# Patient Record
Sex: Female | Born: 1955
Health system: Southern US, Community
[De-identification: ages and names within clinical notes are randomized; demographics above are authoritative.]

## PROBLEM LIST (undated history)

## (undated) DIAGNOSIS — Z9189 Other specified personal risk factors, not elsewhere classified: Secondary | ICD-10-CM

## (undated) DIAGNOSIS — D126 Benign neoplasm of colon, unspecified: Secondary | ICD-10-CM

## (undated) DIAGNOSIS — M797 Fibromyalgia: Secondary | ICD-10-CM

## (undated) DIAGNOSIS — E559 Vitamin D deficiency, unspecified: Secondary | ICD-10-CM

## (undated) DIAGNOSIS — M5430 Sciatica, unspecified side: Secondary | ICD-10-CM

## (undated) DIAGNOSIS — M899 Disorder of bone, unspecified: Secondary | ICD-10-CM

## (undated) DIAGNOSIS — M949 Disorder of cartilage, unspecified: Secondary | ICD-10-CM

## (undated) HISTORY — DX: Sciatica, unspecified side: M54.30

## (undated) HISTORY — DX: Vitamin D deficiency, unspecified: E55.9

## (undated) HISTORY — DX: Fibromyalgia: M79.7

## (undated) HISTORY — DX: Benign neoplasm of colon, unspecified: D12.6

## (undated) HISTORY — DX: Disorder of bone, unspecified: M89.9

## (undated) HISTORY — DX: Disorder of cartilage, unspecified: M94.9

## (undated) HISTORY — DX: Other specified personal risk factors, not elsewhere classified: Z91.89

---
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
## 1998-12-18 ENCOUNTER — Other Ambulatory Visit: Admission: RE | Admit: 1998-12-18 | Discharge: 1998-12-18 | Payer: Self-pay | Admitting: Gynecology

## 2000-05-05 ENCOUNTER — Encounter: Payer: Self-pay | Admitting: Gynecology

## 2000-05-05 ENCOUNTER — Encounter: Admission: RE | Admit: 2000-05-05 | Discharge: 2000-05-05 | Payer: Self-pay | Admitting: Gynecology

## 2000-05-12 ENCOUNTER — Other Ambulatory Visit: Admission: RE | Admit: 2000-05-12 | Discharge: 2000-05-12 | Payer: Self-pay | Admitting: Gynecology

## 2001-06-21 ENCOUNTER — Other Ambulatory Visit: Admission: RE | Admit: 2001-06-21 | Discharge: 2001-06-21 | Payer: Self-pay | Admitting: Gynecology

## 2001-10-21 ENCOUNTER — Encounter: Admission: RE | Admit: 2001-10-21 | Discharge: 2001-10-21 | Payer: Self-pay | Admitting: Gynecology

## 2001-10-21 ENCOUNTER — Encounter: Payer: Self-pay | Admitting: Gynecology

## 2002-07-18 ENCOUNTER — Other Ambulatory Visit: Admission: RE | Admit: 2002-07-18 | Discharge: 2002-07-18 | Payer: Self-pay | Admitting: Gynecology

## 2002-10-24 ENCOUNTER — Encounter: Admission: RE | Admit: 2002-10-24 | Discharge: 2002-10-24 | Payer: Self-pay | Admitting: Gynecology

## 2002-10-24 ENCOUNTER — Encounter: Payer: Self-pay | Admitting: Gynecology

## 2004-04-28 HISTORY — PX: CERVICAL FUSION: SHX112

## 2004-05-30 ENCOUNTER — Other Ambulatory Visit: Admission: RE | Admit: 2004-05-30 | Discharge: 2004-05-30 | Payer: Self-pay | Admitting: Gynecology

## 2004-06-18 ENCOUNTER — Encounter: Admission: RE | Admit: 2004-06-18 | Discharge: 2004-06-18 | Payer: Self-pay | Admitting: Gynecology

## 2004-07-25 ENCOUNTER — Ambulatory Visit (HOSPITAL_COMMUNITY): Admission: RE | Admit: 2004-07-25 | Discharge: 2004-07-26 | Payer: Self-pay | Admitting: Orthopaedic Surgery

## 2005-07-03 ENCOUNTER — Other Ambulatory Visit: Admission: RE | Admit: 2005-07-03 | Discharge: 2005-07-03 | Payer: Self-pay | Admitting: Gynecology

## 2005-07-22 ENCOUNTER — Encounter: Admission: RE | Admit: 2005-07-22 | Discharge: 2005-07-22 | Payer: Self-pay | Admitting: Gynecology

## 2006-04-28 HISTORY — PX: OTHER SURGICAL HISTORY: SHX169

## 2006-07-20 ENCOUNTER — Other Ambulatory Visit: Admission: RE | Admit: 2006-07-20 | Discharge: 2006-07-20 | Payer: Self-pay | Admitting: Gynecology

## 2006-10-15 ENCOUNTER — Encounter: Admission: RE | Admit: 2006-10-15 | Discharge: 2006-10-15 | Payer: Self-pay | Admitting: Gynecology

## 2007-11-09 ENCOUNTER — Encounter: Admission: RE | Admit: 2007-11-09 | Discharge: 2007-11-09 | Payer: Self-pay | Admitting: Gynecology

## 2008-08-26 LAB — CONVERTED CEMR LAB: Pap Smear: NEGATIVE

## 2008-10-26 LAB — HM MAMMOGRAPHY: HM Mammogram: NORMAL

## 2008-11-27 ENCOUNTER — Encounter: Admission: RE | Admit: 2008-11-27 | Discharge: 2008-11-27 | Payer: Self-pay | Admitting: Gynecology

## 2008-11-29 ENCOUNTER — Encounter: Admission: RE | Admit: 2008-11-29 | Discharge: 2008-11-29 | Payer: Self-pay | Admitting: Gynecology

## 2009-01-12 ENCOUNTER — Ambulatory Visit: Payer: Self-pay | Admitting: Family Medicine

## 2009-08-09 ENCOUNTER — Ambulatory Visit: Payer: Self-pay | Admitting: Internal Medicine

## 2009-08-09 DIAGNOSIS — M5430 Sciatica, unspecified side: Secondary | ICD-10-CM

## 2009-08-09 DIAGNOSIS — Z9189 Other specified personal risk factors, not elsewhere classified: Secondary | ICD-10-CM | POA: Insufficient documentation

## 2009-08-09 DIAGNOSIS — M949 Disorder of cartilage, unspecified: Secondary | ICD-10-CM

## 2009-08-09 DIAGNOSIS — M899 Disorder of bone, unspecified: Secondary | ICD-10-CM | POA: Insufficient documentation

## 2009-08-09 HISTORY — DX: Sciatica, unspecified side: M54.30

## 2009-09-17 ENCOUNTER — Telehealth: Payer: Self-pay | Admitting: Internal Medicine

## 2009-09-18 ENCOUNTER — Telehealth: Payer: Self-pay | Admitting: Internal Medicine

## 2009-09-18 ENCOUNTER — Ambulatory Visit: Payer: Self-pay | Admitting: Internal Medicine

## 2009-09-19 ENCOUNTER — Ambulatory Visit: Payer: Self-pay | Admitting: Internal Medicine

## 2009-09-19 LAB — CONVERTED CEMR LAB
BUN: 15 mg/dL (ref 6–23)
Basophils Absolute: 0 10*3/uL (ref 0.0–0.1)
Basophils Relative: 0.6 % (ref 0.0–3.0)
CO2: 28 meq/L (ref 19–32)
CRP, High Sensitivity: 1.58 (ref 0.00–5.00)
Calcium: 9.1 mg/dL (ref 8.4–10.5)
Chloride: 105 meq/L (ref 96–112)
Cholesterol: 176 mg/dL (ref 0–200)
Creatinine, Ser: 0.7 mg/dL (ref 0.4–1.2)
Eosinophils Absolute: 0.1 10*3/uL (ref 0.0–0.7)
Eosinophils Relative: 1.8 % (ref 0.0–5.0)
GFR calc non Af Amer: 93.6 mL/min (ref >60)
Glucose, Bld: 74 mg/dL (ref 70–99)
HCT: 37.4 % (ref 36.0–46.0)
HDL: 81.1 mg/dL (ref >39.00)
Hemoglobin: 12.8 g/dL (ref 12.0–15.0)
LDL Cholesterol: 88 mg/dL (ref 0–99)
Lymphocytes Relative: 25.9 % (ref 12.0–46.0)
Lymphs Abs: 1.7 10*3/uL (ref 0.7–4.0)
MCHC: 34.3 g/dL (ref 30.0–36.0)
MCV: 91.7 fL (ref 78.0–100.0)
Monocytes Absolute: 0.6 10*3/uL (ref 0.1–1.0)
Monocytes Relative: 10.1 % (ref 3.0–12.0)
Neutro Abs: 3.9 10*3/uL (ref 1.4–7.7)
Neutrophils Relative %: 61.6 % (ref 43.0–77.0)
Platelets: 160 10*3/uL (ref 150.0–400.0)
Potassium: 4.1 meq/L (ref 3.5–5.1)
RBC: 4.08 M/uL (ref 3.87–5.11)
RDW: 13.9 % (ref 11.5–14.6)
Sodium: 140 meq/L (ref 135–145)
TSH: 2.31 microintl units/mL (ref 0.35–5.50)
Total CHOL/HDL Ratio: 2
Triglycerides: 34 mg/dL (ref 0.0–149.0)
VLDL: 6.8 mg/dL (ref 0.0–40.0)
WBC: 6.4 10*3/uL (ref 4.5–10.5)

## 2009-09-21 ENCOUNTER — Encounter (INDEPENDENT_AMBULATORY_CARE_PROVIDER_SITE_OTHER): Payer: Self-pay | Admitting: *Deleted

## 2009-10-24 ENCOUNTER — Encounter (INDEPENDENT_AMBULATORY_CARE_PROVIDER_SITE_OTHER): Payer: Self-pay | Admitting: *Deleted

## 2009-10-26 ENCOUNTER — Ambulatory Visit: Payer: Self-pay | Admitting: Internal Medicine

## 2009-11-09 ENCOUNTER — Ambulatory Visit: Payer: Self-pay | Admitting: Internal Medicine

## 2009-11-09 LAB — HM COLONOSCOPY

## 2009-11-12 ENCOUNTER — Encounter: Payer: Self-pay | Admitting: Internal Medicine

## 2009-12-03 ENCOUNTER — Encounter: Admission: RE | Admit: 2009-12-03 | Discharge: 2009-12-03 | Payer: Self-pay | Admitting: Gynecology

## 2009-12-13 ENCOUNTER — Telehealth: Payer: Self-pay | Admitting: Internal Medicine

## 2009-12-19 ENCOUNTER — Telehealth: Payer: Self-pay | Admitting: Internal Medicine

## 2010-01-15 ENCOUNTER — Encounter: Payer: Self-pay | Admitting: Internal Medicine

## 2010-03-05 ENCOUNTER — Telehealth: Payer: Self-pay | Admitting: Internal Medicine

## 2010-04-15 ENCOUNTER — Telehealth: Payer: Self-pay | Admitting: Internal Medicine

## 2010-05-09 ENCOUNTER — Ambulatory Visit
Admission: RE | Admit: 2010-05-09 | Discharge: 2010-05-09 | Payer: Self-pay | Source: Home / Self Care | Attending: Internal Medicine | Admitting: Internal Medicine

## 2010-05-13 ENCOUNTER — Encounter: Payer: Self-pay | Admitting: Internal Medicine

## 2010-05-19 ENCOUNTER — Encounter: Payer: Self-pay | Admitting: Gynecology

## 2010-05-20 ENCOUNTER — Encounter: Payer: Self-pay | Admitting: Gynecology

## 2010-05-28 NOTE — Letter (Signed)
Summary: Patient Notice- Polyp Results  [facility]  [address] [place], [place] [address]   Phone: [phone]  Fax: [phone]        November 12, 2009 MRN: [id number]    Kathy King [address] [place], [place]  [address]    Dear Ms. Bethea,  I am pleased to inform you that the colon polyp(s) removed during your recent colonoscopy was (were) found to be benign (no cancer detected) upon pathologic examination.The polyp was adenomatous ( precancerous)  I recommend you have a repeat colonoscopy examination in 5_ years to look for recurrent polyps, as having colon polyps increases your risk for having recurrent polyps or even colon cancer in the future.  Should you develop new or worsening symptoms of abdominal pain, bowel habit changes or bleeding from the rectum or bowels, please schedule an evaluation with either your primary care physician or with me.  Additional information/recommendations:  _x_ No further action with gastroenterology is needed at this time. Please      follow-up with your primary care physician for your other healthcare      needs.  __ Please call [phone] to schedule a return visit to review your      situation.  __ Please keep your follow-up visit as already scheduled.  __ Continue treatment plan as outlined the day of your exam.  Please call us if you are having persistent problems or have questions about your condition that have not been fully answered at this time.  Sincerely,  Hart Carwin MD  This letter has been electronically signed by your physician.  Appended Document: Patient Notice- Polyp Results letter mailed.

## 2010-05-28 NOTE — Procedures (Signed)
Summary: Colonoscopy  Patient: [name] Note: All result statuses are Final unless otherwise noted.  Tests: (1) Colonoscopy (COL)   COL Colonoscopy           DONE     [facility]     [address]     [place], [place]  [address]           COLONOSCOPY PROCEDURE REPORT           PATIENT:  Kathy King, Kathy King  MR#:  [id number]     BIRTHDATE:  1955/05/14, 53 yrs. old  GENDER:  female     ENDOSCOPIST:  Hedwig Morton. Juanda Chance, MD     REF. BY:  Rene Paci, M.D.     PROCEDURE DATE:  11/09/2009     PROCEDURE:  Colonoscopy [address]     ASA CLASS:  Class I     INDICATIONS:  Routine Risk Screening     MEDICATIONS:   Versed 12 mg, Fentanyl 125 mcg           DESCRIPTION OF PROCEDURE:   After the risks benefits and     alternatives of the procedure were thoroughly explained, informed     consent was obtained.  Digital rectal exam was performed and     revealed no rectal masses.   The LB CF-[id number] endoscope     was introduced through the anus and advanced to the cecum, which     was identified by both the appendix and ileocecal valve, without     limitations.  The quality of the prep was good, using MiraLax.     The instrument was then slowly withdrawn as the colon was fully     examined.     <<PROCEDUREIMAGES>>           FINDINGS:  A diminutive polyp was found. 3 mm sessile polyp at 25     cm The polyp was removed using cold biopsy forceps (see image6 and     image5).  This was otherwise a normal examination of the colon     (see image1, image2, image3, image4, and image7).   Retroflexed     views in the rectum revealed no abnormalities.    The scope was     then withdrawn from the patient and the procedure completed.           COMPLICATIONS:  None     ENDOSCOPIC IMPRESSION:     1) Diminutive polyp     2) Otherwise normal examination     RECOMMENDATIONS:     1) Await pathology results     2) High fiber diet.     REPEAT EXAM:  In 10 year(s) for.        ______________________________     Hedwig Morton. Juanda Chance, MD           CC:           n.     eSIGNED:   Hedwig Morton. Brodie at 11/09/2009 11:09 AM           Pianka, Angeleah, [place] [phone]  Note: An exclamation mark (!) indicates a result that was not dispersed into the flowsheet. Document Creation Date: 11/09/2009 11:10 AM _______________________________________________________________________  (1) Order result status: Final Collection or observation date-time: 11/09/2009 11:04 Requested date-time:  Receipt date-time:  Reported date-time:  Referring Physician:   Ordering Physician: Lina Sar [phone]) Specimen Source:  Source: Launa Grill Order Number: [id number] Lab site:   Appended Document: Colonoscopy  Procedures Next Due Date:    Colonoscopy: 10/2019  Appended Document: Colonoscopy     Procedures Next Due Date:    Colonoscopy: 10/2014

## 2010-05-28 NOTE — Progress Notes (Signed)
Summary: Sinus Inf?  Phone Note Call from Patient Call back at Work Phone [phone]   Caller: Patient Summary of Call: Pt c/o congestion for 1 1/2 weeks. The pt has been taking mucinex and benadryl and has had mild improvement but have with sinus headache and has seen some blood when blowing nose. The pt thinks she may have a sinus infection and is requesting [facility]X to [facility] Initial call taken by: Margaret Pyle, CMA,  March 05, 2010 9:06 AM  Follow-up for Phone Call        use Zpack - erx done Follow-up by: Newt Lukes MD,  March 05, 2010 9:56 AM  Additional Follow-up for Phone Call Additional follow up Details #1::        Pt informed Additional Follow-up by: Margaret Pyle, CMA,  March 05, 2010 9:58 AM    New/Updated Medications: AZITHROMYCIN 250 MG TABS (AZITHROMYCIN) 2 tabs by mouth today, then 1 by mouth daily starting tomorrow Prescriptions: AZITHROMYCIN 250 MG TABS (AZITHROMYCIN) 2 tabs by mouth today, then 1 by mouth daily starting tomorrow  #6 x 0   Entered and Authorized by:   Newt Lukes MD   Signed by:   Newt Lukes MD on 03/05/2010   Method used:   Electronically to        [facility] [phone][facility] (retail)       [address]       [place], [place]  [address]       Ph: [phone] or [phone]       Fax: [phone]   RxID:   [id number]

## 2010-05-28 NOTE — Miscellaneous (Signed)
Summary: [facility]  Clinical Lists Changes  Medications: Added new medication of MIRALAX   POWD (POLYETHYLENE GLYCOL 3350) As per prep  instructions. - Signed Added new medication of REGLAN 10 MG  TABS (METOCLOPRAMIDE HCL) As per prep instructions. - Signed Added new medication of DULCOLAX 5 MG  TBEC (BISACODYL) Day before procedure take 2 at 3pm and 2 at 8pm. - Signed Rx of MIRALAX   POWD (POLYETHYLENE GLYCOL 3350) As per prep  instructions.;  #255gm x 0;  Signed;  Entered by: Ezra Sites RN;  Authorized by: Hart Carwin MD;  Method used: Electronically to [facility] [address] [place], [place], [place]  [address], Ph: [phone], Fax: [phone] Rx of REGLAN 10 MG  TABS (METOCLOPRAMIDE HCL) As per prep instructions.;  #2 x 0;  Signed;  Entered by: Ezra Sites RN;  Authorized by: Hart Carwin MD;  Method used: Electronically to [facility] [address] [place], [place], [place]  [address], Ph: [phone], Fax: [phone] Rx of DULCOLAX 5 MG  TBEC (BISACODYL) Day before procedure take 2 at 3pm and 2 at 8pm.;  #4 x 0;  Signed;  Entered by: Ezra Sites RN;  Authorized by: Hart Carwin MD;  Method used: Electronically to [facility] [address] [place], [place], [place]  [address], Ph: [phone], Fax: [phone] Allergies: Added new allergy or adverse reaction of SULFA Observations: Added new observation of NKA: F (10/26/2009 12:45)    Prescriptions: DULCOLAX 5 MG  TBEC (BISACODYL) Day before procedure take 2 at 3pm and 2 at 8pm.  #4 x 0   Entered by:   Ezra Sites RN   Authorized by:   Hart Carwin MD   Signed by:   Ezra Sites RN on 10/26/2009   Method used:   Electronically to        [facility] (retail)       [address]       [place], [place]  [address]       Ph: [phone]       Fax: [phone]   RxID:   [id number] REGLAN 10 MG  TABS (METOCLOPRAMIDE HCL) As per prep  instructions.  #2 x 0   Entered by:   Ezra Sites RN   Authorized by:   Hart Carwin MD   Signed by:   Ezra Sites RN on 10/26/2009   Method used:   Electronically to        [facility] (retail)       [address]       [place], [place]  [address]       Ph: [phone]       Fax: [phone]   RxID:   [id number] MIRALAX   POWD (POLYETHYLENE GLYCOL 3350) As per prep  instructions.  #255gm x 0   Entered by:   Ezra Sites RN   Authorized by:   Hart Carwin MD   Signed by:   Ezra Sites RN on 10/26/2009   Method used:   Electronically to        [facility] (retail)       [address]       [place], [place]  [address]       Ph: [phone]  Fax: [phone]   RxID:   [id number]

## 2010-05-28 NOTE — Progress Notes (Signed)
Summary: pred pak and Pt refer  Phone Note Other Incoming   Summary of Call: xrays reviewed - no DDD or DJD evident in l-spine or left hip -  try pred pak and PT eval/tx for same - pt understands and agrees Initial call taken by: Newt Lukes MD,  Sep 18, 2009 4:56 PM    New/Updated Medications: PREDNISONE (PAK) 10 MG TABS (PREDNISONE) as directed x 6 days Prescriptions: PREDNISONE (PAK) 10 MG TABS (PREDNISONE) as directed x 6 days  #1 x 0   Entered and Authorized by:   Newt Lukes MD   Signed by:   Newt Lukes MD on 09/18/2009   Method used:   Electronically to        [facility] (retail)       [address]       [place], [place]  [address]       Ph: [phone]       Fax: [phone]   RxID:   [id number]

## 2010-05-28 NOTE — Letter (Signed)
Summary: [facility]  [facility]   Imported By: Lennie Odor 02/18/2010 11:20:21  _____________________________________________________________________  External Attachment:    Type:   Image     Comment:   External Document

## 2010-05-28 NOTE — Progress Notes (Signed)
Summary: labs  Phone Note Call from Patient   Caller: Patient Summary of Call: Have script from Dr. Nicholas Lose to have labs done. Pt is requesting to have labs done here. will fax results to Dr. Nicholas Lose [phone]. Per Dr. Felicity Coyer ok to put idx Initial call taken by: Orlan Leavens,  Sep 17, 2009 12:45 PM

## 2010-05-28 NOTE — Progress Notes (Signed)
Summary: Nsurg refer for hip-leg pain  Phone Note Call from Patient   Caller: Patient Reason for Call: Referral Summary of Call: Pt is requesting referral for hip Initial call taken by: Orlan Leavens RMA,  December 19, 2009 8:35 AM  Follow-up for Phone Call        noted - refer to Nsurg as discussed in person to help differentiate and tx hip vs back source of pain  Follow-up by: Newt Lukes MD,  December 19, 2009 9:40 AM

## 2010-05-28 NOTE — Assessment & Plan Note (Signed)
Summary: left hip pain/cd   Vital Signs:  Patient profile:   55 year old female Height:      64 inches (162.56 cm) Weight:      153 pounds (69.55 kg) BMI:     26.36 O2 Sat:      95 % on Room air Temp:     98.0 degrees F (36.67 degrees C) oral Pulse rate:   75 / minute BP sitting:   114 / 68  (left arm) Cuff size:   regular  Vitals Entered By: Orlan Leavens (August 09, 2009 10:52 AM)  O2 Flow:  Room air CC: New patient. Been having (L) hip pain x's 1 week, Lower Extremity Joint pain Is Patient Diabetic? No Pain Assessment Patient in pain? yes     Location: (L) Hip Type: dull Onset of pain  pt states been having pain for about a year, but since the last week has gotten worse   Primary Care Provider:  Newt Lukes MD  CC:  New patient. Been having (L) hip pain x's 1 week and Lower Extremity Joint pain.  History of Present Illness: new pt to me, here to est care -   Lower Extremity Joint Pain      This is a 55 year old woman who presents with Lower Extremity Joint pain.  The symptoms began 1 week ago.  The severity is described as moderate.  hx lift hip and lateral thigh/upper buttock pain x 12mo since getting dog (aggrevated by walking 50# dog), worse in past 1 week. Exac by mopping activity. Pain worset with sitting or leaning at waist to left side. Pain better with standing or walking. No pain lying on left hip in bed.  The patient reports popping and stiffness for >1 hr, but denies redness, giving away, decreased ROM, and weakness.  The pain is located in the left hip.  The pain began suddenly and with no injury.  The pain is described as dull, aching, and occuring at rest.  Evaluation to date has included no evaluation.  No relief of pain with 600mg  ibuprofen x 1 dose (casued stomach upset). The patient denies the following symptoms: fever and dysuria.    Preventive Screening-Counseling & Management  Alcohol-Tobacco     Alcohol drinks/day: 0     Alcohol Counseling: not  indicated; patient does not drink     Smoking Status: never     Tobacco Counseling: not indicated; no tobacco use  Caffeine-Diet-Exercise     Caffeine Counseling: not indicated; caffeine use is not excessive or problematic     Does Patient Exercise: yes     Type of exercise: walk     Depression Counseling: not indicated; screening negative for depression  Safety-Violence-Falls     Seat Belt Use: yes     Seat Belt Counseling: not indicated; patient wears seat belts     Helmet Use: yes     Helmet Counseling: not indicated; patient wears helmet when riding bicycle/motocycle     Firearms in the Home: no firearms in the home     Firearm Counseling: not applicable     Smoke Detectors: yes     Smoke Detector Counseling: n/a     Violence in the Home: no risk noted     Violence Counseling: not applicable     Fall Risk Counseling: not indicated; no significant falls noted  Clinical Review Panels:  Prevention   Last Mammogram:  No specific mammographic evidence of malignancy.   (10/26/2008)  Last Pap Smear:  Interpretation/Result:Negative for intraepithelial Lesion or Malignancy.    (08/26/2008)  Immunizations   Last Tetanus Booster:  Historical (04/28/2006)   Last Flu Vaccine:  Historical (01/26/2009)   Current Medications (verified): 1)  Vitamin D 2000 Unit Caps (Cholecalciferol) .... Take 1 By Mouth Qd 2)  Calcium 500 Mg Tabs (Calcium) .... 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    Physical Exam  General:  alert, well-developed, well-nourished, and cooperative to examination.    Lungs:  normal respiratory effort, no intercostal retractions or use of accessory muscles; normal breath sounds bilaterally - no crackles and no wheezes.    Heart:  normal rate, regular rhythm, no murmur, and no rub. BLE without edema. Msk:  back: full range of motion of lumbar spine. Nontender to palpation over vertebral region. Negative straight leg raise. Deep tendon reflexes symmetrically intact at Achilles and patella, negative clonus. Sensation intact throughout all dermatomes in bilateral lower extremities. Full strength to manual muscle testing in all major muscule groups including EHL, anterior tibialis, gastrocnemius, quadriceps, and iliopsoas. Able to heel and toe walk without difficulty and ambulates with a normal gait.  Pain to palpation over left buttock and piriformis region.   Impression & Recommendations:  Problem # 1:  SCIATICA, LEFT (ICD-724.3)  piriformis syndrome apparent on hx and exam - current intensity of symptoms present < 2 weeks - begin with NSAIDs and stretches - consider formal PT as needed  f/u if unimproved or worse to consider alt tx and eval - consider lumbar MRI if not improving  Her updated medication list for this problem includes:    Meloxicam 15 Mg Tabs (Meloxicam) .Marland Kitchen... 1 by mouth once daily x 14days, then as needed for pain  Discussed use of modified activities, medications, and stretching/strengthening exercises. Back care instructions given. To be seen in 2 weeks if no improvement; sooner if worsening of symptoms.   Orders: Prescription Created  Electronically [phone])  Problem # 2:  OSTEOPENIA (ICD-733.90)  Complete Medication List: 1)  Vitamin D 2000 Unit Caps (Cholecalciferol) .... Take 1 by mouth qd 2)  Calcium 500 Mg Tabs (Calcium) .... Take 1 by mouth qd 3)  Meloxicam 15 Mg Tabs (Meloxicam) .Marland Kitchen.. 1 by mouth once daily x 14days, then as needed for pain  Patient Instructions: 1)  it was good to see you today. 2)  meloxicam as discussed for pain - your prescription has been electronically submitted to your pharmacy. Please take as directed. Contact our office if you believe you're having problems with the medication(s).  3)  stretches as discussed to help with the tightness (will give PT handouts) Prescriptions: MELOXICAM 15 MG TABS (MELOXICAM) 1 by mouth once daily x 14days, then as needed for pain  #30 x 1  Entered and Authorized by:   Newt Lukes MD   Signed by:   Newt Lukes MD on 08/09/2009   Method used:   Electronically to        [facility] (retail)       [address]       [place], [place]  [address]       Ph: [phone]       Fax: [phone]   RxID:   [id number]    Pap Smear  Procedure date:  08/26/2008  Findings:      Interpretation/Result:Negative for intraepithelial Lesion or Malignancy.     Mammogram  Procedure date:  10/26/2008  Findings:      No specific mammographic evidence of malignancy.      Immunization History:  Tetanus/Td Immunization History:    Tetanus/Td:  historical (04/28/2006)  Influenza Immunization History:    Influenza:  historical (01/26/2009)

## 2010-05-28 NOTE — Letter (Signed)
Summary: Previsit letter  [facility]  [address] [place], [place] [address]   Phone: [phone]  Fax: [phone]       09/21/2009 MRN: [id number]  Kathy King [address] [place], [place]  [address]  Dear Kathy King,  Welcome to the Gastroenterology Division at [facility].    You are scheduled to see a nurse for your pre-procedure visit on 10-26-09 at 1pm on the 3rd floor at [facility], [address]  We ask that you try to arrive at our office 15 minutes prior to your appointment time to allow for check-in.  Your nurse visit will consist of discussing your medical and surgical history, your immediate family medical history, and your medications.    Please bring a complete list of all your medications or, if you prefer, bring the medication bottles and we will list them.  We will need to be aware of both prescribed and over the counter drugs.  We will need to know exact dosage information as well.  If you are on blood thinners (Coumadin, Plavix, Aggrenox, Ticlid, etc.) please call our office today/prior to your appointment, as we need to consult with your physician about holding your medication.   Please be prepared to read and sign documents such as consent forms, a financial agreement, and acknowledgement forms.  If necessary, and with your consent, a friend or relative is welcome to sit-in on the nurse visit with you.  Please bring your insurance card so that we may make a copy of it.  If your insurance requires a referral to see a specialist, please bring your referral form from your primary care physician.  No co-pay is required for this nurse visit.     If you cannot keep your appointment, please call [phone] to cancel or reschedule prior to your appointment date.  This allows [place] the opportunity to schedule an appointment for another patient in need of care.    Thank you for choosing [facility] for your medical needs.  We  appreciate the opportunity to care for you.  Please visit [place] at our website  to learn more about our practice.                     Sincerely.                                                                                                                   The Gastroenterology Division

## 2010-05-28 NOTE — Progress Notes (Signed)
Summary: LT hip pain  Phone Note Call from Patient Call back at Work Phone [phone]   Caller: Patient Summary of Call: Pt is requesting a Prednisone dose pack and possibly a Depo-Medrol injection. Pt is having pain in LT hip.  Initial call taken by: Margaret Pyle, CMA,  December 13, 2009 8:49 AM  Follow-up for Phone Call        pred pak - erx done Follow-up by: Newt Lukes MD,  December 13, 2009 12:05 PM    New/Updated Medications: PREDNISONE (PAK) 10 MG TABS (PREDNISONE) as directed x 6 days Prescriptions: PREDNISONE (PAK) 10 MG TABS (PREDNISONE) as directed x 6 days  #1 x 0   Entered by:   Brenton Grills MA   Authorized by:   Newt Lukes MD   Signed by:   Brenton Grills MA on 12/13/2009   Method used:   [facility]        [facility] [phone][facility]ail[facility]       [address]       [place], [place]  [address]       Ph: [phone] or [phone]       Fax: [phone]   RxID:   [id number] PREDNISONE (PAK) 10 MG TABS (PREDNISONE) as directed x 6 days  #1 x 0   Entered and Authorized by:   Newt Lukes MD   Signed by:   Newt Lukes MD on 12/13/2009   Method used:   Electronically to        [facility] (retail)       [address]       [place], [place]  [address]       Ph: [phone]       Fax: [phone]   RxID:   [id number]

## 2010-05-28 NOTE — Letter (Signed)
Summary: Miralax Instructions  [facility]  [address]   [place], [place] [address]   Phone: [phone]  Fax: [phone]       LAJEANA STROUGH    1955/08/24    MRN: [id number]       Procedure Day Dorna Bloom: Friday, 11-09-09     Arrival Time: 9:30 a.m.     Procedure Time: 10:30 a.m.     Location of Procedure:                    x   [facility] (4th Floor)   PREPARATION FOR COLONOSCOPY WITH MIRALAX  Starting 5 days prior to your procedure 11-04-09  do not eat nuts, seeds, popcorn, corn, beans, peas,  salads, or any raw vegetables.  Do not take any fiber supplements (e.g. Metamucil, Citrucel, and Benefiber). ____________________________________________________________________________________________________   THE DAY BEFORE YOUR PROCEDURE         DATE: 11-08-09  DAY: Thursday  1   Drink clear liquids the entire day-NO SOLID FOOD  2   Do not drink anything colored red or purple.  Avoid juices with pulp.  No orange juice.  3   Drink at least 64 oz. (8 glasses) of fluid/clear liquids during the day to prevent dehydration and help the prep work efficiently.  CLEAR LIQUIDS INCLUDE: Water Jello Ice Popsicles Tea (sugar ok, no milk/cream) Powdered fruit flavored drinks Coffee (sugar ok, no milk/cream) Gatorade Juice: apple, white grape, white cranberry  Lemonade Clear bullion, consomm, broth Carbonated beverages (any kind) Strained chicken noodle soup Hard Candy  4   Mix the entire bottle of Miralax with 64 oz. of Gatorade/Powerade in the morning and put in the refrigerator to chill.  5   At 3:00 pm take 2 Dulcolax/Bisacodyl tablets.  6   At 4:30 pm take one Reglan/Metoclopramide tablet.  7  Starting at 5:00 pm drink one 8 oz glass of the Miralax mixture every 15-20 minutes until you have finished drinking the entire 64 oz.  You should finish drinking prep around 7:30 or 8:00 pm.  8   If you are nauseated, you may take the 2nd Reglan/Metoclopramide  tablet at 6:30 pm.        9    At 8:00 pm take 2 more DULCOLAX/Bisacodyl tablets.     THE DAY OF YOUR PROCEDURE      DATE:   11-09-09  DAY: Thursday  You may drink clear liquids until 8:30 a.m.  (2 HOURS BEFORE PROCEDURE).   MEDICATION INSTRUCTIONS  Unless otherwise instructed, you should take regular prescription medications with a small sip of water as early as possible the morning of your procedure.           OTHER INSTRUCTIONS  You will need a responsible adult at least 53 years of age to accompany you and drive you home.   This person must remain in the waiting room during your procedure.  Wear loose fitting clothing that is easily removed.  Leave jewelry and other valuables at home.  However, you may wish to bring a book to read or an iPod/MP3 player to listen to music as you wait for your procedure to start.  Remove all body piercing jewelry and leave at home.  Total time from sign-in until discharge is approximately 2-3 hours.  You should go home directly after your procedure and rest.  You can resume normal activities the day after your procedure.  The day of your procedure you should not:  Drive   Make legal decisions   Operate machinery   Drink alcohol   Return to work  You will receive specific instructions about eating, activities and medications before you leave.   The above instructions have been reviewed and explained to me by   Ezra Sites RN  October 26, 2009 1:03 PM    I fully understand and can verbalize these instructions _____________________________ Date _______

## 2010-05-28 NOTE — Progress Notes (Signed)
Summary: left hip/back xrays  Phone Note Call from Patient   Summary of Call: continued left leg/hip/buttock pain - not improved after 30d meloxicam - ?xray --- ordered hip and l-spine to look for DJD or DDD Initial call taken by: Newt Lukes MD,  Sep 17, 2009 5:10 PM  Follow-up for Phone Call        pt aware. entered in idx Follow-up by: Orlan Leavens,  Sep 18, 2009 8:06 AM

## 2010-05-30 NOTE — Progress Notes (Signed)
Summary: Hip pain  Phone Note Call from Patient Call back at Home Phone [phone]   Caller: Patient Summary of Call: Pt c/o of intense LT hip pain that radiated down leg to knee and has have some numbness of her LT toes. Pt is requesting MD advisement, possible steroid injection and pred pak or PT. Initial call taken by: Margaret Pyle, CMA,  April 15, 2010 8:51 AM  Follow-up for Phone Call        pred pak erx done - if pt willing to go back for another PT visit, i will be happy to make referral - either to connie or [facility] PT, just let me know - thanks Follow-up by: Newt Lukes MD,  April 15, 2010 1:09 PM  Additional Follow-up for Phone Call Additional follow up Details #1::        Notified pt with md recommendations Additional Follow-up by: Orlan Leavens RMA,  April 15, 2010 1:28 PM    New/Updated Medications: PREDNISONE (PAK) 10 MG TABS (PREDNISONE) as directed x 6 days Prescriptions: PREDNISONE (PAK) 10 MG TABS (PREDNISONE) as directed x 6 days  #1 pak x 0   Entered and Authorized by:   Newt Lukes MD   Signed by:   Newt Lukes MD on 04/15/2010   Method used:   Electronically to        [facility] (retail)       [address]       [place], [place]  [address]       Ph: [phone]       Fax: [phone]   RxID:   [id number]   Appended Document: Hip pain - PT refer done pt also requests repeat PT eval - order placed - apt scheduled with connie 05/13/10 at 10am Newt Lukes MD  April 16, 2010 4:44 PM   Clinical Lists Changes  Orders: Added new Referral order of Physical Therapy Referral (PT) - Signed

## 2010-05-30 NOTE — Assessment & Plan Note (Signed)
Summary: ACU/R SHOULDER PAIN/JSS   Vital Signs:  Patient profile:   55 year old female Height:      64 inches (162.56 cm) Weight:      156 pounds (70.91 kg) BMI:     26.87 O2 Sat:      98 % on Room air Temp:     98.0 degrees F (36.67 degrees C) oral Pulse rate:   65 / minute BP sitting:   118 / 72  (left arm) Cuff size:   regular  Vitals Entered By: Orlan Leavens RMA (May 09, 2010 11:44 AM)  O2 Flow:  Room air CC: (R) shoulder pain Is Patient Diabetic? No Pain Assessment Patient in pain? yes     Location: (R) shoulder Type: aching   Primary Care Provider:  Newt Lukes MD  CC:  (R) shoulder pain.  History of Present Illness: c/o right shoudler and neck pain onset 48h ago progressive symptoms - unable to sleep at night due to pain no injury or overuse of neck of shoulder ++exac by recent stress  Current Medications (verified): 1)  Vitamin D 2000 Unit Caps (Cholecalciferol) .... Take 1 By Mouth Qd 2)  Calcium 500 Mg Tabs (Calcium) .... Take 1 By Mouth Qd 3)  Meloxicam 15 Mg Tabs (Meloxicam) .Marland Kitchen.. 1 By Mouth Once Daily X 14days, Then As Needed For Pain  Allergies (verified): 1)  ! Sulfa  Past History:  Past Medical History: osteopenia cervical spine DDD s/p fusion 2006  MD roster: gyn - [facility] -[facility] -[facility]  Review of Systems  The patient denies chest pain and headaches.    Physical Exam  General:  alert, well-developed, well-nourished, and cooperative to examination.    Msk:  myofascial spasm over right trap/supra scapular region, tender to palp - right shoulder: Full range of motion. full strength with testing rotator cuff. Negative impingement signs. Neurovascularly intact    Impression & Recommendations:  Problem # 1:  MUSCLE SPASM (ICD-728.85)  myofascial spasm over right trap - neck and shoulder pain resume meloxicam and take flexeril as needed - erx done  Orders: Prescription Created Electronically  [phone])  Complete Medication List: 1)  Vitamin D 2000 Unit Caps (Cholecalciferol) .... Take 1 by mouth qd 2)  Calcium 500 Mg Tabs (Calcium) .... Take 1 by mouth qd 3)  Meloxicam 15 Mg Tabs (Meloxicam) .Marland Kitchen.. 1 by mouth once daily x 14days, then as needed for pain 4)  Flexeril 10 Mg Tabs (Cyclobenzaprine hcl) .... 1/2-1 by mouth three times a day as needed for muscle spasm  Patient Instructions: 1)  it was good to see you today. 2)  pain is from muscle spasm - shoulder is ok 3)  use meloxicam as before and flexeril for muscle spasm (may cause sedation) - your prescriptions have been electronically submitted to your pharmacy. Please take as directed. Contact our office if you believe you're having problems with the medication(s).  Prescriptions: MELOXICAM 15 MG TABS (MELOXICAM) 1 by mouth once daily x 14days, then as needed for pain  #30 x 1   Entered and Authorized by:   Newt Lukes MD   Signed by:   Newt Lukes MD on 05/09/2010   Method used:   Electronically to        [facility] [phone][facility] (retail)       [address]       [place], [place]  [address]  Ph: [phone] or [phone]       Fax: [phone]   RxID:   [id number] FLEXERIL 10 MG TABS (CYCLOBENZAPRINE HCL) 1/2-1 by mouth three times a day as needed for muscle spasm  #30 x 1   Entered and Authorized by:   Newt Lukes MD   Signed by:   Newt Lukes MD on 05/09/2010   Method used:   Electronically to        [facility] [phone][facility] (retail)       [address]       [place], [place]  [address]       Ph: [phone] or [phone]       Fax: [phone]   RxID:   [id number]    Orders Added: 1)  Est. Patient Level III [557[address]] 2)  Prescription Created Electronically [phone]

## 2010-05-30 NOTE — Miscellaneous (Signed)
Summary: Evaluation/[facility]  Evaluation/[facility]   Imported By: Lester [place] 05/22/2010 09:27:34  _____________________________________________________________________  External Attachment:    Type:   Image     Comment:   External Document

## 2010-06-17 ENCOUNTER — Encounter: Payer: Self-pay | Admitting: Internal Medicine

## 2010-06-25 NOTE — Miscellaneous (Signed)
Summary: [facility]  D/C PT/[facility]   Imported By: Lester [place] 06/21/2010 08:31:41  _____________________________________________________________________  External Attachment:    Type:   Image     Comment:   External Document

## 2010-09-17 ENCOUNTER — Other Ambulatory Visit: Payer: Self-pay | Admitting: Gynecology

## 2010-11-14 ENCOUNTER — Other Ambulatory Visit: Payer: Self-pay | Admitting: *Deleted

## 2010-11-14 ENCOUNTER — Encounter: Payer: Self-pay | Admitting: Internal Medicine

## 2010-11-14 MED ORDER — CYCLOBENZAPRINE HCL 10 MG PO TABS: 10.0000 mg | ORAL_TABLET | Freq: Three times a day (TID) | ORAL | Status: DC | PRN

## 2010-11-14 MED ORDER — MELOXICAM 15 MG PO TABS: 15.0000 mg | ORAL_TABLET | Freq: Every day | ORAL | Status: DC | PRN

## 2010-11-21 ENCOUNTER — Other Ambulatory Visit: Payer: Self-pay | Admitting: Gynecology

## 2010-11-21 DIAGNOSIS — Z1231 Encounter for screening mammogram for malignant neoplasm of breast: Secondary | ICD-10-CM

## 2010-12-09 ENCOUNTER — Ambulatory Visit
Admission: RE | Admit: 2010-12-09 | Discharge: 2010-12-09 | Disposition: A | Discharge status: Home / Self Care | Payer: 59 | Source: Ambulatory Visit | Attending: Gynecology | Admitting: Gynecology

## 2010-12-09 DIAGNOSIS — Z1231 Encounter for screening mammogram for malignant neoplasm of breast: Secondary | ICD-10-CM

## 2011-02-24 ENCOUNTER — Other Ambulatory Visit: Payer: Self-pay | Admitting: Internal Medicine

## 2011-02-24 DIAGNOSIS — W57XXXA Bitten or stung by nonvenomous insect and other nonvenomous arthropods, initial encounter: Secondary | ICD-10-CM

## 2011-02-24 MED ORDER — DOXYCYCLINE HYCLATE 100 MG PO TABS: 100.0000 mg | ORAL_TABLET | Freq: Two times a day (BID) | ORAL | Status: DC

## 2011-02-24 NOTE — Progress Notes (Signed)
Removed infected tick from L flank 48h ago - requests empiric doxy - erx done

## 2011-02-26 ENCOUNTER — Ambulatory Visit (INDEPENDENT_AMBULATORY_CARE_PROVIDER_SITE_OTHER): Payer: 59 | Admitting: Internal Medicine

## 2011-02-26 ENCOUNTER — Encounter: Payer: Self-pay | Admitting: Internal Medicine

## 2011-02-26 VITALS — BP 112/72 | HR 68 | Temp 97.0°F | Ht 63.0 in

## 2011-02-26 DIAGNOSIS — J069 Acute upper respiratory infection, unspecified: Secondary | ICD-10-CM

## 2011-02-26 MED ORDER — BENZONATATE 100 MG PO CAPS: 100.0000 mg | ORAL_CAPSULE | Freq: Three times a day (TID) | ORAL | Status: DC | PRN

## 2011-02-26 MED ORDER — PROMETHAZINE-CODEINE 6.25-10 MG/5ML PO SYRP: 5.0000 mL | ORAL_SOLUTION | ORAL | Status: DC | PRN

## 2011-02-26 MED ORDER — IBUPROFEN 600 MG PO TABS: 600.0000 mg | ORAL_TABLET | Freq: Four times a day (QID) | ORAL | Status: AC | PRN

## 2011-02-26 NOTE — Patient Instructions (Signed)
It was good to see you today. If you develop worsening symptoms or fever, call and we can reconsider antibiotics, but it does not appear necessary to use additional antibiotics at this time. Ibuprofen 600mg  3x/day for next 72h, then as needed to help ache and fatigue- also use tessalon during daytime and prometh+cod syrup at night for cough symptoms - Your prescription(s) have been submitted to your pharmacy. Please take as directed and contact our office if you believe you are having problem(s) with the medication(s). Take claritin 10mg  qd x 7 days, then as needed for runny nose and congestion - (over the counter)

## 2011-02-26 NOTE — Progress Notes (Signed)
  Subjective:    Patient ID: Kathy King, female    DOB: [birth year], 55 y.o.   MRN: [id number]  HPI  complains of uri symptoms: head congestion, ear fullness and fatigue - progressed into cough Onset 4 days ago On doxy (empiric following tick bite 5 days ago) No fever, nonproductive sputum  Past Medical History  Diagnosis Date  . CHICKENPOX, HX OF   . OSTEOPENIA   . SCIATICA, LEFT 08/09/2009    s/p PT, NSAIDs    Review of Systems  Constitutional: Negative for fever and diaphoresis.  Respiratory: Negative for shortness of breath and wheezing.        Objective:   Physical Exam BP 112/72  Pulse 68  Temp(Src) 97 F (36.1 C) (Oral)  Ht 5\' 3"  (1.6 m)  SpO2 99% Constitutional: She appears fatigued and mildly ill, but well-developed and well-nourished.  HENT: Head: Normocephalic and atraumatic. Ears: B TMs ok, trace clear effusion; Nose: swollen turbinates, clear rhinnorhea. Mouth/Throat: Oropharynx is clear and moist. +PND, No oropharyngeal exudate.  Eyes: Conjunctivae and EOM are normal. Pupils are equal, round, and reactive to light. No scleral icterus.  Neck: Normal range of motion. Neck supple. No JVD present. No thyromegaly present.  Cardiovascular: Normal rate, regular rhythm and normal heart sounds.  No murmur heard. No BLE edema. Pulmonary/Chest: Effort normal and breath sounds normal. No respiratory distress. She has no wheezes. Psychiatric: She has a normal mood and affect. Her behavior is normal. Judgment and thought content normal.       Assessment & Plan:  URI with cough - no acute indication for antibiotics (and already on empiric doxy for tick bite) - symptomatic relief offered: ibuprofen, tessalon and prometh/cod syrup - rx done Hydrate, rest - to call if symptoms worse or unimporved

## 2011-06-19 ENCOUNTER — Other Ambulatory Visit: Payer: Self-pay | Admitting: *Deleted

## 2011-06-19 MED ORDER — CYCLOBENZAPRINE HCL 10 MG PO TABS: 10.0000 mg | ORAL_TABLET | Freq: Three times a day (TID) | ORAL | Status: DC | PRN

## 2011-06-19 MED ORDER — MELOXICAM 15 MG PO TABS: 15.0000 mg | ORAL_TABLET | Freq: Every day | ORAL | Status: DC

## 2011-06-19 NOTE — Telephone Encounter (Signed)
Requesting refill on her meloxicam & flexeril sent to [facility].... 06/19/11@4 :40am/LMB

## 2011-08-05 ENCOUNTER — Other Ambulatory Visit: Payer: Self-pay

## 2011-08-05 MED ORDER — LEVOFLOXACIN 750 MG PO TABS: 750.0000 mg | ORAL_TABLET | Freq: Every day | ORAL | Status: AC

## 2011-08-05 NOTE — Telephone Encounter (Signed)
Pt advised.

## 2011-08-05 NOTE — Telephone Encounter (Signed)
Levaquin 750 qd x 5 d

## 2011-08-05 NOTE — Telephone Encounter (Signed)
Pt called c/o of sinus HA and drainage, ear fullness and pain and cough. Pt is also complaining of UTI - flank pain and positive leukocytes on dip. Pt is requesting Rx that could possible cover both sinus infection and UTI, please advise.

## 2011-09-18 ENCOUNTER — Other Ambulatory Visit: Payer: Self-pay | Admitting: Gynecology

## 2011-10-22 ENCOUNTER — Ambulatory Visit (INDEPENDENT_AMBULATORY_CARE_PROVIDER_SITE_OTHER): Payer: 59 | Admitting: Internal Medicine

## 2011-10-22 ENCOUNTER — Encounter: Payer: Self-pay | Admitting: Internal Medicine

## 2011-10-22 VITALS — BP 118/72 | HR 82 | Temp 97.2°F | Ht 64.0 in | Wt 164.0 lb

## 2011-10-22 DIAGNOSIS — M5416 Radiculopathy, lumbar region: Secondary | ICD-10-CM | POA: Insufficient documentation

## 2011-10-22 DIAGNOSIS — IMO0002 Reserved for concepts with insufficient information to code with codable children: Secondary | ICD-10-CM

## 2011-10-22 DIAGNOSIS — M7062 Trochanteric bursitis, left hip: Secondary | ICD-10-CM | POA: Insufficient documentation

## 2011-10-22 DIAGNOSIS — M76899 Other specified enthesopathies of unspecified lower limb, excluding foot: Secondary | ICD-10-CM

## 2011-10-22 NOTE — Progress Notes (Signed)
  Subjective:    Patient ID: Kathy King, female    DOB: 28-Oct-1955, 56 y.o.   MRN: [id number]  HPI complains of L hip and leg pain Pain is 2 fold: 1: tender over lateral hip to any pressure or impact - ongoing >68mo - no p[recipiatting injury or overuse - not improved with PT, meloxicam, flexeril or ice packs  Also progressive dull ache sensation from L buttock to posterior thigh.  Pain radiates into L heel  Denies change in chronic low back pain  No falls or fever Not improved with PT exercises, meloxicam, flexeril or ice packs  Past Medical History  Diagnosis Date  . CHICKENPOX, HX OF   . OSTEOPENIA   . SCIATICA, LEFT 08/09/2009    s/p PT, NSAIDs   Review of Systems  Constitutional: Positive for fatigue. Negative for fever.  Genitourinary: Negative for hematuria and flank pain.  Musculoskeletal: Positive for back pain. Negative for myalgias, joint swelling and gait problem.       Objective:   Physical Exam BP 118/72  Pulse 82  Temp 97.2 F (36.2 C) (Oral)  Ht 5\' 4"  (1.626 m)  Wt 164 lb (74.39 kg)  BMI 28.15 kg/m2  SpO2 98% Wt Readings from Last 3 Encounters:  10/22/11 164 lb (74.39 kg)  05/09/10 156 lb (70.761 kg)  08/09/09 153 lb (69.4 kg)   Constitutional: She appears well-developed and well-nourished. No distress.  Musculoskeletal:  L hip tender over palpation of greater troch bursa - FROM and nonpainful internal/external rotation as well as flex/ext Back: full range of motion of thoracic and lumbar spine. Non tender to palpation. Positive ipsilateral (Left) straight leg raise. DTR's diminished L patella. Sensation intact in all dermatomes of the lower extremities. diminished strength of L foot flexor (anterior tibialis) to manual muscle testing. patient is unable to easily heel toe walk on left and ambulates with slightly antalgic gait. Skin: Skin is warm and dry. No rash noted. No erythema.  Psychiatric: She has a normal mood and affect. Her behavior is normal.  Judgment and thought content normal.    Procedure Note:  Greater trochanteric bursa injection the patient elects to proceed after verbal consent is obtained. the patient informed of possible risks and complications prior to procedure. Using sterile technique throughout, patient is injected with 1:3 DepoMedrol (40 mg):xylocaine into trochanteric bursa at site of maximal tenderness. the patient tolerated the procedure well. Ice 24-48h, heat thereafter as needed - instructions for aftercare provided.       Assessment & Plan:  See problem list. Medications and labs reviewed today.

## 2011-10-22 NOTE — Patient Instructions (Signed)
It was good to see you today. Your greater trochanteric bursa was injected today - place ice to this area over next 48h and gentle stretch (as per prior PT lessions) - avoid overuse for next 7 days Let us know if your leg and heel "ache" pain does not improve and we will proceed with MRI as suggested Hip Bursitis Bursitis is a swelling and soreness (inflammation) of a fluid-filled sac (bursa). This sac overlies and protects the joints.   CAUSES    Injury.   Overuse of the muscles surrounding the joint.   Arthritis.   Gout.   Infection.   Cold weather.   Inadequate warm-up and conditioning prior to activities.  The cause may not be known.   SYMPTOMS    Mild to severe irritation.   Tenderness and swelling over the outside of the hip.   Pain with motion of the hip.   If the bursa becomes infected, a fever may be present. Redness, tenderness, and warmth will develop over the hip.  Symptoms usually lessen in 3 to 4 weeks with treatment, but can come back. TREATMENT If conservative treatment does not work, your caregiver may advise draining the bursa and injecting cortisone into the area. This may speed up the healing process. This may also be used as an initial treatment of choice. HOME CARE INSTRUCTIONS    Apply ice to the affected area for 15 to 20 minutes every 3 to 4 hours while awake for the first 2 days. Put the ice in a plastic bag and place a towel between the bag of ice and your skin.   Rest the painful joint as much as possible, but continue to put the joint through a normal range of motion at least 4 times per day. When the pain lessens, begin normal, slow movements and usual activities to help prevent stiffness of the hip.   Only take over-the-counter or prescription medicines for pain, discomfort, or fever as directed by your caregiver.   Use crutches to limit weight bearing on the hip joint, if advised.   Elevate your painful hip to reduce swelling. Use pillows for  propping and cushioning your legs and hips.   Gentle massage may provide comfort and decrease swelling.  SEEK IMMEDIATE MEDICAL CARE IF:    Your pain increases even during treatment, or you are not improving.   You have a fever.   You have heat and inflammation over the involved bursa.   You have any other questions or concerns.  MAKE SURE YOU:    Understand these instructions.   Will watch your condition.   Will get help right away if you are not doing well or get worse.  Document Released: 10/04/2001 Document Revised: 04/03/2011 Document Reviewed: 05/03/2008 [facility] Patient Information 2012 [place], [place].Lumbosacral Radiculopathy Lumbosacral radiculopathy is a pinched nerve or nerves in the low back (lumbosacral area). When this happens you may have weakness in your legs and may not be able to stand on your toes. You may have pain going down into your legs. There may be difficulties with walking normally. There are many causes of this problem. Sometimes this may happen from an injury, or simply from arthritis or boney problems. It may also be caused by other illnesses such as diabetes. If there is no improvement after treatment, further studies may be done to find the exact cause. DIAGNOSIS   X-rays may be needed if the problems become long standing. Electromyograms may be done. This study is one in  which the working of nerves and muscles is studied. HOME CARE INSTRUCTIONS    Applications of ice packs may be helpful. Ice can be used in a plastic bag with a towel around it to prevent frostbite to skin. This may be used every 2 hours for 20 to 30 minutes, or as needed, while awake, or as directed by your caregiver.   Only take over-the-counter or prescription medicines for pain, discomfort, or fever as directed by your caregiver.   If physical therapy was prescribed, follow your caregiver's directions.  SEEK IMMEDIATE MEDICAL CARE IF:    You have pain not controlled with  medications.   You seem to be getting worse rather than better.   You develop increasing weakness in your legs.   You develop loss of bowel or bladder control.   You have difficulty with walking or balance, or develop clumsiness in the use of your legs.   You have a fever.  MAKE SURE YOU:    Understand these instructions.   Will watch your condition.   Will get help right away if you are not doing well or get worse.  Document Released: 04/14/2005 Document Revised: 04/03/2011 Document Reviewed: 12/03/2007 [facility] Patient Information 2012 [place], [place].

## 2011-10-22 NOTE — Assessment & Plan Note (Signed)
Long standing recurrent sciatica symptoms on left side -  Intermittent flares since 2011 New progressively worse pain symptoms in past 6-8 weeks Evidence of foot flexor weakness on exam Suggest MRI L spine to look for L L4-5 impingement -  pt will consider options and proceed with imaging an Nsurg eval if needed (ie, if not improved with injection of troch bursa as above) Continue PT, NSAIDs and muscle relaxer as ongoing

## 2011-10-22 NOTE — Assessment & Plan Note (Signed)
Long standing recurrent problem - clinically evident on exam in addition to lumbar radiculopathy - see next Injection today - see above continue PT exercises as previously learned and meloxicam

## 2011-11-18 ENCOUNTER — Other Ambulatory Visit: Payer: Self-pay | Admitting: Internal Medicine

## 2011-11-19 ENCOUNTER — Other Ambulatory Visit: Payer: Self-pay | Admitting: Gynecology

## 2011-11-19 DIAGNOSIS — Z1231 Encounter for screening mammogram for malignant neoplasm of breast: Secondary | ICD-10-CM

## 2011-12-11 ENCOUNTER — Encounter: Payer: Self-pay | Admitting: Internal Medicine

## 2011-12-11 ENCOUNTER — Ambulatory Visit: Payer: 59 | Admitting: Internal Medicine

## 2011-12-11 ENCOUNTER — Ambulatory Visit (INDEPENDENT_AMBULATORY_CARE_PROVIDER_SITE_OTHER): Payer: 59 | Admitting: Internal Medicine

## 2011-12-11 VITALS — BP 104/70 | HR 63 | Temp 96.6°F | Ht 64.0 in | Wt 165.0 lb

## 2011-12-11 DIAGNOSIS — J069 Acute upper respiratory infection, unspecified: Secondary | ICD-10-CM

## 2011-12-11 DIAGNOSIS — J9801 Acute bronchospasm: Secondary | ICD-10-CM

## 2011-12-11 MED ORDER — ALBUTEROL SULFATE HFA 108 (90 BASE) MCG/ACT IN AERS: 2.0000 | INHALATION_SPRAY | Freq: Four times a day (QID) | RESPIRATORY_TRACT | Status: DC | PRN

## 2011-12-11 MED ORDER — AZITHROMYCIN 250 MG PO TABS: ORAL_TABLET | ORAL | Status: AC

## 2011-12-11 MED ORDER — BENZONATATE 100 MG PO CAPS: 100.0000 mg | ORAL_CAPSULE | Freq: Three times a day (TID) | ORAL | Status: AC | PRN

## 2011-12-11 NOTE — Progress Notes (Signed)
  Subjective:    Patient ID: Kathy King, female    DOB: 1955-10-12, 56 y.o.   MRN: [id number]  HPI  complains of dry cough Onset 48h ago, progressive Exacerbated by conversation or exertion  Past Medical History  Diagnosis Date  . CHICKENPOX, HX OF   . OSTEOPENIA   . SCIATICA, LEFT 08/09/2009    s/p PT, NSAIDs    Review of Systems  Constitutional: Negative for fever and unexpected weight change.  Respiratory: Positive for chest tightness. Negative for shortness of breath and wheezing.   Cardiovascular: Negative for chest pain and leg swelling.       Objective:   Physical Exam BP 104/70  Pulse 63  Temp 96.6 F (35.9 C) (Oral)  Ht 5\' 4"  (1.626 m)  Wt 165 lb (74.844 kg)  BMI 28.32 kg/m2  SpO2 98% Wt Readings from Last 3 Encounters:  12/11/11 165 lb (74.844 kg)  10/22/11 164 lb (74.39 kg)  05/09/10 156 lb (70.761 kg)   Constitutional: She appears well-developed and well-nourished. No distress. dry cough HENT: Head: Normocephalic and atraumatic. Mouth/Throat: Oropharynx is clear and moist. No oropharyngeal exudate. no PND Eyes: Conjunctivae and EOM are normal. Pupils are equal, round, and reactive to light. No scleral icterus.  Neck: Normal range of motion. Neck supple. No JVD or LAD present. No thyromegaly present.  Cardiovascular: Normal rate, regular rhythm and normal heart sounds.  No murmur heard. No BLE edema. Pulmonary/Chest: Effort normal and breath sounds normal. No respiratory distress. She has no wheezes.     Lab Results  Component Value Date   WBC 6.4 09/19/2009   HGB 12.8 09/19/2009   HCT 37.4 09/19/2009   PLT 160.0 09/19/2009   GLUCOSE 74 09/19/2009   CHOL 176 09/19/2009   TRIG 34.0 09/19/2009   HDL 81.10 09/19/2009   LDLCALC 88 09/19/2009   NA 140 09/19/2009   K 4.1 09/19/2009   CL 105 09/19/2009   CREATININE 0.7 09/19/2009   BUN 15 09/19/2009   CO2 28 09/19/2009   TSH 2.31 09/19/2009       Assessment & Plan:  Acute URI, viral Cough - suspect viral  induced bronchospasm  prescribe prn Alb MDI and prn tessalon zpak to use if symptoms progress

## 2011-12-11 NOTE — Patient Instructions (Signed)
It was good to see you today. Use Ventolin inhaler every 4-6hours as needed for cough Also tessalon Perles 3x/day for cough If you develop worsening symptoms or fever, we can reconsider antibiotics, but it does not appear necessary to use antibiotics at this time. Zpak sent to your pharmacy to use if worsening symptoms or if unimproved in next 5 days

## 2011-12-12 ENCOUNTER — Telehealth: Payer: Self-pay | Admitting: *Deleted

## 2011-12-12 ENCOUNTER — Ambulatory Visit (INDEPENDENT_AMBULATORY_CARE_PROVIDER_SITE_OTHER): Payer: 59

## 2011-12-12 DIAGNOSIS — J45991 Cough variant asthma: Secondary | ICD-10-CM | POA: Insufficient documentation

## 2011-12-12 MED ORDER — METHYLPREDNISOLONE ACETATE 80 MG/ML IJ SUSP
120.0000 mg | Freq: Once | INTRAMUSCULAR | Status: AC
  Administered 2011-12-12: 120 mg via INTRAMUSCULAR

## 2011-12-12 NOTE — Telephone Encounter (Signed)
See nurse visit

## 2011-12-12 NOTE — Telephone Encounter (Signed)
Ok for Medrol shot

## 2011-12-12 NOTE — Telephone Encounter (Signed)
Pt is still having continuous cough and wants to know if MD would sent in Pred pack or give her steroid injection as discussed at yesterday's OV (whatever MD feels is best). Please advise.

## 2011-12-15 ENCOUNTER — Ambulatory Visit
Admission: RE | Admit: 2011-12-15 | Discharge: 2011-12-15 | Disposition: A | Discharge status: Home / Self Care | Payer: 59 | Source: Ambulatory Visit | Attending: Gynecology | Admitting: Gynecology

## 2011-12-15 DIAGNOSIS — Z1231 Encounter for screening mammogram for malignant neoplasm of breast: Secondary | ICD-10-CM

## 2011-12-18 ENCOUNTER — Other Ambulatory Visit: Payer: Self-pay | Admitting: Internal Medicine

## 2011-12-18 ENCOUNTER — Ambulatory Visit (INDEPENDENT_AMBULATORY_CARE_PROVIDER_SITE_OTHER)
Admission: RE | Admit: 2011-12-18 | Discharge: 2011-12-18 | Disposition: A | Discharge status: Home / Self Care | Payer: 59 | Source: Ambulatory Visit | Attending: Internal Medicine | Admitting: Internal Medicine

## 2011-12-18 DIAGNOSIS — R05 Cough: Secondary | ICD-10-CM

## 2011-12-18 DIAGNOSIS — R059 Cough, unspecified: Secondary | ICD-10-CM

## 2011-12-18 NOTE — Telephone Encounter (Signed)
Pt with persistent cough, malaise ; dr Felicity Coyer out of town;  Ok for cxr

## 2011-12-22 ENCOUNTER — Other Ambulatory Visit: Payer: Self-pay | Admitting: *Deleted

## 2011-12-22 MED ORDER — PROMETHAZINE-CODEINE 6.25-10 MG/5ML PO SYRP: 5.0000 mL | ORAL_SOLUTION | ORAL | Status: AC | PRN

## 2011-12-22 MED ORDER — DOXYCYCLINE HYCLATE 100 MG PO TABS: 100.0000 mg | ORAL_TABLET | Freq: Two times a day (BID) | ORAL | Status: AC

## 2011-12-22 NOTE — Telephone Encounter (Signed)
Pt has been notified with md recommendations... 12/22/11@9 :47am/LMB

## 2011-12-22 NOTE — Telephone Encounter (Signed)
Pt states she is still coughing, some wheezing. Had cxr last week results was normal. No head congestion, only chest congestion. Constant coughing esp at night. Keep sweating. Completed zpack did not help. Pls advise... 12/22/11@8 :16am/LMB

## 2011-12-22 NOTE — Telephone Encounter (Signed)
Doxy antibiotics and codeine cough syrup

## 2012-05-13 ENCOUNTER — Other Ambulatory Visit: Payer: Self-pay | Admitting: *Deleted

## 2012-05-13 MED ORDER — CYCLOBENZAPRINE HCL 10 MG PO TABS: 10.0000 mg | ORAL_TABLET | Freq: Three times a day (TID) | ORAL | Status: DC | PRN

## 2012-05-13 NOTE — Telephone Encounter (Signed)
Requesting refill on her flexeril...Kathy King

## 2012-05-28 ENCOUNTER — Other Ambulatory Visit: Payer: Self-pay | Admitting: *Deleted

## 2012-05-28 ENCOUNTER — Other Ambulatory Visit: Payer: Self-pay | Admitting: Internal Medicine

## 2012-05-28 MED ORDER — MELOXICAM 15 MG PO TABS: ORAL_TABLET | ORAL | Status: DC

## 2012-05-28 NOTE — Telephone Encounter (Signed)
Pt would like 90 day supply of Meloxicam.

## 2012-05-29 LAB — HM PAP SMEAR

## 2012-08-09 ENCOUNTER — Other Ambulatory Visit: Payer: Self-pay | Admitting: Internal Medicine

## 2012-11-09 ENCOUNTER — Other Ambulatory Visit: Payer: Self-pay

## 2012-11-09 DIAGNOSIS — Z1231 Encounter for screening mammogram for malignant neoplasm of breast: Secondary | ICD-10-CM

## 2012-12-16 ENCOUNTER — Other Ambulatory Visit: Payer: Self-pay | Admitting: *Deleted

## 2012-12-16 MED ORDER — MELOXICAM 15 MG PO TABS: ORAL_TABLET | ORAL | Status: DC

## 2012-12-16 NOTE — Telephone Encounter (Signed)
Requesting refills on her meloxicam...lmb

## 2012-12-20 ENCOUNTER — Ambulatory Visit
Admission: RE | Admit: 2012-12-20 | Discharge: 2012-12-20 | Disposition: A | Discharge status: Home / Self Care | Payer: 59 | Source: Ambulatory Visit

## 2012-12-20 DIAGNOSIS — Z1231 Encounter for screening mammogram for malignant neoplasm of breast: Secondary | ICD-10-CM

## 2012-12-24 ENCOUNTER — Encounter: Payer: Self-pay | Admitting: Internal Medicine

## 2012-12-24 ENCOUNTER — Ambulatory Visit (INDEPENDENT_AMBULATORY_CARE_PROVIDER_SITE_OTHER): Payer: 59 | Admitting: Internal Medicine

## 2012-12-24 VITALS — BP 124/82 | HR 73 | Temp 98.2°F | Wt 168.6 lb

## 2012-12-24 DIAGNOSIS — J020 Streptococcal pharyngitis: Secondary | ICD-10-CM

## 2012-12-24 DIAGNOSIS — Z0000 Encounter for general adult medical examination without abnormal findings: Secondary | ICD-10-CM

## 2012-12-24 DIAGNOSIS — E559 Vitamin D deficiency, unspecified: Secondary | ICD-10-CM | POA: Insufficient documentation

## 2012-12-24 MED ORDER — AMOXICILLIN 500 MG PO CAPS: 500.0000 mg | ORAL_CAPSULE | Freq: Three times a day (TID) | ORAL | Status: DC

## 2012-12-24 NOTE — Progress Notes (Signed)
  Subjective:    Patient ID: Kathy King, female    DOB: June 24, 1955, 57 y.o.   MRN: [id number]  Sore Throat  This is a new problem. The current episode started in the past 7 days. The problem has been gradually worsening. The pain is moderate. Pertinent negatives include no abdominal pain, congestion, coughing, ear discharge, headaches, hoarse voice, shortness of breath, trouble swallowing or vomiting. She has had no exposure to mono. Strep: ? She has tried cool liquids, acetaminophen and gargles for the symptoms. The treatment provided no relief.    Also patient is here today for annual physical. Needs labs - follows with gyn   Past Medical History  Diagnosis Date  . CHICKENPOX, HX OF   . OSTEOPENIA   . SCIATICA, LEFT 08/09/2009    s/p PT, NSAIDs   Family History  Problem Relation Age of Onset  . Arthritis Other     grandparent  . Breast cancer Other     grandmother   History  Substance Use Topics  . Smoking status: Never Smoker   . Smokeless tobacco: Not on file     Comment: Married, lives with spouse and kids  . Alcohol Use: No    Review of Systems  Constitutional: Negative for fever and unexpected weight change.  HENT: Negative for congestion, hoarse voice, trouble swallowing and ear discharge.   Respiratory: Positive for chest tightness. Negative for cough, shortness of breath and wheezing.   Cardiovascular: Negative for chest pain and leg swelling.  Gastrointestinal: Negative for vomiting and abdominal pain.  Neurological: Negative for headaches.       Objective:   Physical Exam BP 124/82  Pulse 73  Temp(Src) 98.2 F (36.8 C) (Oral)  Wt 168 lb 9.6 oz (76.476 kg)  BMI 28.93 kg/m2  SpO2 99% Wt Readings from Last 3 Encounters:  12/24/12 168 lb 9.6 oz (76.476 kg)  12/11/11 165 lb (74.844 kg)  10/22/11 164 lb (74.39 kg)   Constitutional: She appears well-developed and well-nourished. No distress. dry cough HENT: Head: Normocephalic and atraumatic.  Mouth/Throat: Oropharynx is clear and moist. Bright erythema on right side -?oropharyngeal exudate. no PND Eyes: Conjunctivae and EOM are normal. Pupils are equal, round, and reactive to light. No scleral icterus.  Neck: Normal range of motion. Neck supple. No JVD or LAD present. No thyromegaly present.  Cardiovascular: Normal rate, regular rhythm and normal heart sounds.  No murmur heard. No BLE edema. Pulmonary/Chest: Effort normal and breath sounds normal. No respiratory distress. She has no wheezes.     Lab Results  Component Value Date   WBC 6.4 09/19/2009   HGB 12.8 09/19/2009   HCT 37.4 09/19/2009   PLT 160.0 09/19/2009   GLUCOSE 74 09/19/2009   CHOL 176 09/19/2009   TRIG 34.0 09/19/2009   HDL 81.10 09/19/2009   LDLCALC 88 09/19/2009   NA 140 09/19/2009   K 4.1 09/19/2009   CL 105 09/19/2009   CREATININE 0.7 09/19/2009   BUN 15 09/19/2009   CO2 28 09/19/2009   TSH 2.31 09/19/2009       Assessment & Plan:   Acute exudative pharyngitis - high risk for strep given exposure for same amox x 10d,  Alt tylenol/ibuprofen, salt gargle symptomatic care - to call if worse or unimproved  CPX/v70.0 - Patient has been counseled on age-appropriate routine health concerns for screening and prevention. These are reviewed and up-to-date. Immunizations are up-to-date or declined. Labs ordered and reviewed.

## 2012-12-24 NOTE — Patient Instructions (Addendum)
It was good to see you today. We have reviewed your prior records including labs and tests today Health Maintenance reviewed - all recommended immunizations and age-appropriate screenings are up-to-date. Test(s) ordered today. Your results will be released to MyChart (or called to you) after review, usually within 72hours after test completion. If any changes need to be made, you will be notified at that same time. Medications reviewed and updated Amox antibiotics x 10days for strep -no other changes recommended at this time. Your prescription(s) have been submitted to your pharmacy. Please take as directed and contact our office if you believe you are having problem(s) with the medication(s). Alternate between ibuprofen and tylenol for aches, pain and fever symptoms as discussed Please schedule followup in 1-2 years for annual exam, call sooner if problems.  Health Maintenance, Females A healthy lifestyle and preventative care can promote health and wellness.  Maintain regular health, dental, and eye exams.  Eat a healthy diet. Foods like vegetables, fruits, whole grains, low-fat dairy products, and lean protein foods contain the nutrients you need without too many calories. Decrease your intake of foods high in solid fats, added sugars, and salt. Get information about a proper diet from your caregiver, if necessary.  Regular physical exercise is one of the most important things you can do for your health. Most adults should get at least 150 minutes of moderate-intensity exercise (any activity that increases your heart rate and causes you to sweat) each week. In addition, most adults need muscle-strengthening exercises on 2 or more days a week.   Maintain a healthy weight. The body mass index (BMI) is a screening tool to identify possible weight problems. It provides an estimate of body fat based on height and weight. Your caregiver can help determine your BMI, and can help you achieve or  maintain a healthy weight. For adults 20 years and older:  A BMI below 18.5 is considered underweight.  A BMI of 18.5 to 24.9 is normal.  A BMI of 25 to 29.9 is considered overweight.  A BMI of 30 and above is considered obese.  Maintain normal blood lipids and cholesterol by exercising and minimizing your intake of saturated fat. Eat a balanced diet with plenty of fruits and vegetables. Blood tests for lipids and cholesterol should begin at age 36 and be repeated every 5 years. If your lipid or cholesterol levels are high, you are over 50, or you are a high risk for heart disease, you may need your cholesterol levels checked more frequently.Ongoing high lipid and cholesterol levels should be treated with medicines if diet and exercise are not effective.  If you smoke, find out from your caregiver how to quit. If you do not use tobacco, do not start.  If you are pregnant, do not drink alcohol. If you are breastfeeding, be very cautious about drinking alcohol. If you are not pregnant and choose to drink alcohol, do not exceed 1 drink per day. One drink is considered to be 12 ounces (355 mL) of beer, 5 ounces (148 mL) of wine, or 1.5 ounces (44 mL) of liquor.  Avoid use of street drugs. Do not share needles with anyone. Ask for help if you need support or instructions about stopping the use of drugs.  High blood pressure causes heart disease and increases the risk of stroke. Blood pressure should be checked at least every 1 to 2 years. Ongoing high blood pressure should be treated with medicines, if weight loss and exercise are not effective.  If you are 66 to 55 years old, ask your caregiver if you should take aspirin to prevent strokes.  Diabetes screening involves taking a blood sample to check your fasting blood sugar level. This should be done once every 3 years, after age 67, if you are within normal weight and without risk factors for diabetes. Testing should be considered at a younger  age or be carried out more frequently if you are overweight and have at least 1 risk factor for diabetes.  Breast cancer screening is essential preventative care for women. You should practice "breast self-awareness." This means understanding the normal appearance and feel of your breasts and may include breast self-examination. Any changes detected, no matter how small, should be reported to a caregiver. Women in their 85s and 30s should have a clinical breast exam (CBE) by a caregiver as part of a regular health exam every 1 to 3 years. After age 80, women should have a CBE every year. Starting at age 28, women should consider having a mammogram (breast X-ray) every year. Women who have a family history of breast cancer should talk to their caregiver about genetic screening. Women at a high risk of breast cancer should talk to their caregiver about having an MRI and a mammogram every year.  The Pap test is a screening test for cervical cancer. Women should have a Pap test starting at age 17. Between ages 35 and 58, Pap tests should be repeated every 2 years. Beginning at age 66, you should have a Pap test every 3 years as long as the past 3 Pap tests have been normal. If you had a hysterectomy for a problem that was not cancer or a condition that could lead to cancer, then you no longer need Pap tests. If you are between ages 8 and 42, and you have had normal Pap tests going back 10 years, you no longer need Pap tests. If you have had past treatment for cervical cancer or a condition that could lead to cancer, you need Pap tests and screening for cancer for at least 20 years after your treatment. If Pap tests have been discontinued, risk factors (such as a new sexual partner) need to be reassessed to determine if screening should be resumed. Some women have medical problems that increase the chance of getting cervical cancer. In these cases, your caregiver may recommend more frequent screening and Pap  tests.  The human papillomavirus (HPV) test is an additional test that may be used for cervical cancer screening. The HPV test looks for the virus that can cause the cell changes on the cervix. The cells collected during the Pap test can be tested for HPV. The HPV test could be used to screen women aged 30 years and older, and should be used in women of any age who have unclear Pap test results. After the age of 12, women should have HPV testing at the same frequency as a Pap test.  Colorectal cancer can be detected and often prevented. Most routine colorectal cancer screening begins at the age of 45 and continues through age 30. However, your caregiver may recommend screening at an earlier age if you have risk factors for colon cancer. On a yearly basis, your caregiver may provide home test kits to check for hidden blood in the stool. Use of a small camera at the end of a tube, to directly examine the colon (sigmoidoscopy or colonoscopy), can detect the earliest forms of colorectal cancer. Talk to your  caregiver about this at age 73, when routine screening begins. Direct examination of the colon should be repeated every 5 to 10 years through age 81, unless early forms of pre-cancerous polyps or small growths are found.  Hepatitis C blood testing is recommended for all people born from [birth year] through 1963 and any individual with known risks for hepatitis C.  Practice safe sex. Use condoms and avoid high-risk sexual practices to reduce the spread of sexually transmitted infections (STIs). Sexually active women aged 53 and younger should be checked for Chlamydia, which is a common sexually transmitted infection. Older women with new or multiple partners should also be tested for Chlamydia. Testing for other STIs is recommended if you are sexually active and at increased risk.  Osteoporosis is a disease in which the bones lose minerals and strength with aging. This can result in serious bone fractures. The risk  of osteoporosis can be identified using a bone density scan. Women ages 47 and over and women at risk for fractures or osteoporosis should discuss screening with their caregivers. Ask your caregiver whether you should be taking a calcium supplement or vitamin D to reduce the rate of osteoporosis.  Menopause can be associated with physical symptoms and risks. Hormone replacement therapy is available to decrease symptoms and risks. You should talk to your caregiver about whether hormone replacement therapy is right for you.  Use sunscreen with a sun protection factor (SPF) of 30 or greater. Apply sunscreen liberally and repeatedly throughout the day. You should seek shade when your shadow is shorter than you. Protect yourself by wearing long sleeves, pants, a wide-brimmed hat, and sunglasses year round, whenever you are outdoors.  Notify your caregiver of new moles or changes in moles, especially if there is a change in shape or color. Also notify your caregiver if a mole is larger than the size of a pencil eraser.  Stay current with your immunizations. Document Released: 10/28/2010 Document Revised: 07/07/2011 Document Reviewed: 10/28/2010 [facility] Patient Information 2014 [place], [place]. Strep Throat Strep throat is an infection of the throat caused by a bacteria named Streptococcus pyogenes. Your caregiver may call the infection streptococcal "tonsillitis" or "pharyngitis" depending on whether there are signs of inflammation in the tonsils or back of the throat. Strep throat is most common in children aged 5 15 years during the cold months of the year, but it can occur in people of any age during any season. This infection is spread from person to person (contagious) through coughing, sneezing, or other close contact. SYMPTOMS   Fever or chills.  Painful, swollen, red tonsils or throat.  Pain or difficulty when swallowing.  White or yellow spots on the tonsils or throat.  Swollen, tender  lymph nodes or "glands" of the neck or under the jaw.  Red rash all over the body (rare). DIAGNOSIS  Many different infections can cause the same symptoms. A test must be done to confirm the diagnosis so the right treatment can be given. A "rapid strep test" can help your caregiver make the diagnosis in a few minutes. If this test is not available, a light swab of the infected area can be used for a throat culture test. If a throat culture test is done, results are usually available in a day or two. TREATMENT  Strep throat is treated with antibiotic medicine. HOME CARE INSTRUCTIONS   Gargle with 1 tsp of salt in 1 cup of warm water, 3 4 times per day or as needed for comfort.  Family members who also have a sore throat or fever should be tested for strep throat and treated with antibiotics if they have the strep infection.  Make sure everyone in your household washes their hands well.  Do not share food, drinking cups, or personal items that could cause the infection to spread to others.  You may need to eat a soft food diet until your sore throat gets better.  Drink enough water and fluids to keep your urine clear or pale yellow. This will help prevent dehydration.  Get plenty of rest.  Stay home from school, daycare, or work until you have been on antibiotics for 24 hours.  Only take over-the-counter or prescription medicines for pain, discomfort, or fever as directed by your caregiver.  If antibiotics are prescribed, take them as directed. Finish them even if you start to feel better. SEEK MEDICAL CARE IF:   The glands in your neck continue to enlarge.  You develop a rash, cough, or earache.  You cough up green, yellow-brown, or bloody sputum.  You have pain or discomfort not controlled by medicines.  Your problems seem to be getting worse rather than better. SEEK IMMEDIATE MEDICAL CARE IF:   You develop any new symptoms such as vomiting, severe headache, stiff or painful  neck, chest pain, shortness of breath, or trouble swallowing.  You develop severe throat pain, drooling, or changes in your voice.  You develop swelling of the neck, or the skin on the neck becomes red and tender.  You have a fever.  You develop signs of dehydration, such as fatigue, dry mouth, and decreased urination.  You become increasingly sleepy, or you cannot wake up completely. Document Released: 04/11/2000 Document Revised: 03/31/2012 Document Reviewed: 06/13/2010 [facility] Patient Information 2014 [place], [place].

## 2013-01-07 ENCOUNTER — Other Ambulatory Visit (INDEPENDENT_AMBULATORY_CARE_PROVIDER_SITE_OTHER): Payer: 59

## 2013-01-07 DIAGNOSIS — Z0000 Encounter for general adult medical examination without abnormal findings: Secondary | ICD-10-CM

## 2013-01-07 LAB — CBC WITH DIFFERENTIAL/PLATELET
Basophils Absolute: 0 10*3/uL (ref 0.0–0.1)
Basophils Relative: 0.5 % (ref 0.0–3.0)
Eosinophils Absolute: 0.1 10*3/uL (ref 0.0–0.7)
Eosinophils Relative: 2.5 % (ref 0.0–5.0)
HCT: 37.9 % (ref 36.0–46.0)
Hemoglobin: 12.7 g/dL (ref 12.0–15.0)
Lymphocytes Relative: 32 % (ref 12.0–46.0)
Lymphs Abs: 1.8 10*3/uL (ref 0.7–4.0)
MCHC: 33.4 g/dL (ref 30.0–36.0)
MCV: 88.9 fl (ref 78.0–100.0)
Monocytes Absolute: 0.6 10*3/uL (ref 0.1–1.0)
Monocytes Relative: 11.2 % (ref 3.0–12.0)
Neutro Abs: 3.1 10*3/uL (ref 1.4–7.7)
Neutrophils Relative %: 53.8 % (ref 43.0–77.0)
Platelets: 190 10*3/uL (ref 150.0–400.0)
RBC: 4.27 Mil/uL (ref 3.87–5.11)
RDW: 13.7 % (ref 11.5–14.6)
WBC: 5.8 10*3/uL (ref 4.5–10.5)

## 2013-01-07 LAB — LIPID PANEL
Cholesterol: 176 mg/dL (ref 0–200)
HDL: 71.7 mg/dL (ref >39.00)
LDL Cholesterol: 95 mg/dL (ref 0–99)
Total CHOL/HDL Ratio: 2
Triglycerides: 47 mg/dL (ref 0.0–149.0)
VLDL: 9.4 mg/dL (ref 0.0–40.0)

## 2013-01-07 LAB — BASIC METABOLIC PANEL
BUN: 15 mg/dL (ref 6–23)
CO2: 28 mEq/L (ref 19–32)
Calcium: 9.1 mg/dL (ref 8.4–10.5)
Chloride: 106 mEq/L (ref 96–112)
Creatinine, Ser: 0.7 mg/dL (ref 0.4–1.2)
GFR: 92.43 mL/min (ref >60.00)
Glucose, Bld: 85 mg/dL (ref 70–99)
Potassium: 4.3 mEq/L (ref 3.5–5.1)
Sodium: 139 mEq/L (ref 135–145)

## 2013-01-07 LAB — URINALYSIS, ROUTINE W REFLEX MICROSCOPIC
Bilirubin Urine: NEGATIVE
Hgb urine dipstick: NEGATIVE
Ketones, ur: NEGATIVE
Leukocytes, UA: NEGATIVE
Nitrite: NEGATIVE
RBC / HPF: NONE SEEN (ref 0–?)
Specific Gravity, Urine: 1.015 (ref 1.000–1.030)
Total Protein, Urine: NEGATIVE
Urine Glucose: NEGATIVE
Urobilinogen, UA: 0.2 (ref 0.0–1.0)
WBC, UA: NONE SEEN (ref 0–?)
pH: 7.5 (ref 5.0–8.0)

## 2013-01-07 LAB — TSH: TSH: 3.22 u[IU]/mL (ref 0.35–5.50)

## 2013-01-07 LAB — HEPATIC FUNCTION PANEL
ALT: 25 U/L (ref 0–35)
AST: 24 U/L (ref 0–37)
Albumin: 4 g/dL (ref 3.5–5.2)
Alkaline Phosphatase: 57 U/L (ref 39–117)
Bilirubin, Direct: 0.1 mg/dL (ref 0.0–0.3)
Total Bilirubin: 0.7 mg/dL (ref 0.3–1.2)
Total Protein: 6.2 g/dL (ref 6.0–8.3)

## 2013-01-08 LAB — VITAMIN D 25 HYDROXY (VIT D DEFICIENCY, FRACTURES): Vit D, 25-Hydroxy: 57 ng/mL (ref 30–89)

## 2013-02-04 ENCOUNTER — Other Ambulatory Visit: Payer: Self-pay | Admitting: Internal Medicine

## 2013-03-18 ENCOUNTER — Ambulatory Visit (INDEPENDENT_AMBULATORY_CARE_PROVIDER_SITE_OTHER): Payer: 59 | Admitting: Internal Medicine

## 2013-03-18 ENCOUNTER — Encounter: Payer: Self-pay | Admitting: Internal Medicine

## 2013-03-18 VITALS — BP 130/78 | HR 77 | Temp 97.0°F | Wt 171.6 lb

## 2013-03-18 DIAGNOSIS — J309 Allergic rhinitis, unspecified: Secondary | ICD-10-CM

## 2013-03-18 DIAGNOSIS — H6983 Other specified disorders of Eustachian tube, bilateral: Secondary | ICD-10-CM

## 2013-03-18 DIAGNOSIS — H6980 Other specified disorders of Eustachian tube, unspecified ear: Secondary | ICD-10-CM

## 2013-03-18 MED ORDER — FLUTICASONE PROPIONATE 50 MCG/ACT NA SUSP: 1.0000 | Freq: Every day | NASAL | Status: DC

## 2013-03-18 MED ORDER — PREDNISONE (PAK) 10 MG PO TABS: ORAL_TABLET | ORAL | Status: DC

## 2013-03-18 MED ORDER — LORATADINE 10 MG PO TABS: 10.0000 mg | ORAL_TABLET | Freq: Every day | ORAL | Status: DC | PRN

## 2013-03-18 NOTE — Progress Notes (Signed)
  Subjective:    HPI  complains of head congestion Associated with sore throat, ear fullness/popping, postnasal drainage and cough Symptoms min improved with Mucinex  Past Medical History  Diagnosis Date  . CHICKENPOX, HX OF   . OSTEOPENIA   . SCIATICA, LEFT 08/09/2009    s/p PT, NSAIDs  . Unspecified vitamin D deficiency     Review of Systems Constitutional: No fever or night sweats, no unexpected weight change Pulmonary: No pleurisy or hemoptysis Cardiovascular: No chest pain or palpitations     Objective:   Physical Exam BP 138/88  Pulse 77  Temp(Src) 97 F (36.1 C) (Oral)  Wt 171 lb 9.6 oz (77.837 kg)  SpO2 97% GEN: nontoxic appearing and audible head congestion HENT: NCAT, mild max sinus tenderness bilaterally, nares with Caryn Bee and swelling left greater than right side, clear discharge, oropharynx mild erythema, no exudate -evidence for cobblestoning and postnasal drip. Tympanic membranes with trace to moderate clear effusion bilaterally, no erythema or cerumen impaction Eyes: Vision grossly intact, no conjunctivitis Lungs: Clear to auscultation without rhonchi or wheeze, no increased work of breathing Cardiovascular: Regular rate and rhythm, no bilateral edema  Lab Results  Component Value Date   WBC 5.8 01/07/2013   HGB 12.7 01/07/2013   HCT 37.9 01/07/2013   PLT 190.0 01/07/2013   GLUCOSE 85 01/07/2013   CHOL 176 01/07/2013   TRIG 47.0 01/07/2013   HDL 71.70 01/07/2013   LDLCALC 95 01/07/2013   ALT 25 01/07/2013   AST 24 01/07/2013   NA 139 01/07/2013   K 4.3 01/07/2013   CL 106 01/07/2013   CREATININE 0.7 01/07/2013   BUN 15 01/07/2013   CO2 28 01/07/2013   TSH 3.22 01/07/2013      Assessment & Plan:   allergic rhinitis with eustachian tube dysfunction Cough, postnasal drip related to above   Explained lack of efficacy for antibiotics  Prednisone taper for inflammation reduction Begin daily antihistamine and nasal steroid -erx done  Symptomatic care with  Tylenol or Advil, hydration and rest -

## 2013-03-18 NOTE — Progress Notes (Signed)
Pre-visit discussion using our clinic review tool. No additional management support is needed unless otherwise documented below in the visit note.  

## 2013-03-18 NOTE — Patient Instructions (Signed)
It was good to see you today.  We have reviewed your prior records including labs and tests today  Pred taper for inflammation x 6 days, Claritin daily for next 7 days and flonase spray - Your prescription(s) have been submitted to your pharmacy. Please take as directed and contact our office if you believe you are having problem(s) with the medication(s).  If you develop worsening symptoms or fever, call and we can reconsider antibiotics, but it does not appear necessary to use antibiotics at this time.

## 2013-04-01 ENCOUNTER — Other Ambulatory Visit: Payer: Self-pay | Admitting: *Deleted

## 2013-04-01 MED ORDER — MELOXICAM 15 MG PO TABS: ORAL_TABLET | ORAL | Status: DC

## 2013-04-01 NOTE — Telephone Encounter (Signed)
Requesting refill on her meloxicam.../lmb

## 2013-07-27 ENCOUNTER — Other Ambulatory Visit: Payer: Self-pay | Admitting: *Deleted

## 2013-07-27 MED ORDER — MELOXICAM 15 MG PO TABS: ORAL_TABLET | ORAL | Status: DC

## 2013-08-09 ENCOUNTER — Other Ambulatory Visit: Payer: Self-pay | Admitting: Internal Medicine

## 2013-08-24 ENCOUNTER — Ambulatory Visit (INDEPENDENT_AMBULATORY_CARE_PROVIDER_SITE_OTHER): Payer: 59 | Admitting: Internal Medicine

## 2013-08-24 VITALS — BP 116/88 | HR 78 | Temp 98.5°F | Resp 13 | Wt 180.0 lb

## 2013-08-24 DIAGNOSIS — J209 Acute bronchitis, unspecified: Secondary | ICD-10-CM

## 2013-08-24 DIAGNOSIS — H68109 Unspecified obstruction of Eustachian tube, unspecified ear: Secondary | ICD-10-CM

## 2013-08-24 MED ORDER — PREDNISONE 20 MG PO TABS: 20.0000 mg | ORAL_TABLET | Freq: Every day | ORAL | Status: DC

## 2013-08-24 MED ORDER — HYDROCODONE-HOMATROPINE 5-1.5 MG/5ML PO SYRP: 5.0000 mL | ORAL_SOLUTION | Freq: Four times a day (QID) | ORAL | Status: DC | PRN

## 2013-08-24 MED ORDER — AZITHROMYCIN 250 MG PO TABS: ORAL_TABLET | ORAL | Status: DC

## 2013-08-24 NOTE — Patient Instructions (Signed)
Go to [facility] for eustachian tube dysfunction. Drink thin  fluids liberally through the day and chew sugarless gum . Do the Valsalva maneuver several times a day to "pop" ears open. Flonase 1 spray in each nostril twice a day as needed. Use the "crossover" technique as discussed. Use a Neti pot daily- 2x /day  as needed for sinus congestion  

## 2013-08-24 NOTE — Progress Notes (Signed)
Pre visit review using our clinic review tool, if applicable. No additional management support is needed unless otherwise documented below in the visit note. 

## 2013-08-24 NOTE — Progress Notes (Signed)
   Subjective:    Patient ID: Kathy King, female    DOB: 01/21/1956, 58 y.o.   MRN: [id number]  HPI   Symptoms began 08/17/13 while visiting family in [place]. Initial symptoms were rhinitis and chest congestion with SOB. There was more head congestion than chest congestion at that time; but she subsequently has developed a rattly, productive cough with yellow sputum in the last 5 days.  [place] members were not ill [place]  She has scant nasal secretions.  Mucinex as been a partial benefit  She has had a history of reactive airways disease(variant  cough asthma)  Review of Systems  She specifically denies frontal headache, facial pain, dental pain, or sore throat. As noted nasal secretions are scant  She does describe pressure in ears without discharge.  There's been no associated wheezing ;shortness of breath has resolved.  She denies any significant extrinsic symptoms of itchy, watery eyes, sneezing. She also denies fever, chills, or sweats.        Objective:   Physical Exam General appearance:good health ;well nourished; no acute distress or increased work of breathing is present.  No  lymphadenopathy about the head, neck, or axilla noted.   Eyes: No conjunctival inflammation or lid edema is present. Ears:  External ear exam shows no significant lesions or deformities.  Otoscopic examination reveals clear canals, tympanic membranes are intact bilaterally but dull with decreased light reflex. There is a faint scar of the right inferior tympanic membrane.  Nose:  External nasal examination shows no deformity or inflammation. Nasal mucosa are pink and moist without lesions or exudates. Slight L septal deviation.No obstruction to airflow.   Oral exam: Dental hygiene is good; lips and gums are healthy appearing.There is no oropharyngeal erythema or exudate noted.   Neck:  No  deformities,  masses, or tenderness noted.    Heart:  Normal rate and regular rhythm. S1 and S2 normal without gallop, murmur, click, rub or other extra sounds.   Lungs:Chest clear to auscultation; no wheezes, rhonchi,rales ,or rubs present.No increased work of breathing.  Loose, rattly cough.  Extremities:  No cyanosis, edema, or clubbing  noted    Skin: Warm & dry          Assessment & Plan:  #1 acute bronchitis; clinically there is no evidence of bronchospasm at this time  #2 eustachian tube dysfunction  Plan: See orders

## 2013-08-26 ENCOUNTER — Other Ambulatory Visit: Payer: Self-pay

## 2013-08-26 DIAGNOSIS — H68109 Unspecified obstruction of Eustachian tube, unspecified ear: Secondary | ICD-10-CM

## 2013-08-26 MED ORDER — PREDNISONE 20 MG PO TABS: 20.0000 mg | ORAL_TABLET | Freq: Every day | ORAL | Status: DC

## 2013-09-01 ENCOUNTER — Other Ambulatory Visit: Payer: Self-pay | Admitting: Internal Medicine

## 2013-09-01 DIAGNOSIS — H6593 Unspecified nonsuppurative otitis media, bilateral: Secondary | ICD-10-CM

## 2013-09-01 DIAGNOSIS — H6980 Other specified disorders of Eustachian tube, unspecified ear: Secondary | ICD-10-CM

## 2013-09-16 ENCOUNTER — Encounter: Payer: Self-pay | Admitting: Internal Medicine

## 2013-09-16 ENCOUNTER — Ambulatory Visit (INDEPENDENT_AMBULATORY_CARE_PROVIDER_SITE_OTHER): Payer: 59 | Admitting: Internal Medicine

## 2013-09-16 VITALS — BP 122/78 | HR 92 | Temp 98.0°F | Wt 174.0 lb

## 2013-09-16 DIAGNOSIS — H6980 Other specified disorders of Eustachian tube, unspecified ear: Secondary | ICD-10-CM

## 2013-09-16 DIAGNOSIS — N390 Urinary tract infection, site not specified: Secondary | ICD-10-CM

## 2013-09-16 MED ORDER — METHYLPREDNISOLONE ACETATE 80 MG/ML IJ SUSP
80.0000 mg | Freq: Once | INTRAMUSCULAR | Status: AC
  Administered 2013-09-16: 80 mg via INTRAMUSCULAR

## 2013-09-16 MED ORDER — CIPROFLOXACIN HCL 500 MG PO TABS: 500.0000 mg | ORAL_TABLET | Freq: Two times a day (BID) | ORAL | Status: DC

## 2013-09-16 NOTE — Patient Instructions (Signed)
It was good to see you today.  We have reviewed your prior records including labs and tests today  Medrol injection given today for anti-inflammatory to relieve eustachian tube dysfunction  Medications reviewed and updated Cipro 500 mg twice daily for 5 days to treat urinary tract infection, no other changes recommended at this time.  Your prescription(s) have been submitted to your pharmacy. Please take as directed and contact our office if you believe you are having problem(s) with the medication(s).

## 2013-09-16 NOTE — Progress Notes (Signed)
Subjective:    Patient ID: Kathy King, female    DOB: 07-05-1955, 58 y.o.   MRN: [id number]  Otalgia  Pertinent negatives include no coughing, ear discharge or rhinorrhea.  Urinary Tract Infection  Pertinent negatives include no flank pain or frequency.    Also reviewed chronic medical issues and interval medical events  Past Medical History  Diagnosis Date  . CHICKENPOX, HX OF   . OSTEOPENIA   . SCIATICA, LEFT 08/09/2009    s/p PT, NSAIDs  . Unspecified vitamin D deficiency     Review of Systems  Constitutional: Negative for fever and fatigue.  HENT: Negative for ear discharge, ear pain (but "crackle" with swallow), postnasal drip and rhinorrhea.   Respiratory: Negative for cough and shortness of breath.   Cardiovascular: Negative for chest pain and leg swelling.  Genitourinary: Positive for dysuria. Negative for frequency, flank pain, vaginal discharge and difficulty urinating.       Objective:   Physical Exam  BP 122/78  Pulse 92  Temp(Src) 98 F (36.7 C) (Oral)  Wt 174 lb (78.926 kg)  SpO2 97% Wt Readings from Last 3 Encounters:  09/16/13 174 lb (78.926 kg)  08/24/13 180 lb (81.647 kg)  03/18/13 171 lb 9.6 oz (77.837 kg)    Constitutional: She appears well-developed and well-nourished. No distress.  HENT: B TMs with mild hazy changes, trace clear effusion - no erythema or bulging  -OP with scant clear drainage, no erythema or exudate Neck: Normal range of motion. Neck supple. No JVD present. No thyromegaly present.  Cardiovascular: Normal rate, regular rhythm and normal heart sounds.  No murmur heard. No BLE edema. Pulmonary/Chest: Effort normal and breath sounds normal. No respiratory distress. She has no wheezes.  Psychiatric: She has a normal mood and affect. Her behavior is normal. Judgment and thought content normal.   Lab Results  Component Value Date   WBC 5.8 01/07/2013   HGB 12.7 01/07/2013   HCT 37.9 01/07/2013   PLT 190.0 01/07/2013   GLUCOSE  85 01/07/2013   CHOL 176 01/07/2013   TRIG 47.0 01/07/2013   HDL 71.70 01/07/2013   LDLCALC 95 01/07/2013   ALT 25 01/07/2013   AST 24 01/07/2013   NA 139 01/07/2013   K 4.3 01/07/2013   CL 106 01/07/2013   CREATININE 0.7 01/07/2013   BUN 15 01/07/2013   CO2 28 01/07/2013   TSH 3.22 01/07/2013    Mm Digital Screening  12/22/2012   *RADIOLOGY REPORT*  Clinical Data: Screening.  DIGITAL SCREENING BILATERAL MAMMOGRAM WITH CAD  Comparison:  07/22/2005  FINDINGS:  ACR Breast Density Category c:  The breast tissue is heterogeneously dense, which may obscure small masses.  There is no suspicious dominant mass, architectural distortion, or calcification to suggest malignancy.  Images were processed with CAD.  IMPRESSION: No mammographic evidence of malignancy.  A result letter of this screening mammogram will be mailed directly to the patient.  RECOMMENDATION: Screening mammogram in one year. (Code:SM-B-01Y)  BI-RADS CATEGORY 1:  Negative.   Original Report Authenticated By: Ulyess Blossom, M.D.       Assessment & Plan:   UTI. Symptomatic dysuria x72 hours with positive POC urine. Treat with empiric Cipro, patient advised symptoms unimproved for urine culture and change in therapy as needed  eustachian tube dysfunction. Ongoing symptoms greater than 6 weeks. (crackling with swallow). Unimproved with antihistamine/decongestant and nasal steroids. ENT evaluation 2 weeks ago reviewed. Will treat with Medrol IM for anti-inflammatory. Also suggested change in  decongestant therapy to pseudoephedrine 30 mg every 4 hours

## 2013-09-16 NOTE — Progress Notes (Signed)
Pre visit review using our clinic review tool, if applicable. No additional management support is needed unless otherwise documented below in the visit note. 

## 2013-11-10 ENCOUNTER — Other Ambulatory Visit: Payer: Self-pay | Admitting: *Deleted

## 2013-11-10 MED ORDER — MELOXICAM 15 MG PO TABS: ORAL_TABLET | ORAL | Status: DC

## 2013-11-10 MED ORDER — CYCLOBENZAPRINE HCL 10 MG PO TABS: ORAL_TABLET | ORAL | Status: DC

## 2013-12-12 ENCOUNTER — Other Ambulatory Visit: Payer: Self-pay

## 2013-12-12 DIAGNOSIS — Z1231 Encounter for screening mammogram for malignant neoplasm of breast: Secondary | ICD-10-CM

## 2013-12-26 ENCOUNTER — Ambulatory Visit
Admission: RE | Admit: 2013-12-26 | Discharge: 2013-12-26 | Disposition: A | Discharge status: Home / Self Care | Payer: 59 | Source: Ambulatory Visit

## 2013-12-26 ENCOUNTER — Encounter (INDEPENDENT_AMBULATORY_CARE_PROVIDER_SITE_OTHER): Payer: Self-pay

## 2013-12-26 DIAGNOSIS — Z1231 Encounter for screening mammogram for malignant neoplasm of breast: Secondary | ICD-10-CM

## 2014-02-22 ENCOUNTER — Other Ambulatory Visit: Payer: Self-pay | Admitting: *Deleted

## 2014-02-22 MED ORDER — MELOXICAM 15 MG PO TABS: ORAL_TABLET | ORAL | Status: DC

## 2014-02-22 NOTE — Telephone Encounter (Signed)
Pt is requesting refill on her flexeril. Inform pt sent to pharmacy...Johny Chess

## 2014-05-02 ENCOUNTER — Telehealth: Payer: Self-pay | Admitting: *Deleted

## 2014-05-02 ENCOUNTER — Other Ambulatory Visit (INDEPENDENT_AMBULATORY_CARE_PROVIDER_SITE_OTHER): Payer: 59

## 2014-05-02 DIAGNOSIS — R197 Diarrhea, unspecified: Secondary | ICD-10-CM

## 2014-05-02 LAB — CBC WITH DIFFERENTIAL/PLATELET
Basophils Absolute: 0 10*3/uL (ref 0.0–0.1)
Basophils Relative: 0.2 % (ref 0.0–3.0)
Eosinophils Absolute: 0.1 10*3/uL (ref 0.0–0.7)
Eosinophils Relative: 1.1 % (ref 0.0–5.0)
HCT: 42.3 % (ref 36.0–46.0)
Hemoglobin: 13.9 g/dL (ref 12.0–15.0)
Lymphocytes Relative: 28.3 % (ref 12.0–46.0)
Lymphs Abs: 2 10*3/uL (ref 0.7–4.0)
MCHC: 32.9 g/dL (ref 30.0–36.0)
MCV: 88.3 fl (ref 78.0–100.0)
Monocytes Absolute: 0.5 10*3/uL (ref 0.1–1.0)
Monocytes Relative: 7.2 % (ref 3.0–12.0)
Neutro Abs: 4.4 10*3/uL (ref 1.4–7.7)
Neutrophils Relative %: 63.2 % (ref 43.0–77.0)
Platelets: 217 10*3/uL (ref 150.0–400.0)
RBC: 4.8 Mil/uL (ref 3.87–5.11)
RDW: 13.2 % (ref 11.5–15.5)
WBC: 6.9 10*3/uL (ref 4.0–10.5)

## 2014-05-02 LAB — HEPATIC FUNCTION PANEL
ALT: 23 U/L (ref 0–35)
AST: 25 U/L (ref 0–37)
Albumin: 4.7 g/dL (ref 3.5–5.2)
Alkaline Phosphatase: 72 U/L (ref 39–117)
Bilirubin, Direct: 0.1 mg/dL (ref 0.0–0.3)
Total Bilirubin: 0.6 mg/dL (ref 0.2–1.2)
Total Protein: 7 g/dL (ref 6.0–8.3)

## 2014-05-02 LAB — BASIC METABOLIC PANEL
BUN: 15 mg/dL (ref 6–23)
CO2: 28 mEq/L (ref 19–32)
Calcium: 10.1 mg/dL (ref 8.4–10.5)
Chloride: 103 mEq/L (ref 96–112)
Creatinine, Ser: 0.8 mg/dL (ref 0.4–1.2)
GFR: 80 mL/min (ref >60.00)
Glucose, Bld: 90 mg/dL (ref 70–99)
Potassium: 4.2 mEq/L (ref 3.5–5.1)
Sodium: 140 mEq/L (ref 135–145)

## 2014-05-02 LAB — TSH: TSH: 1.9 u[IU]/mL (ref 0.35–4.50)

## 2014-05-02 NOTE — Telephone Encounter (Signed)
I have ordered labs Yes, yogurt can be culprit -should avoid dairy products until symptoms improved Further workup to depend on lab results and avoidance of yogurt for next 1-2 weeks thanks

## 2014-05-02 NOTE — Telephone Encounter (Signed)
Patient has had some BM changes in the last 5 weeks or so.  Goes daily at least 3 times and is lose and sometimes watery.. Advise on what to do.  Patient has had some weight loss since March weighing now 157.4, but has been dieting but the last 3 to 4 lbs. Have come off without much work. Does she need appt./labs ??.there are no other symptoms, no blood, only a little stomach cramping at the time of BM otherwise feels good and no other changes.  ? Pt is eating 2 containers of greek yogurt a day could this be a cause of her sxs...Johny Chess

## 2014-05-02 NOTE — Telephone Encounter (Signed)
Notified pt with md response.../lmb 

## 2014-07-27 ENCOUNTER — Other Ambulatory Visit: Payer: Self-pay | Admitting: *Deleted

## 2014-07-27 MED ORDER — MELOXICAM 15 MG PO TABS: ORAL_TABLET | ORAL | Status: DC

## 2014-07-27 NOTE — Telephone Encounter (Signed)
Left msg on triage stating change pharmacy to [facility] needing refill on her meloxicam. Called pt back inform her sent to new pharmacy...Johny Chess

## 2014-07-28 LAB — HM PAP SMEAR

## 2014-08-16 ENCOUNTER — Other Ambulatory Visit: Payer: Self-pay | Admitting: Obstetrics and Gynecology

## 2014-08-17 LAB — CYTOLOGY - PAP

## 2014-08-25 ENCOUNTER — Encounter: Payer: Self-pay | Admitting: Gastroenterology

## 2014-08-29 ENCOUNTER — Encounter: Payer: Self-pay | Admitting: Internal Medicine

## 2014-10-25 ENCOUNTER — Encounter: Payer: Self-pay | Admitting: Internal Medicine

## 2014-12-06 ENCOUNTER — Other Ambulatory Visit: Payer: Self-pay

## 2014-12-06 DIAGNOSIS — Z1231 Encounter for screening mammogram for malignant neoplasm of breast: Secondary | ICD-10-CM

## 2014-12-13 ENCOUNTER — Ambulatory Visit: Payer: 59

## 2014-12-13 ENCOUNTER — Encounter: Payer: Self-pay | Admitting: Internal Medicine

## 2014-12-13 ENCOUNTER — Other Ambulatory Visit (INDEPENDENT_AMBULATORY_CARE_PROVIDER_SITE_OTHER): Payer: 59

## 2014-12-13 ENCOUNTER — Ambulatory Visit (INDEPENDENT_AMBULATORY_CARE_PROVIDER_SITE_OTHER): Payer: 59 | Admitting: Internal Medicine

## 2014-12-13 VITALS — Ht 64.0 in | Wt 154.6 lb

## 2014-12-13 VITALS — BP 120/78 | HR 72 | Temp 98.0°F | Resp 18 | Ht 64.0 in | Wt 155.0 lb

## 2014-12-13 DIAGNOSIS — Z1159 Encounter for screening for other viral diseases: Secondary | ICD-10-CM

## 2014-12-13 DIAGNOSIS — Z860100 Personal history of colon polyps, unspecified: Secondary | ICD-10-CM

## 2014-12-13 DIAGNOSIS — Z23 Encounter for immunization: Secondary | ICD-10-CM

## 2014-12-13 DIAGNOSIS — R5383 Other fatigue: Secondary | ICD-10-CM

## 2014-12-13 DIAGNOSIS — Z114 Encounter for screening for human immunodeficiency virus [HIV]: Secondary | ICD-10-CM

## 2014-12-13 DIAGNOSIS — Z0000 Encounter for general adult medical examination without abnormal findings: Secondary | ICD-10-CM

## 2014-12-13 DIAGNOSIS — E559 Vitamin D deficiency, unspecified: Secondary | ICD-10-CM

## 2014-12-13 LAB — CBC WITH DIFFERENTIAL/PLATELET
Basophils Absolute: 0 10*3/uL (ref 0.0–0.1)
Basophils Relative: 0.4 % (ref 0.0–3.0)
Eosinophils Absolute: 0.1 10*3/uL (ref 0.0–0.7)
Eosinophils Relative: 0.8 % (ref 0.0–5.0)
HCT: 42.4 % (ref 36.0–46.0)
Hemoglobin: 14.2 g/dL (ref 12.0–15.0)
Lymphocytes Relative: 16.9 % (ref 12.0–46.0)
Lymphs Abs: 1.5 10*3/uL (ref 0.7–4.0)
MCHC: 33.6 g/dL (ref 30.0–36.0)
MCV: 88.9 fl (ref 78.0–100.0)
Monocytes Absolute: 0.6 10*3/uL (ref 0.1–1.0)
Monocytes Relative: 6.5 % (ref 3.0–12.0)
Neutro Abs: 6.8 10*3/uL (ref 1.4–7.7)
Neutrophils Relative %: 75.4 % (ref 43.0–77.0)
Platelets: 212 10*3/uL (ref 150.0–400.0)
RBC: 4.77 Mil/uL (ref 3.87–5.11)
RDW: 13.3 % (ref 11.5–15.5)
WBC: 9.1 10*3/uL (ref 4.0–10.5)

## 2014-12-13 LAB — URINALYSIS, ROUTINE W REFLEX MICROSCOPIC
Bilirubin Urine: NEGATIVE
Hgb urine dipstick: NEGATIVE
Leukocytes, UA: NEGATIVE
Nitrite: NEGATIVE
RBC / HPF: NONE SEEN (ref 0–?)
Specific Gravity, Urine: 1.02 (ref 1.000–1.030)
Total Protein, Urine: NEGATIVE
Urine Glucose: NEGATIVE
Urobilinogen, UA: 0.2 (ref 0.0–1.0)
pH: 5.5 (ref 5.0–8.0)

## 2014-12-13 LAB — LIPID PANEL
Cholesterol: 208 mg/dL — ABNORMAL HIGH (ref 0–200)
HDL: 80.2 mg/dL (ref >39.00)
LDL Cholesterol: 115 mg/dL — ABNORMAL HIGH (ref 0–99)
NonHDL: 127.83
Total CHOL/HDL Ratio: 3
Triglycerides: 65 mg/dL (ref 0.0–149.0)
VLDL: 13 mg/dL (ref 0.0–40.0)

## 2014-12-13 LAB — HEPATIC FUNCTION PANEL
ALT: 30 U/L (ref 0–35)
AST: 26 U/L (ref 0–37)
Albumin: 4.7 g/dL (ref 3.5–5.2)
Alkaline Phosphatase: 99 U/L (ref 39–117)
Bilirubin, Direct: 0.1 mg/dL (ref 0.0–0.3)
Total Bilirubin: 0.5 mg/dL (ref 0.2–1.2)
Total Protein: 7 g/dL (ref 6.0–8.3)

## 2014-12-13 LAB — BASIC METABOLIC PANEL
BUN: 19 mg/dL (ref 6–23)
CO2: 28 mEq/L (ref 19–32)
Calcium: 9.8 mg/dL (ref 8.4–10.5)
Chloride: 101 mEq/L (ref 96–112)
Creatinine, Ser: 0.82 mg/dL (ref 0.40–1.20)
GFR: 76.46 mL/min (ref >60.00)
Glucose, Bld: 91 mg/dL (ref 70–99)
Potassium: 3.9 mEq/L (ref 3.5–5.1)
Sodium: 138 mEq/L (ref 135–145)

## 2014-12-13 LAB — TSH: TSH: 2.2 u[IU]/mL (ref 0.35–4.50)

## 2014-12-13 LAB — VITAMIN D 25 HYDROXY (VIT D DEFICIENCY, FRACTURES): VITD: 56.78 ng/mL (ref 30.00–100.00)

## 2014-12-13 LAB — VITAMIN B12: Vitamin B-12: 492 pg/mL (ref 211–911)

## 2014-12-13 MED ORDER — MELOXICAM 15 MG PO TABS: 15.0000 mg | ORAL_TABLET | Freq: Every day | ORAL | Status: DC

## 2014-12-13 NOTE — Progress Notes (Signed)
No allergies to eggs or soy No past problems with anesthesia No diet/weight loss meds No home oxygen  Has email  Emmi instructions given for colonoscopy 

## 2014-12-13 NOTE — Progress Notes (Signed)
Subjective:    Patient ID: Kathy King, female    DOB: 09/18/1955, 59 y.o.   MRN: [id number]  HPI  patient is here today for annual physical. Patient feels well and has no complaints.  Past Medical History  Diagnosis Date  . CHICKENPOX, HX OF   . OSTEOPENIA   . SCIATICA, LEFT 08/09/2009    s/p PT, NSAIDs  . Unspecified vitamin D deficiency   . Adenomatous colon polyp     tubular   Family History  Problem Relation Age of Onset  . Arthritis Other     grandparent  . Breast cancer Maternal Grandmother   . Memory loss Mother   . Hypertension Mother   . Hyperlipidemia Mother   . Seizures Son    Social History  Substance Use Topics  . Smoking status: Never Smoker   . Smokeless tobacco: None  . Alcohol Use: No    Review of Systems  Constitutional: Positive for fatigue. Negative for unexpected weight change (intentional, conscious loss of wt).  Respiratory: Negative for cough, shortness of breath and wheezing.   Cardiovascular: Negative for chest pain, palpitations and leg swelling.  Gastrointestinal: Negative for nausea, abdominal pain and diarrhea.  Musculoskeletal: Positive for arthralgias (controlled with meloxicam).  Neurological: Negative for dizziness, weakness, light-headedness and headaches.  Psychiatric/Behavioral: Negative for dysphoric mood. The patient is not nervous/anxious.   All other systems reviewed and are negative.      Objective:    Physical Exam  Constitutional: She is oriented to person, place, and time. She appears well-developed and well-nourished. No distress.  HENT:  Head: Normocephalic and atraumatic.  Right Ear: External ear normal.  Left Ear: External ear normal.  Nose: Nose normal.  Mouth/Throat: Oropharynx is clear and moist. No oropharyngeal exudate.  Eyes: EOM are normal. Pupils are equal, round, and reactive to light. Right eye exhibits no discharge. Left eye exhibits no discharge. No scleral icterus.  Neck: Normal range of  motion. Neck supple. No JVD present. No tracheal deviation present. No thyromegaly present.  Cardiovascular: Normal rate, regular rhythm, normal heart sounds and intact distal pulses.  Exam reveals no friction rub.   No murmur heard. Pulmonary/Chest: Effort normal and breath sounds normal. No respiratory distress. She has no wheezes. She has no rales. She exhibits no tenderness.  Abdominal: Soft. Bowel sounds are normal. She exhibits no distension and no mass. There is no tenderness. There is no rebound and no guarding.  Genitourinary:  Defer to gyn  Musculoskeletal: Normal range of motion.  No gross deformities  Lymphadenopathy:    She has no cervical adenopathy.  Neurological: She is alert and oriented to person, place, and time. She has normal reflexes. No cranial nerve deficit.  Skin: Skin is warm and dry. No rash noted. She is not diaphoretic. No erythema.  Psychiatric: She has a normal mood and affect. Her behavior is normal. Judgment and thought content normal.  Nursing note and vitals reviewed.   BP 120/78 mmHg  Pulse 72  Temp(Src) 98 F (36.7 C) (Oral)  Resp 18  Ht 5\' 4"  (1.626 m)  Wt 155 lb (70.308 kg)  BMI 26.59 kg/m2  SpO2 98% Wt Readings from Last 3 Encounters:  12/13/14 155 lb (70.308 kg)  09/16/13 174 lb (78.926 kg)  08/24/13 180 lb (81.647 kg)    Lab Results  Component Value Date   WBC 6.9 05/02/2014   HGB 13.9 05/02/2014   HCT 42.3 05/02/2014   PLT 217.0 05/02/2014   GLUCOSE  90 05/02/2014   CHOL 176 01/07/2013   TRIG 47.0 01/07/2013   HDL 71.70 01/07/2013   LDLCALC 95 01/07/2013   ALT 23 05/02/2014   AST 25 05/02/2014   NA 140 05/02/2014   K 4.2 05/02/2014   CL 103 05/02/2014   CREATININE 0.8 05/02/2014   BUN 15 05/02/2014   CO2 28 05/02/2014   TSH 1.90 05/02/2014    Mm Digital Screening Bilateral  12/27/2013   CLINICAL DATA:  Screening.  EXAM: DIGITAL SCREENING BILATERAL MAMMOGRAM WITH CAD  COMPARISON:  Previous exam(s).  ACR Breast Density  Category c: The breast tissue is heterogeneously dense, which may obscure small masses.  FINDINGS: There are no findings suspicious for malignancy. Images were processed with CAD.  IMPRESSION: No mammographic evidence of malignancy. A result letter of this screening mammogram will be mailed directly to the patient.  RECOMMENDATION: Screening mammogram in one year. (Code:SM-B-01Y)  BI-RADS CATEGORY  1: Negative.   Electronically Signed   By: Lillia Mountain M.D.   On: 12/27/2013 13:31       Assessment & Plan:   CPX/z00.00- Patient has been counseled on age-appropriate routine health concerns for screening and prevention. These are reviewed and up-to-date. Immunizations are up-to-date or declined. Labs ordered and reviewed. Tdap updated  Fatigue - nonspecific symptoms/exam - check screening labs  Problem List Items Addressed This Visit    None    Visit Diagnoses    Routine general medical examination at a health care facility    -  Primary    Other fatigue            Gwendolyn Grant, MD

## 2014-12-13 NOTE — Patient Instructions (Addendum)
It was good to see you today.  We have reviewed your prior records including labs and tests today  Health Maintenance reviewed - Tdap today - all other recommended immunizations and age-appropriate screenings are up-to-date or scheduled.  Test(s) ordered today. Your results will be released to [place] (or called to you) after review, usually within 72hours after test completion. If any changes need to be made, you will be notified at that same time.  Medications reviewed and updated, no changes recommended at this time. Refill on medication(s) as discussed today.  Please schedule followup in 12 months for annual exam and labs, call sooner if problems.  Health Maintenance Adopting a healthy lifestyle and getting preventive care can go a long way to promote health and wellness. Talk with your health care provider about what schedule of regular examinations is right for you. This is a good chance for you to check in with your provider about disease prevention and staying healthy. In between checkups, there are plenty of things you can do on your own. Experts have done a lot of research about which lifestyle changes and preventive measures are most likely to keep you healthy. Ask your health care provider for more information. WEIGHT AND DIET  Eat a healthy diet  Be sure to include plenty of vegetables, fruits, low-fat dairy products, and lean protein.  Do not eat a lot of foods high in solid fats, added sugars, or salt.  Get regular exercise. This is one of the most important things you can do for your health.  Most adults should exercise for at least 150 minutes each week. The exercise should increase your heart rate and make you sweat (moderate-intensity exercise).  Most adults should also do strengthening exercises at least twice a week. This is in addition to the moderate-intensity exercise.  Maintain a healthy weight  Body mass index (BMI) is a measurement that can be used to identify  possible weight problems. It estimates body fat based on height and weight. Your health care provider can help determine your BMI and help you achieve or maintain a healthy weight.  For females 55 years of age and older:   A BMI below 18.5 is considered underweight.  A BMI of 18.5 to 24.9 is normal.  A BMI of 25 to 29.9 is considered overweight.  A BMI of 30 and above is considered obese.  Watch levels of cholesterol and blood lipids  You should start having your blood tested for lipids and cholesterol at 57 years of age, then have this test every 5 years.  You may need to have your cholesterol levels checked more often if:  Your lipid or cholesterol levels are high.  You are older than 57 years of age.  You are at high risk for heart disease.  CANCER SCREENING   Lung Cancer  Lung cancer screening is recommended for adults 99-64 years old who are at high risk for lung cancer because of a history of smoking.  A yearly low-dose CT scan of the lungs is recommended for people who:  Currently smoke.  Have quit within the past 15 years.  Have at least a 30-pack-year history of smoking. A pack year is smoking an average of one pack of cigarettes a day for 1 year.  Yearly screening should continue until it has been 15 years since you quit.  Yearly screening should stop if you develop a health problem that would prevent you from having lung cancer treatment.  Breast Cancer  Practice breast self-awareness. This means understanding how your breasts normally appear and feel.  It also means doing regular breast self-exams. Let your health care provider know about any changes, no matter how small.  If you are in your 20s or 30s, you should have a clinical breast exam (CBE) by a health care provider every 1-3 years as part of a regular health exam.  If you are 51 or older, have a CBE every year. Also consider having a breast X-ray (mammogram) every year.  If you have a family  history of breast cancer, talk to your health care provider about genetic screening.  If you are at high risk for breast cancer, talk to your health care provider about having an MRI and a mammogram every year.  Breast cancer gene (BRCA) assessment is recommended for women who have family members with BRCA-related cancers. BRCA-related cancers include:  Breast.  Ovarian.  Tubal.  Peritoneal cancers.  Results of the assessment will determine the need for genetic counseling and BRCA1 and BRCA2 testing. Cervical Cancer Routine pelvic examinations to screen for cervical cancer are no longer recommended for nonpregnant women who are considered low risk for cancer of the pelvic organs (ovaries, uterus, and vagina) and who do not have symptoms. A pelvic examination may be necessary if you have symptoms including those associated with pelvic infections. Ask your health care provider if a screening pelvic exam is right for you.   The Pap test is the screening test for cervical cancer for women who are considered at risk.  If you had a hysterectomy for a problem that was not cancer or a condition that could lead to cancer, then you no longer need Pap tests.  If you are older than 65 years, and you have had normal Pap tests for the past 10 years, you no longer need to have Pap tests.  If you have had past treatment for cervical cancer or a condition that could lead to cancer, you need Pap tests and screening for cancer for at least 20 years after your treatment.  If you no longer get a Pap test, assess your risk factors if they change (such as having a new sexual partner). This can affect whether you should start being screened again.  Some women have medical problems that increase their chance of getting cervical cancer. If this is the case for you, your health care provider may recommend more frequent screening and Pap tests.  The human papillomavirus (HPV) test is another test that may be used  for cervical cancer screening. The HPV test looks for the virus that can cause cell changes in the cervix. The cells collected during the Pap test can be tested for HPV.  The HPV test can be used to screen women 62 years of age and older. Getting tested for HPV can extend the interval between normal Pap tests from three to five years.  An HPV test also should be used to screen women of any age who have unclear Pap test results.  After 57 years of age, women should have HPV testing as often as Pap tests.  Colorectal Cancer  This type of cancer can be detected and often prevented.  Routine colorectal cancer screening usually begins at 57 years of age and continues through 57 years of age.  Your health care provider may recommend screening at an earlier age if you have risk factors for colon cancer.  Your health care provider may also recommend using home test kits  to check for hidden blood in the stool.  A small camera at the end of a tube can be used to examine your colon directly (sigmoidoscopy or colonoscopy). This is done to check for the earliest forms of colorectal cancer.  Routine screening usually begins at age 6.  Direct examination of the colon should be repeated every 5-10 years through 57 years of age. However, you may need to be screened more often if early forms of precancerous polyps or small growths are found. Skin Cancer  Check your skin from head to toe regularly.  Tell your health care provider about any new moles or changes in moles, especially if there is a change in a mole's shape or color.  Also tell your health care provider if you have a mole that is larger than the size of a pencil eraser.  Always use sunscreen. Apply sunscreen liberally and repeatedly throughout the day.  Protect yourself by wearing long sleeves, pants, a wide-brimmed hat, and sunglasses whenever you are outside. HEART DISEASE, DIABETES, AND HIGH BLOOD PRESSURE   Have your blood pressure  checked at least every 1-2 years. High blood pressure causes heart disease and increases the risk of stroke.  If you are between 72 years and 61 years old, ask your health care provider if you should take aspirin to prevent strokes.  Have regular diabetes screenings. This involves taking a blood sample to check your fasting blood sugar level.  If you are at a normal weight and have a low risk for diabetes, have this test once every three years after 57 years of age.  If you are overweight and have a high risk for diabetes, consider being tested at a younger age or more often. PREVENTING INFECTION  Hepatitis B  If you have a higher risk for hepatitis B, you should be screened for this virus. You are considered at high risk for hepatitis B if:  You were born in a country where hepatitis B is common. Ask your health care provider which countries are considered high risk.  Your parents were born in a high-risk country, and you have not been immunized against hepatitis B (hepatitis B vaccine).  You have HIV or AIDS.  You use needles to inject street drugs.  You live with someone who has hepatitis B.  You have had sex with someone who has hepatitis B.  You get hemodialysis treatment.  You take certain medicines for conditions, including cancer, organ transplantation, and autoimmune conditions. Hepatitis C  Blood testing is recommended for:  Everyone born from [birth year] through 1963.  Anyone with known risk factors for hepatitis C. Sexually transmitted infections (STIs)  You should be screened for sexually transmitted infections (STIs) including gonorrhea and chlamydia if:  You are sexually active and are younger than 57 years of age.  You are older than 57 years of age and your health care provider tells you that you are at risk for this type of infection.  Your sexual activity has changed since you were last screened and you are at an increased risk for chlamydia or gonorrhea. Ask  your health care provider if you are at risk.  If you do not have HIV, but are at risk, it may be recommended that you take a prescription medicine daily to prevent HIV infection. This is called pre-exposure prophylaxis (PrEP). You are considered at risk if:  You are sexually active and do not regularly use condoms or know the HIV status of your partner(s).  You  take drugs by injection.  You are sexually active with a partner who has HIV. Talk with your health care provider about whether you are at high risk of being infected with HIV. If you choose to begin PrEP, you should first be tested for HIV. You should then be tested every 3 months for as long as you are taking PrEP.  PREGNANCY   If you are premenopausal and you may become pregnant, ask your health care provider about preconception counseling.  If you may become pregnant, take 400 to 800 micrograms (mcg) of folic acid every day.  If you want to prevent pregnancy, talk to your health care provider about birth control (contraception). OSTEOPOROSIS AND MENOPAUSE   Osteoporosis is a disease in which the bones lose minerals and strength with aging. This can result in serious bone fractures. Your risk for osteoporosis can be identified using a bone density scan.  If you are 79 years of age or older, or if you are at risk for osteoporosis and fractures, ask your health care provider if you should be screened.  Ask your health care provider whether you should take a calcium or vitamin D supplement to lower your risk for osteoporosis.  Menopause may have certain physical symptoms and risks.  Hormone replacement therapy may reduce some of these symptoms and risks. Talk to your health care provider about whether hormone replacement therapy is right for you.  HOME CARE INSTRUCTIONS   Schedule regular health, dental, and eye exams.  Stay current with your immunizations.   Do not use any tobacco products including cigarettes, chewing  tobacco, or electronic cigarettes.  If you are pregnant, do not drink alcohol.  If you are breastfeeding, limit how much and how often you drink alcohol.  Limit alcohol intake to no more than 1 drink per day for nonpregnant women. One drink equals 12 ounces of beer, 5 ounces of wine, or 1 ounces of hard liquor.  Do not use street drugs.  Do not share needles.  Ask your health care provider for help if you need support or information about quitting drugs.  Tell your health care provider if you often feel depressed.  Tell your health care provider if you have ever been abused or do not feel safe at home. Document Released: 10/28/2010 Document Revised: 08/29/2013 Document Reviewed: 03/16/2013 [facility] Patient Information 2015 [place], [place]. This information is not intended to replace advice given to you by your health care provider. Make sure you discuss any questions you have with your health care provider.

## 2014-12-13 NOTE — Assessment & Plan Note (Signed)
On OTC replacement, hx osteopenia with gyn Check level now, increase replacement if needed

## 2014-12-14 LAB — HIV ANTIBODY (ROUTINE TESTING W REFLEX): HIV 1&2 Ab, 4th Generation: NONREACTIVE

## 2014-12-14 LAB — HEPATITIS C ANTIBODY: HCV Ab: NEGATIVE

## 2015-01-03 ENCOUNTER — Encounter: Payer: 59 | Admitting: Internal Medicine

## 2015-01-03 ENCOUNTER — Ambulatory Visit: Payer: 59

## 2015-01-05 ENCOUNTER — Encounter: Payer: Self-pay | Admitting: Internal Medicine

## 2015-01-17 ENCOUNTER — Ambulatory Visit
Admission: RE | Admit: 2015-01-17 | Discharge: 2015-01-17 | Disposition: A | Discharge status: Home / Self Care | Payer: 59 | Source: Ambulatory Visit

## 2015-01-17 DIAGNOSIS — Z1231 Encounter for screening mammogram for malignant neoplasm of breast: Secondary | ICD-10-CM

## 2015-01-19 ENCOUNTER — Other Ambulatory Visit: Payer: Self-pay | Admitting: General Practice

## 2015-01-19 ENCOUNTER — Telehealth: Payer: Self-pay | Admitting: Family Medicine

## 2015-01-19 MED ORDER — SUMATRIPTAN SUCCINATE 50 MG PO TABS: 50.0000 mg | ORAL_TABLET | ORAL | Status: DC | PRN

## 2015-01-19 NOTE — Telephone Encounter (Signed)
Pt is at work and developed migraine.  Has taken Triptans in the past but does not have active prescription.  Medication sent to pharmacy.

## 2015-01-30 ENCOUNTER — Encounter: Payer: Self-pay | Admitting: Physician Assistant

## 2015-01-30 ENCOUNTER — Ambulatory Visit (INDEPENDENT_AMBULATORY_CARE_PROVIDER_SITE_OTHER): Payer: 59 | Admitting: Physician Assistant

## 2015-01-30 VITALS — BP 116/70 | HR 76 | Temp 98.1°F | Resp 16 | Ht 64.0 in | Wt 155.0 lb

## 2015-01-30 DIAGNOSIS — J069 Acute upper respiratory infection, unspecified: Secondary | ICD-10-CM

## 2015-01-30 DIAGNOSIS — B9789 Other viral agents as the cause of diseases classified elsewhere: Principal | ICD-10-CM

## 2015-01-30 MED ORDER — HYDROCOD POLST-CPM POLST ER 10-8 MG/5ML PO SUER: 5.0000 mL | Freq: Every evening | ORAL | Status: DC | PRN

## 2015-01-30 MED ORDER — BENZONATATE 100 MG PO CAPS: 100.0000 mg | ORAL_CAPSULE | Freq: Two times a day (BID) | ORAL | Status: DC | PRN

## 2015-01-30 NOTE — Assessment & Plan Note (Signed)
Exam unremarkable. Typical viral presentation. Increase fluids. Saline nasal spray. Rx Tessalon for daytime cough. Tussionex at bedtime. Continue Mucinex. Place humidifier in bedroom.

## 2015-01-30 NOTE — Progress Notes (Signed)
Pre visit review using our clinic review tool, if applicable. No additional management support is needed unless otherwise documented below in the visit note/SLS  

## 2015-01-30 NOTE — Progress Notes (Signed)
Patient presents to clinic today c/o non productive cough, cough, chest congestion, PND and sore throat x 2 days. Denies fever, chest pain or SOB. Denies recent travel. Has multiple family member with the same symptoms. Biggest nuisance is the cough as it is affecting rest.  Past Medical History  Diagnosis Date  . CHICKENPOX, HX OF   . OSTEOPENIA   . SCIATICA, LEFT 08/09/2009    s/p PT, NSAIDs  . Unspecified vitamin D deficiency   . Adenomatous colon polyp     tubular    Current Outpatient Prescriptions on File Prior to Visit  Medication Sig Dispense Refill  . calcium gluconate 500 MG tablet Take 500 mg by mouth daily.      . Cholecalciferol (VITAMIN D) 2000 UNITS CAPS Take by mouth.      . meloxicam (MOBIC) 15 MG tablet Take 1 tablet (15 mg total) by mouth daily. 90 tablet 1  . SUMAtriptan (IMITREX) 50 MG tablet Take 1 tablet (50 mg total) by mouth every 2 (two) hours as needed for migraine. May repeat in 2 hours if headache persists or recurs. 10 tablet 0   No current facility-administered medications on file prior to visit.    Allergies  Allergen Reactions  . Sulfonamide Derivatives     REACTION: unspecified; as child    Family History  Problem Relation Age of Onset  . Arthritis Other     grandparent  . Breast cancer Maternal Grandmother   . Memory loss Mother   . Hypertension Mother   . Hyperlipidemia Mother   . Seizures Son     Social History   Social History  . Marital Status: Married    Spouse Name: N/A  . Number of Children: N/A  . Years of Education: N/A   Social History Main Topics  . Smoking status: Never Smoker   . Smokeless tobacco: Never Used  . Alcohol Use: No  . Drug Use: No  . Sexual Activity: Not Asked   Other Topics Concern  . None   Social History Narrative   Married, lives with spouse and kids    Review of Systems - See HPI.  All other ROS are negative.  BP 116/70 mmHg  Pulse 76  Temp(Src) 98.1 F (36.7 C) (Oral)  Resp 16   Ht 5\' 4"  (1.626 m)  Wt 155 lb (70.308 kg)  BMI 26.59 kg/m2  SpO2 99%  Physical Exam  Constitutional: She is oriented to person, place, and time and well-developed, well-nourished, and in no distress.  HENT:  Head: Normocephalic and atraumatic.  Right Ear: External ear normal.  Left Ear: External ear normal.  Nose: Nose normal.  Mouth/Throat: Oropharynx is clear and moist. No oropharyngeal exudate.  TM within normal limits bilaterally.  Eyes: Conjunctivae are normal.  Neck: Neck supple.  Cardiovascular: Normal rate, regular rhythm, normal heart sounds and intact distal pulses.   Pulmonary/Chest: Effort normal and breath sounds normal. No respiratory distress. She has no wheezes. She has no rales. She exhibits no tenderness.  Lymphadenopathy:    She has no cervical adenopathy.  Neurological: She is alert and oriented to person, place, and time.  Skin: Skin is warm and dry. No rash noted.  Psychiatric: Affect normal.  Vitals reviewed.   Recent Results (from the past 2160 hour(s))  Basic metabolic panel     Status: None   Collection Time: 12/13/14 10:26 AM  Result Value Ref Range   Sodium 138 135 - 145 mEq/L   Potassium  3.9 3.5 - 5.1 mEq/L   Chloride 101 96 - 112 mEq/L   CO2 28 19 - 32 mEq/L   Glucose, Bld 91 70 - 99 mg/dL   BUN 19 6 - 23 mg/dL   Creatinine, Ser 0.82 0.40 - 1.20 mg/dL   Calcium 9.8 8.4 - 10.5 mg/dL   GFR 76.46 >60.00 mL/min  CBC with Differential/Platelet     Status: None   Collection Time: 12/13/14 10:26 AM  Result Value Ref Range   WBC 9.1 4.0 - 10.5 K/uL   RBC 4.77 3.87 - 5.11 Mil/uL   Hemoglobin 14.2 12.0 - 15.0 g/dL   HCT 42.4 36.0 - 46.0 %   MCV 88.9 78.0 - 100.0 fl   MCHC 33.6 30.0 - 36.0 g/dL   RDW 13.3 11.5 - 15.5 %   Platelets 212.0 150.0 - 400.0 K/uL   Neutrophils Relative % 75.4 43.0 - 77.0 %   Lymphocytes Relative 16.9 12.0 - 46.0 %   Monocytes Relative 6.5 3.0 - 12.0 %   Eosinophils Relative 0.8 0.0 - 5.0 %   Basophils Relative 0.4 0.0  - 3.0 %   Neutro Abs 6.8 1.4 - 7.7 K/uL   Lymphs Abs 1.5 0.7 - 4.0 K/uL   Monocytes Absolute 0.6 0.1 - 1.0 K/uL   Eosinophils Absolute 0.1 0.0 - 0.7 K/uL   Basophils Absolute 0.0 0.0 - 0.1 K/uL  Hepatic function panel     Status: None   Collection Time: 12/13/14 10:26 AM  Result Value Ref Range   Total Bilirubin 0.5 0.2 - 1.2 mg/dL   Bilirubin, Direct 0.1 0.0 - 0.3 mg/dL   Alkaline Phosphatase 99 39 - 117 U/L   AST 26 0 - 37 U/L   ALT 30 0 - 35 U/L   Total Protein 7.0 6.0 - 8.3 g/dL   Albumin 4.7 3.5 - 5.2 g/dL  Lipid panel     Status: Abnormal   Collection Time: 12/13/14 10:26 AM  Result Value Ref Range   Cholesterol 208 (H) 0 - 200 mg/dL    Comment: ATP III Classification       Desirable:  < 200 mg/dL               Borderline High:  200 - 239 mg/dL          High:  > = 240 mg/dL   Triglycerides 65.0 0.0 - 149.0 mg/dL    Comment: Normal:  <150 mg/dLBorderline High:  150 - 199 mg/dL   HDL 80.20 >39.00 mg/dL   VLDL 13.0 0.0 - 40.0 mg/dL   LDL Cholesterol 115 (H) 0 - 99 mg/dL   Total CHOL/HDL Ratio 3     Comment:                Men          Women1/2 Average Risk     3.4          3.3Average Risk          5.0          4.42X Average Risk          9.6          7.13X Average Risk          15.0          11.0                       NonHDL 127.83     Comment: NOTE:  Non-HDL goal should be 30 mg/dL higher than patient's LDL goal (i.e. LDL goal of < 70 mg/dL, would have non-HDL goal of < 100 mg/dL)  TSH     Status: None   Collection Time: 12/13/14 10:26 AM  Result Value Ref Range   TSH 2.20 0.35 - 4.50 uIU/mL  Urinalysis, Routine w reflex microscopic (not at [facility])     Status: Abnormal   Collection Time: 12/13/14 10:26 AM  Result Value Ref Range   Color, Urine YELLOW Yellow;Lt. Yellow   APPearance CLEAR Clear   Specific Gravity, Urine 1.020 1.000-1.030   pH 5.5 5.0 - 8.0   Total Protein, Urine NEGATIVE Negative   Urine Glucose NEGATIVE Negative   Ketones, ur TRACE (A) Negative    Bilirubin Urine NEGATIVE Negative   Hgb urine dipstick NEGATIVE Negative   Urobilinogen, UA 0.2 0.0 - 1.0   Leukocytes, UA NEGATIVE Negative   Nitrite NEGATIVE Negative   WBC, UA 0-2/hpf 0-2/hpf   RBC / HPF none seen 0-2/hpf   Squamous Epithelial / LPF Rare(0-4/hpf) Rare(0-4/hpf)  Vit D  25 hydroxy (rtn osteoporosis monitoring)     Status: None   Collection Time: 12/13/14 10:26 AM  Result Value Ref Range   VITD 56.78 30.00 - 100.00 ng/mL  Vitamin B12     Status: None   Collection Time: 12/13/14 10:26 AM  Result Value Ref Range   Vitamin B-12 492 211 - 911 pg/mL  Hepatitis C antibody     Status: None   Collection Time: 12/13/14 10:26 AM  Result Value Ref Range   HCV Ab NEGATIVE NEGATIVE  HIV antibody     Status: None   Collection Time: 12/13/14 10:26 AM  Result Value Ref Range   HIV 1&2 Ab, 4th Generation NONREACTIVE NONREACTIVE    Comment:   HIV-1 antigen and HIV-1/HIV-2 antibodies were not detected.  There is no laboratory evidence of HIV infection.   HIV-1/2 Antibody Diff        Not indicated. HIV-1 RNA, Qual TMA          Not indicated.     PLEASE NOTE: This information has been disclosed to you from records whose confidentiality may be protected by state law. If your state requires such protection, then the state law prohibits you from making any further disclosure of the information without the specific written consent of the person to whom it pertains, or as otherwise permitted by law. A general authorization for the release of medical or other information is NOT sufficient for this purpose.   The performance of this assay has not been clinically validated in patients less than 70 years old.   For additional information please refer to http://education.[employer].com/faq/FAQ106.  (This link is being provided for informational/educational purposes only.)       Assessment/Plan: Viral URI with cough Exam unremarkable. Typical viral presentation. Increase  fluids. Saline nasal spray. Rx Tessalon for daytime cough. Tussionex at bedtime. Continue Mucinex. Place humidifier in bedroom.

## 2015-01-30 NOTE — Patient Instructions (Signed)
Please stay well hydrated and get plenty of rest. Take Mucinex twice daily. Get some saline nasal spray to keep nasal passages clear. Use Tessalon for daytime cough and Tussionex at bedtime for nighttime cough. Let me know if symptoms are not resolving.

## 2015-02-02 ENCOUNTER — Other Ambulatory Visit: Payer: Self-pay | Admitting: Physician Assistant

## 2015-02-02 MED ORDER — AZITHROMYCIN 250 MG PO TABS: ORAL_TABLET | ORAL | Status: DC

## 2015-02-09 ENCOUNTER — Ambulatory Visit (AMBULATORY_SURGERY_CENTER): Payer: 59 | Admitting: Gastroenterology

## 2015-02-09 ENCOUNTER — Encounter: Payer: Self-pay | Admitting: Gastroenterology

## 2015-02-09 VITALS — BP 123/77 | HR 70 | Temp 98.0°F | Resp 19 | Ht 64.0 in | Wt 154.0 lb

## 2015-02-09 DIAGNOSIS — Z86010 Personal history of colonic polyps: Secondary | ICD-10-CM

## 2015-02-09 MED ORDER — SODIUM CHLORIDE 0.9 % IV SOLN: 500.0000 mL | INTRAVENOUS | Status: DC

## 2015-02-09 NOTE — Progress Notes (Signed)
Transferred to recovery room. A/O x3, pleased with MAC.  VSS.  Report to Celia, RN. 

## 2015-02-09 NOTE — Patient Instructions (Signed)
Discharge instructions given. Normal exam. Resume previous medications. YOU HAD AN ENDOSCOPIC PROCEDURE TODAY AT THE [facility]:   Refer to the procedure report that was given to you for any specific questions about what was found during the examination.  If the procedure report does not answer your questions, please call your gastroenterologist to clarify.  If you requested that your care partner not be given the details of your procedure findings, then the procedure report has been included in a sealed envelope for you to review at your convenience later.  YOU SHOULD EXPECT: Some feelings of bloating in the abdomen. Passage of more gas than usual.  Walking can help get rid of the air that was put into your GI tract during the procedure and reduce the bloating. If you had a lower endoscopy (such as a colonoscopy or flexible sigmoidoscopy) you may notice spotting of blood in your stool or on the toilet paper. If you underwent a bowel prep for your procedure, you may not have a normal bowel movement for a few days.  Please Note:  You might notice some irritation and congestion in your nose or some drainage.  This is from the oxygen used during your procedure.  There is no need for concern and it should clear up in a day or so.  SYMPTOMS TO REPORT IMMEDIATELY:   Following lower endoscopy (colonoscopy or flexible sigmoidoscopy):  Excessive amounts of blood in the stool  Significant tenderness or worsening of abdominal pains  Swelling of the abdomen that is new, acute  Fever of 100F or higher   For urgent or emergent issues, a gastroenterologist can be reached at any hour by calling [phone].   DIET: Your first meal following the procedure should be a small meal and then it is ok to progress to your normal diet. Heavy or fried foods are harder to digest and may make you feel nauseous or bloated.  Likewise, meals heavy in dairy and vegetables can increase bloating.  Drink plenty  of fluids but you should avoid alcoholic beverages for 24 hours.  ACTIVITY:  You should plan to take it easy for the rest of today and you should NOT DRIVE or use heavy machinery until tomorrow (because of the sedation medicines used during the test).    FOLLOW UP: Our staff will call the number listed on your records the next business day following your procedure to check on you and address any questions or concerns that you may have regarding the information given to you following your procedure. If we do not reach you, we will leave a message.  However, if you are feeling well and you are not experiencing any problems, there is no need to return our call.  We will assume that you have returned to your regular daily activities without incident.  If any biopsies were taken you will be contacted by phone or by letter within the next 1-3 weeks.  Please call us at [phone] if you have not heard about the biopsies in 3 weeks.    SIGNATURES/CONFIDENTIALITY: You and/or your care partner have signed paperwork which will be entered into your electronic medical record.  These signatures attest to the fact that that the information above on your After Visit Summary has been reviewed and is understood.  Full responsibility of the confidentiality of this discharge information lies with you and/or your care-partner. 

## 2015-02-09 NOTE — Op Note (Signed)
[place]  [address] [place], [address]   COLONOSCOPY PROCEDURE REPORT  PATIENT: Kathy, Cordial Channah King  MR#: [id number] BIRTHDATE: 04/26/1956 , 58  yrs. old GENDER: female ENDOSCOPIST: Yetta Flock, MD REFERRED BY: Gwendolyn Grant MD PROCEDURE DATE:  02/09/2015 PROCEDURE:   Colonoscopy, surveillance First Screening Colonoscopy - Avg.  risk and is 50 yrs.  old or older - No.  Prior Negative Screening - Now for repeat screening. N/A  History of Adenoma - Now for follow-up colonoscopy & has been > or = to 3 yrs.  Yes hx of adenoma.  Has been 3 or more years since last colonoscopy.  Polyps removed today? No Recommend repeat exam, <10 yrs? No ASA CLASS:   Class II INDICATIONS:Surveillance due to prior colonic neoplasia and Colorectal Neoplasm Risk Assessment for this procedure is average risk. MEDICATIONS: Propofol 250 mg IV  DESCRIPTION OF PROCEDURE:   After the risks benefits and alternatives of the procedure were thoroughly explained, informed consent was obtained.  The digital rectal exam revealed no abnormalities of the rectum.   The LB [id number]  endoscope was introduced through the anus and advanced to the cecum, which was identified by both the appendix and ileocecal valve. No adverse events experienced.   The quality of the prep was adequate  The instrument was then slowly withdrawn as the colon was fully examined. Estimated blood loss is zero unless otherwise noted in this procedure report.    COLON FINDINGS: A normal appearing cecum, ileocecal valve, and appendiceal orifice were identified.  The ascending, transverse, descending, sigmoid colon, and rectum appeared unremarkable.  No polyps or mass lesions noted. Retroflexed views revealed no abnormalities. The time to cecum = 3.1 Withdrawal time = 10.7   The scope was withdrawn and the procedure completed. COMPLICATIONS: There were no immediate complications.  ENDOSCOPIC  IMPRESSION: Normal colonoscopy  RECOMMENDATIONS: Resume diet Resume medications Repeat colonoscopy in 10 years for screening purposes  eSigned:  Yetta Flock, MD 02/09/2015 10:01 AM   cc: Gwendolyn Grant MD, the patient

## 2015-02-09 NOTE — Progress Notes (Deleted)
Dental advisory given to patient Kathy King, Therapist, sports.

## 2015-02-12 ENCOUNTER — Telehealth: Payer: Self-pay | Admitting: *Deleted

## 2015-02-12 NOTE — Telephone Encounter (Signed)
No answer, message left for the patient. 

## 2015-07-10 ENCOUNTER — Telehealth: Payer: Self-pay | Admitting: Physician Assistant

## 2015-07-10 MED ORDER — MUPIROCIN CALCIUM 2 % EX CREA: 1.0000 "application " | TOPICAL_CREAM | Freq: Two times a day (BID) | CUTANEOUS | Status: DC

## 2015-07-10 MED FILL — MUPIROCIN 2% OINTMENT: 2 | 12 days supply | Qty: 22 | Fill #0

## 2015-07-10 NOTE — Telephone Encounter (Signed)
Patient with mild nasal vestibulitis noted on examination of nares. Rx Mupirocin send down to pharmacy here. Supportive measures reviewed.

## 2015-09-12 DIAGNOSIS — Z6827 Body mass index (BMI) 27.0-27.9, adult: Secondary | ICD-10-CM | POA: Diagnosis not present

## 2015-09-12 DIAGNOSIS — Z01419 Encounter for gynecological examination (general) (routine) without abnormal findings: Secondary | ICD-10-CM | POA: Diagnosis not present

## 2015-09-12 LAB — HM PAP SMEAR: HM Pap smear: NORMAL

## 2015-09-26 DIAGNOSIS — H5203 Hypermetropia, bilateral: Secondary | ICD-10-CM | POA: Diagnosis not present

## 2015-10-16 ENCOUNTER — Encounter: Payer: Self-pay | Admitting: Behavioral Health

## 2015-10-16 ENCOUNTER — Telehealth: Payer: Self-pay | Admitting: Behavioral Health

## 2015-10-16 NOTE — Telephone Encounter (Signed)
Pre-Visit Call completed with patient and chart updated.   Pre-Visit Info documented in Specialty Comments under [employer].    

## 2015-10-17 ENCOUNTER — Encounter: Payer: Self-pay | Admitting: Family Medicine

## 2015-10-17 ENCOUNTER — Ambulatory Visit (INDEPENDENT_AMBULATORY_CARE_PROVIDER_SITE_OTHER): Payer: 59 | Admitting: Family Medicine

## 2015-10-17 VITALS — BP 112/64 | HR 64 | Temp 98.6°F | Ht 64.0 in | Wt 162.8 lb

## 2015-10-17 DIAGNOSIS — Z131 Encounter for screening for diabetes mellitus: Secondary | ICD-10-CM

## 2015-10-17 DIAGNOSIS — Z13220 Encounter for screening for lipoid disorders: Secondary | ICD-10-CM

## 2015-10-17 DIAGNOSIS — Z1329 Encounter for screening for other suspected endocrine disorder: Secondary | ICD-10-CM | POA: Diagnosis not present

## 2015-10-17 DIAGNOSIS — Z130 Encounter for screening for diseases of the blood and blood-forming organs and certain disorders involving the immune mechanism: Secondary | ICD-10-CM | POA: Diagnosis not present

## 2015-10-17 DIAGNOSIS — M5442 Lumbago with sciatica, left side: Secondary | ICD-10-CM | POA: Diagnosis not present

## 2015-10-17 MED ORDER — MELOXICAM 15 MG PO TABS: 15.0000 mg | ORAL_TABLET | Freq: Every day | ORAL | Status: DC

## 2015-10-17 MED FILL — MELOXICAM 15 MG TABLET: 15 | 30 days supply | Qty: 30 | Fill #0

## 2015-10-17 NOTE — Progress Notes (Signed)
[place] at [facility] [address], [place], [place] [address] [phone]  Date:  10/17/2015   Name:  Kathy King   DOB:  February 05, 1956   MRN:  [id number]  PCP:  Lamar Blinks, MD    Chief Complaint: Establish Care   History of Present Illness:  Kathy King is a 60 y.o. very pleasant female patient who presents with the following:  Here today to establish care. History of vitamin d def, lumbar pain, left sided sciatica.  She had been a pt of Dr. 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  Her GYN is Dr. Gaetano Net.  She was just seen a couple of weeks ago.    She has had trouble with her left hip for about 7 years- this started when they got a new dog and she was walking more.  She has done PT, had a couple of injections into the trochanteric bursitis.  She has used meloxicam for a long time. They became suspicious that her back was part of the issue and could be contributing to her pain.    Right now she takes ibuprofen or tylenol.  She has not used the meloxicam in a while.  She did feel like this worked well so she would like to have an rx to have on hand  She does have lower back pain on a daily basis, has to manage her activities and wear the right footwear to contol her pain.  No leg weakness or numbness She is active, gets a fair amount of exercise by walking and doing yard work Her left hip does pop and she can feel something snapping when she goes up a hill  She has 6 children- ages 45- 82, and 4 grands so far.    Patient Active Problem List   Diagnosis Date Noted  . Viral URI with cough 01/30/2015  . Vitamin D deficiency   . Cough variant asthma   . Trochanteric bursitis of left hip 10/22/2011  . Lumbar radiculopathy 10/22/2011  . SCIATICA, LEFT 08/09/2009  . OSTEOPENIA 08/09/2009    Past Medical History  Diagnosis Date  . CHICKENPOX, HX OF   . OSTEOPENIA   . SCIATICA, LEFT  08/09/2009    s/p PT, NSAIDs  . Unspecified vitamin D deficiency   . Adenomatous colon polyp     tubular    Past Surgical History  Procedure Laterality Date  . Tonsillectomy  1965  . Cesarean section  88 & 93    x's 2  . Cervical fusion  2006  . Arthroscopic shoulder  2008    Social History  Substance Use Topics  . Smoking status: Never Smoker   . Smokeless tobacco: Never Used  . Alcohol Use: No    Family History  Problem Relation Age of Onset  . Arthritis Other     grandparent  . Breast cancer Maternal Grandmother   . Memory loss Mother   . Hypertension Mother   . Hyperlipidemia Mother   . Seizures Son     Allergies  Allergen Reactions  . Sulfonamide Derivatives     REACTION: unspecified; as child    Medication list has been reviewed and updated.  Current Outpatient Prescriptions on File Prior to Visit  Medication Sig Dispense Refill  . calcium gluconate 500 MG tablet Take 500 mg by mouth daily.      . Cholecalciferol (VITAMIN D)  2000 UNITS CAPS Take by mouth.      . Melatonin 3 MG TABS Take 2 tablets by mouth daily.    . SUMAtriptan (IMITREX) 50 MG tablet Take 1 tablet (50 mg total) by mouth every 2 (two) hours as needed for migraine. May repeat in 2 hours if headache persists or recurs. 10 tablet 0   No current facility-administered medications on file prior to visit.    Review of Systems:  As per HPI- otherwise negative.   Physical Examination: Filed Vitals:   10/17/15 1520  BP: 112/64  Pulse: 64  Temp: 98.6 F (37 C)   Filed Vitals:   10/17/15 1520  Height: 5\' 4"  (1.626 m)  Weight: 162 lb 12.8 oz (73.846 kg)   Body mass index is 27.93 kg/(m^2). Ideal Body Weight: Weight in (lb) to have BMI = 25: 145.3  GEN: WDWN, NAD, Non-toxic, A & O x 3, mild overweight, looks well HEENT: Atraumatic, Normocephalic. Neck supple. No masses, No LAD.  Bilateral TM wnl, oropharynx normal.  PEERL,EOMI.   Ears and Nose: No external deformity. CV: RRR, No  M/G/R. No JVD. No thrill. No extra heart sounds. PULM: CTA B, no wheezes, crackles, rhonchi. No retractions. No resp. distress. No accessory muscle use. ABD: S, NT, ND, +BS. No rebound. No HSM. EXTR: No c/c/e NEURO Normal gait.  PSYCH: Normally interactive. Conversant. Not depressed or anxious appearing.  Calm demeanor.    Assessment and Plan: Midline low back pain with left-sided sciatica - Plan: meloxicam (MOBIC) 15 MG tablet  Screening for thyroid disorder - Plan: TSH  Screening for diabetes mellitus - Plan: Comprehensive metabolic panel, Hemoglobin A1c  Screening for deficiency anemia - Plan: CBC  Screening for hyperlipidemia - Plan: Lipid panel  Labs ordered - she plans to have BW tomorrow rx for mobic to use on days when her pain is worse.  For the time being she declines PT or other more aggressive evaluation of her back pain but she will let me know if she does desire anything more in this regard  Signed Lamar Blinks, MD

## 2015-10-17 NOTE — Progress Notes (Signed)
Pre visit review using our clinic review tool, if applicable. No additional management support is needed unless otherwise documented below in the visit note. 

## 2015-11-19 ENCOUNTER — Other Ambulatory Visit: Payer: Self-pay | Admitting: Emergency Medicine

## 2015-11-19 DIAGNOSIS — M5442 Lumbago with sciatica, left side: Secondary | ICD-10-CM

## 2015-11-19 MED ORDER — MELOXICAM 15 MG PO TABS: 15.0000 mg | ORAL_TABLET | Freq: Every day | ORAL | 1 refills | Status: DC

## 2015-11-19 MED FILL — MELOXICAM 15 MG TABLET: 15 | 90 days supply | Qty: 90 | Fill #0

## 2015-11-19 NOTE — Telephone Encounter (Signed)
Received refill request from pt for Meloxicam. Pt would like to know if she could get a 90 day supply.

## 2015-12-13 ENCOUNTER — Encounter: Payer: Self-pay | Admitting: Family Medicine

## 2015-12-13 ENCOUNTER — Telehealth: Payer: Self-pay | Admitting: Family Medicine

## 2015-12-13 DIAGNOSIS — Z13220 Encounter for screening for lipoid disorders: Secondary | ICD-10-CM

## 2015-12-13 DIAGNOSIS — Z1329 Encounter for screening for other suspected endocrine disorder: Secondary | ICD-10-CM

## 2015-12-13 DIAGNOSIS — Z8639 Personal history of other endocrine, nutritional and metabolic disease: Secondary | ICD-10-CM

## 2015-12-13 DIAGNOSIS — Z130 Encounter for screening for diseases of the blood and blood-forming organs and certain disorders involving the immune mechanism: Secondary | ICD-10-CM

## 2015-12-13 DIAGNOSIS — Z131 Encounter for screening for diabetes mellitus: Secondary | ICD-10-CM

## 2015-12-13 NOTE — Telephone Encounter (Signed)
-----   Message from Emi Holes, [place] sent at 12/13/2015 11:49 AM EDT ----- Pt would like for you to order routine labs for her including Vit D due to hx of vit d def. She will have blood work done within the next few days.

## 2015-12-17 ENCOUNTER — Other Ambulatory Visit (INDEPENDENT_AMBULATORY_CARE_PROVIDER_SITE_OTHER): Payer: 59

## 2015-12-17 DIAGNOSIS — Z130 Encounter for screening for diseases of the blood and blood-forming organs and certain disorders involving the immune mechanism: Secondary | ICD-10-CM | POA: Diagnosis not present

## 2015-12-17 DIAGNOSIS — Z131 Encounter for screening for diabetes mellitus: Secondary | ICD-10-CM

## 2015-12-17 DIAGNOSIS — Z8639 Personal history of other endocrine, nutritional and metabolic disease: Secondary | ICD-10-CM

## 2015-12-17 DIAGNOSIS — Z1329 Encounter for screening for other suspected endocrine disorder: Secondary | ICD-10-CM

## 2015-12-17 LAB — COMPREHENSIVE METABOLIC PANEL
ALT: 32 U/L (ref 0–35)
AST: 28 U/L (ref 0–37)
Albumin: 4.2 g/dL (ref 3.5–5.2)
Alkaline Phosphatase: 71 U/L (ref 39–117)
BUN: 19 mg/dL (ref 6–23)
CO2: 28 mEq/L (ref 19–32)
Calcium: 9 mg/dL (ref 8.4–10.5)
Chloride: 105 mEq/L (ref 96–112)
Creatinine, Ser: 0.86 mg/dL (ref 0.40–1.20)
GFR: 72.12 mL/min (ref >60.00)
Glucose, Bld: 77 mg/dL (ref 70–99)
Potassium: 4 mEq/L (ref 3.5–5.1)
Sodium: 140 mEq/L (ref 135–145)
Total Bilirubin: 0.5 mg/dL (ref 0.2–1.2)
Total Protein: 6.1 g/dL (ref 6.0–8.3)

## 2015-12-17 LAB — CBC
HCT: 37.2 % (ref 36.0–46.0)
Hemoglobin: 12.5 g/dL (ref 12.0–15.0)
MCHC: 33.7 g/dL (ref 30.0–36.0)
MCV: 88.6 fl (ref 78.0–100.0)
Platelets: 187 10*3/uL (ref 150.0–400.0)
RBC: 4.2 Mil/uL (ref 3.87–5.11)
RDW: 13.5 % (ref 11.5–15.5)
WBC: 5.4 10*3/uL (ref 4.0–10.5)

## 2015-12-17 LAB — HEMOGLOBIN A1C: Hgb A1c MFr Bld: 5.3 % (ref 4.6–6.5)

## 2015-12-17 LAB — TSH: TSH: 2.94 u[IU]/mL (ref 0.35–4.50)

## 2015-12-17 LAB — VITAMIN D 25 HYDROXY (VIT D DEFICIENCY, FRACTURES): VITD: 38.89 ng/mL (ref 30.00–100.00)

## 2015-12-18 ENCOUNTER — Other Ambulatory Visit (INDEPENDENT_AMBULATORY_CARE_PROVIDER_SITE_OTHER): Payer: 59

## 2015-12-18 DIAGNOSIS — E785 Hyperlipidemia, unspecified: Secondary | ICD-10-CM | POA: Diagnosis not present

## 2015-12-18 LAB — LIPID PANEL
Cholesterol: 180 mg/dL (ref 0–200)
HDL: 83.8 mg/dL (ref >39.00)
LDL Cholesterol: 88 mg/dL (ref 0–99)
NonHDL: 96.12
Total CHOL/HDL Ratio: 2
Triglycerides: 39 mg/dL (ref 0.0–149.0)
VLDL: 7.8 mg/dL (ref 0.0–40.0)

## 2016-01-03 ENCOUNTER — Other Ambulatory Visit: Payer: Self-pay | Admitting: Obstetrics and Gynecology

## 2016-01-03 DIAGNOSIS — Z1231 Encounter for screening mammogram for malignant neoplasm of breast: Secondary | ICD-10-CM

## 2016-01-23 ENCOUNTER — Ambulatory Visit
Admission: RE | Admit: 2016-01-23 | Discharge: 2016-01-23 | Disposition: A | Discharge status: Home / Self Care | Payer: 59 | Source: Ambulatory Visit | Attending: Obstetrics and Gynecology | Admitting: Obstetrics and Gynecology

## 2016-01-23 ENCOUNTER — Ambulatory Visit: Payer: 59

## 2016-01-23 DIAGNOSIS — Z1231 Encounter for screening mammogram for malignant neoplasm of breast: Secondary | ICD-10-CM | POA: Diagnosis not present

## 2016-01-24 ENCOUNTER — Ambulatory Visit (INDEPENDENT_AMBULATORY_CARE_PROVIDER_SITE_OTHER): Payer: 59 | Admitting: Family Medicine

## 2016-01-24 ENCOUNTER — Encounter: Payer: Self-pay | Admitting: Family Medicine

## 2016-01-24 VITALS — BP 112/70 | HR 77 | Temp 98.0°F | Ht 64.0 in | Wt 167.0 lb

## 2016-01-24 DIAGNOSIS — R140 Abdominal distension (gaseous): Secondary | ICD-10-CM

## 2016-01-24 DIAGNOSIS — R1011 Right upper quadrant pain: Secondary | ICD-10-CM

## 2016-01-24 DIAGNOSIS — R1010 Upper abdominal pain, unspecified: Secondary | ICD-10-CM | POA: Diagnosis not present

## 2016-01-24 NOTE — Progress Notes (Signed)
Pre visit review using our clinic review tool, if applicable. No additional management support is needed unless otherwise documented below in the visit note. 

## 2016-01-24 NOTE — Patient Instructions (Signed)
It was good to see you today!  I will arrange an ultrasound of your abdomen/ gallbladder and we will check a lipase for you.  We may want to do an h pylori breath test once you are off PPI (prilosec) for 2 weeks.  Avoid taking PPI medication for 2 weeks - zantac or another H2 blocker is ok as needed for symptoms (can just be held for 2 days prior to breath test).   Please keep me posted concerning any worsening or change in your symptoms

## 2016-01-24 NOTE — Progress Notes (Signed)
[place] at [facility] [address] [place], [place] [address] [phone]  Date:  01/24/2016   Name:  Kathy King   DOB:  1955-07-30   MRN:  [id number]  PCP:  Lamar Blinks, MD    Chief Complaint: Bloated (c/o bloating noticed occ after eating. )   History of Present Illness:  Kathy King is a 60 y.o. very pleasant female patient who presents with the following:  Generally healthy woman who has noted some post- prandial boating and discomfort recently Last night she noted discomfort after eating and took a couple of prilosec.  This did seem to help and she felt better in an hour or so She will use prilosec off and on for these sx She feels like carbs can make it worse,but last night she ate very little carbs and still had the discomfort- this prompted her to make this appt She has noted this problem off an on for 2-3 months.   She is not having what she would describe as abd pain, just bloating and fullness She notes a possible feeling of early satiety Burping can help relieve her sx She does tend to eat lightly during the day and then will eat a larger meal at night when she gets home No vomiting.  No bowel changes.  She will have looser stools when she eats yogurt.  She has gained a few lbs-  She is not really trying to watch her weight recently   She is not aware of any family history of gallbladder concerns The bloating sx may occur about 2x a day, will clear up in 1-2 hours.   She is using mobic daily for her lower back and hip pain- this does help with her joint pains but she knows it could bother her stomach Wt Readings from Last 3 Encounters:  01/24/16 167 lb (75.8 kg)  10/17/15 162 lb 12.8 oz (73.8 kg)  02/09/15 154 lb (69.9 kg)    She feels fine today- ate lightly at lunch  No CP, chest tightness or SOB  Patient Active Problem List   Diagnosis Date Noted  . Viral URI with cough 01/30/2015  . Vitamin D  deficiency   . Cough variant asthma   . Trochanteric bursitis of left hip 10/22/2011  . Lumbar radiculopathy 10/22/2011  . SCIATICA, LEFT 08/09/2009  . OSTEOPENIA 08/09/2009    Past Medical History:  Diagnosis Date  . Adenomatous colon polyp    tubular  . CHICKENPOX, HX OF   . OSTEOPENIA   . SCIATICA, LEFT 08/09/2009   s/p PT, NSAIDs  . Unspecified vitamin D deficiency     Past Surgical History:  Procedure Laterality Date  . Arthroscopic shoulder  2008  . CERVICAL FUSION  2006  . CESAREAN SECTION  88 & 93   x's 2  . TONSILLECTOMY  1965    Social History  Substance Use Topics  . Smoking status: Never Smoker  . Smokeless tobacco: Never Used  . Alcohol use No    Family History  Problem Relation Age of Onset  . Arthritis Other     grandparent  . Breast cancer Maternal Grandmother   . Memory loss Mother   . Hypertension Mother   . Hyperlipidemia Mother   . Seizures Son     Allergies  Allergen Reactions  . Sulfonamide Derivatives     REACTION: unspecified; as child    Medication list has been reviewed and  updated.  Current Outpatient Prescriptions on File Prior to Visit  Medication Sig Dispense Refill  . calcium gluconate 500 MG tablet Take 500 mg by mouth daily.      . Cholecalciferol (VITAMIN D) 2000 UNITS CAPS Take by mouth.      . Melatonin 3 MG TABS Take 2 tablets by mouth daily.    . meloxicam (MOBIC) 15 MG tablet Take 1 tablet (15 mg total) by mouth daily. 90 tablet 1  . SUMAtriptan (IMITREX) 50 MG tablet Take 1 tablet (50 mg total) by mouth every 2 (two) hours as needed for migraine. May repeat in 2 hours if headache persists or recurs. 10 tablet 0   No current facility-administered medications on file prior to visit.     Review of Systems:  As per HPI- otherwise negative.   Physical Examination: Vitals:   01/24/16 1257  BP: 112/70  Pulse: 77  Temp: 98 F (36.7 C)   Vitals:   01/24/16 1257  Weight: 167 lb (75.8 kg)  Height: 5\' 4"   (1.626 m)   Body mass index is 28.67 kg/m. Ideal Body Weight: Weight in (lb) to have BMI = 25: 145.3  GEN: WDWN, NAD, Non-toxic, A & O x 3, looks well HEENT: Atraumatic, Normocephalic. Neck supple. No masses, No LAD. Ears and Nose: No external deformity. CV: RRR, No M/G/R. No JVD. No thrill. No extra heart sounds. PULM: CTA B, no wheezes, crackles, rhonchi. No retractions. No resp. distress. No accessory muscle use. ABD: S, NT, ND, +BS. No rebound. No HSM.  She admits to mild discomfort with firm palpation in the RUQ and epigastrium but denies any pain EXTR: No c/c/e NEURO Normal gait.  PSYCH: Normally interactive. Conversant. Not depressed or anxious appearing.  Calm demeanor.    Assessment and Plan: RUQ discomfort - Plan: US Abdomen Complete  Abdominal bloating - Plan: Lipase  Here today with a feeling of bloating and early satiety, belching.  Need to eval for evidence of pancreatitis, gallstones, gastritis, gerd   It was good to see you today!  I will arrange an ultrasound of your abdomen/ gallbladder and we will check a lipase for you.  We may want to do an h pylori breath test once you are off PPI (prilosec) for 2 weeks.  Avoid taking PPI medication for 2 weeks - zantac or another H2 blocker is ok as needed for symptoms (can just be held for 2 days prior to breath test).   Please keep me posted concerning any worsening or change in your symptoms   Signed Lamar Blinks, MD

## 2016-01-25 ENCOUNTER — Ambulatory Visit (HOSPITAL_BASED_OUTPATIENT_CLINIC_OR_DEPARTMENT_OTHER): Payer: 59

## 2016-03-13 ENCOUNTER — Encounter: Payer: Self-pay | Admitting: Family Medicine

## 2016-03-13 ENCOUNTER — Ambulatory Visit: Payer: 59 | Admitting: Family Medicine

## 2016-03-13 ENCOUNTER — Ambulatory Visit (HOSPITAL_BASED_OUTPATIENT_CLINIC_OR_DEPARTMENT_OTHER)
Admission: RE | Admit: 2016-03-13 | Discharge: 2016-03-13 | Disposition: A | Discharge status: Home / Self Care | Payer: 59 | Source: Ambulatory Visit | Attending: Family Medicine | Admitting: Family Medicine

## 2016-03-13 ENCOUNTER — Ambulatory Visit (INDEPENDENT_AMBULATORY_CARE_PROVIDER_SITE_OTHER): Payer: 59 | Admitting: Family Medicine

## 2016-03-13 VITALS — BP 122/66 | HR 85 | Temp 98.4°F | Ht 64.0 in | Wt 164.0 lb

## 2016-03-13 DIAGNOSIS — J208 Acute bronchitis due to other specified organisms: Secondary | ICD-10-CM

## 2016-03-13 DIAGNOSIS — B9689 Other specified bacterial agents as the cause of diseases classified elsewhere: Secondary | ICD-10-CM | POA: Diagnosis not present

## 2016-03-13 DIAGNOSIS — R05 Cough: Secondary | ICD-10-CM | POA: Diagnosis not present

## 2016-03-13 DIAGNOSIS — R059 Cough, unspecified: Secondary | ICD-10-CM

## 2016-03-13 MED ORDER — AZITHROMYCIN 250 MG PO TABS
ORAL_TABLET | ORAL | 0 refills | Status: DC
Start: 2016-03-13 — End: 2016-05-05

## 2016-03-13 MED ORDER — BENZONATATE 100 MG PO CAPS: 100.0000 mg | ORAL_CAPSULE | Freq: Three times a day (TID) | ORAL | 0 refills | Status: DC | PRN

## 2016-03-13 MED ORDER — ALBUTEROL SULFATE 108 (90 BASE) MCG/ACT IN AEPB: 2.0000 | INHALATION_SPRAY | Freq: Four times a day (QID) | RESPIRATORY_TRACT | 1 refills | Status: DC | PRN

## 2016-03-13 MED FILL — BENZONATATE 100 MG CAPSULE: 100 | 7 days supply | Qty: 20 | Fill #0

## 2016-03-13 NOTE — Patient Instructions (Signed)
Take the [facility] and inhaler over the next 5-7 days. If no improvement, OK to try the antibiotic.  If you continue to have a cough, return to clinic. Some causes of chronic cough include post-nasal drip (like in allergies), GERD, and asthma/COPD. If the inhaler helps, let us know.  Here are some options to help with the ear sensation and possible post-nasal drip: Claritin (loratadine), Allegra (fexofenadine), Zyrtec (cetirizine); these are listed in order from weakest to strongest. Generic, and therefore cheaper, options are in the parentheses.   Flonase (fluticasone); nasal spray that is over the counter. 2 sprays each nostril, once daily. Aim towards the same side eye when you spray.

## 2016-03-13 NOTE — Progress Notes (Signed)
Chief Complaint  Patient presents with  . Cough    Kathy King here for URI complaints.  Duration: 5 days  Associated symptoms: sinus congestion, rhinorrhea, popping in ears, and cough Denies: itchy watery eyes, ear drainage, sore throat, shortness of breath and chest pain Treatment to date: fluids and rest; Mucinex, Hycodan syrup Sick contacts: No   Of note, she has had a dry cough over the past 2-3 mo. It is worse in the cold. No hx of GERD, does not feel as though things are dripping in her throat, she does not smoke, no hx of asthma/COPD. Had a long spell of coughing in the past and notes that albuterol was helpful.  ROS:  Const: Denies fevers HEENT: As noted in HPI  Lungs: No SOB  Past Medical History:  Diagnosis Date  . Adenomatous colon polyp    tubular  . CHICKENPOX, HX OF   . OSTEOPENIA   . SCIATICA, LEFT 08/09/2009   s/p PT, NSAIDs  . Unspecified vitamin D deficiency    Family History  Problem Relation Age of Onset  . Arthritis Other     grandparent  . Breast cancer Maternal Grandmother   . Memory loss Mother   . Hypertension Mother   . Hyperlipidemia Mother   . Seizures Son     BP 122/66 (BP Location: Right Arm, Patient Position: Sitting, Cuff Size: Small)   Pulse 85   Temp 98.4 F (36.9 C) (Oral)   Ht 5\' 4"  (1.626 m)   Wt 164 lb (74.4 kg)   SpO2 98%   BMI 28.15 kg/m  General: Awake, alert, appears stated age HEENT: AT, [place], ears patent b/l and TM's neg, nares patent w/o discharge, pharynx pink and without exudates, MMM Neck: No masses or asymmetry Heart: RRR, no murmurs, no bruits Lungs: CTAB, no accessory muscle use Psych: Age appropriate judgment and insight, normal mood and affect  Acute bacterial bronchitis - Plan: benzonatate (TESSALON) 100 MG capsule, Albuterol Sulfate 108 (90 Base) MCG/ACT AEPB, azithromycin (ZITHROMAX) 250 MG tablet  Cough - Plan: DG Chest 2 View  Orders as above. See AVS for instructions. Chronic cough workup if  things do not improve. If inhaler helpful, would start with PFT's.  F/u in around 4 weeks. Pt voiced understanding and agreement to the plan.  [place], DO 03/13/16 10:22 AM

## 2016-04-03 MED FILL — MELOXICAM 15 MG TABLET: 15 | 90 days supply | Qty: 90 | Fill #1

## 2016-04-10 MED FILL — AZITHROMYCIN 250 MG TABLET: 250 | 5 days supply | Qty: 6 | Fill #0

## 2016-05-05 ENCOUNTER — Encounter: Payer: Self-pay | Admitting: Family Medicine

## 2016-05-05 ENCOUNTER — Ambulatory Visit (INDEPENDENT_AMBULATORY_CARE_PROVIDER_SITE_OTHER): Payer: 59 | Admitting: Family Medicine

## 2016-05-05 VITALS — BP 114/58 | HR 92 | Temp 99.2°F | Ht 64.0 in | Wt 167.0 lb

## 2016-05-05 DIAGNOSIS — J189 Pneumonia, unspecified organism: Secondary | ICD-10-CM

## 2016-05-05 MED ORDER — HYDROCOD POLST-CPM POLST ER 10-8 MG/5ML PO SUER: 5.0000 mL | Freq: Every evening | ORAL | 0 refills | Status: DC | PRN

## 2016-05-05 MED ORDER — AZITHROMYCIN 250 MG PO TABS: ORAL_TABLET | ORAL | 0 refills | Status: DC

## 2016-05-05 MED FILL — HYDROCODONE-CHLORPHENIRAM S: 10-8 | 28 days supply | Qty: 140 | Fill #0

## 2016-05-05 MED FILL — AZITHROMYCIN 250 MG TABLET: 250 | 5 days supply | Qty: 6 | Fill #0

## 2016-05-05 NOTE — Progress Notes (Signed)
Pre visit review using our clinic review tool, if applicable. No additional management support is needed unless otherwise documented below in the visit note. 

## 2016-05-05 NOTE — Progress Notes (Signed)
Chief Complaint  Patient presents with  . Cough    Pt reports cough and fever with body aches  since saturday afternoon     Kathy King here for URI complaints.  Duration: 2 days  Associated symptoms: subjective fever, runny nose, cough, muscle aches in chest  Denies: sinus headache, sinus congestion, itchy watery eyes, ear pain, ear drainage, sore throat and shortness of breath Treatment to date: ibuprofen Sick contacts: No  ROS:  Const: +fevers HEENT: As noted in HPI Lungs: No SOB  Past Medical History:  Diagnosis Date  . Adenomatous colon polyp    tubular  . CHICKENPOX, HX OF   . OSTEOPENIA   . SCIATICA, LEFT 08/09/2009   s/p PT, NSAIDs  . Unspecified vitamin D deficiency    Family History  Problem Relation Age of Onset  . Arthritis Other     grandparent  . Breast cancer Maternal Grandmother   . Memory loss Mother   . Hypertension Mother   . Hyperlipidemia Mother   . Seizures Son     BP (!) 114/58 (BP Location: Left Arm, Patient Position: Sitting, Cuff Size: Small)   Pulse 92   Temp 99.2 F (37.3 C) (Oral)   Ht 5\' 4"  (1.626 m)   Wt 167 lb (75.8 kg)   SpO2 98%   BMI 28.67 kg/m  General: Awake, alert, appears stated age HEENT: AT, [place], ears patent b/l and TM's neg, nares patent w/o discharge, pharynx pink and without exudates, MMM Neck: No masses or asymmetry Heart: RRR, no murmurs, no bruits Lungs: CTAB, no accessory muscle use Psych: Age appropriate judgment and insight, normal mood and affect  Community acquired pneumonia, unspecified laterality - Plan: chlorpheniramine-HYDROcodone (TUSSIONEX PENNKINETIC ER) 10-8 MG/5ML SUER, azithromycin (ZITHROMAX) 250 MG tablet  Orders as above. Continue to push fluids, practice good hand hygiene, cover mouth when coughing. F/u in 1 week if symptoms worsen or fail to improve. Pt voiced understanding and agreement to the plan.  [place], DO 05/05/16 8:12 AM

## 2016-05-05 NOTE — Patient Instructions (Addendum)
Keep hydrated, cover mouth, keep washing hands.  You are considered no longer contagious after 24 hours of being on antibiotics.

## 2016-05-06 ENCOUNTER — Telehealth: Payer: Self-pay | Admitting: *Deleted

## 2016-05-06 ENCOUNTER — Emergency Department (HOSPITAL_BASED_OUTPATIENT_CLINIC_OR_DEPARTMENT_OTHER): Payer: 59

## 2016-05-06 ENCOUNTER — Ambulatory Visit (INDEPENDENT_AMBULATORY_CARE_PROVIDER_SITE_OTHER): Payer: 59 | Admitting: Family Medicine

## 2016-05-06 ENCOUNTER — Encounter (HOSPITAL_BASED_OUTPATIENT_CLINIC_OR_DEPARTMENT_OTHER): Payer: Self-pay | Admitting: Emergency Medicine

## 2016-05-06 ENCOUNTER — Ambulatory Visit: Payer: 59 | Admitting: Internal Medicine

## 2016-05-06 ENCOUNTER — Emergency Department (HOSPITAL_BASED_OUTPATIENT_CLINIC_OR_DEPARTMENT_OTHER)
Admission: EM | Admit: 2016-05-06 | Discharge: 2016-05-06 | Disposition: A | Discharge status: Home / Self Care | Payer: 59 | Attending: Emergency Medicine | Admitting: Emergency Medicine

## 2016-05-06 VITALS — BP 124/58 | HR 93 | Temp 102.7°F | Wt 167.0 lb

## 2016-05-06 DIAGNOSIS — R69 Illness, unspecified: Secondary | ICD-10-CM

## 2016-05-06 DIAGNOSIS — J111 Influenza due to unidentified influenza virus with other respiratory manifestations: Secondary | ICD-10-CM | POA: Diagnosis not present

## 2016-05-06 DIAGNOSIS — R509 Fever, unspecified: Secondary | ICD-10-CM | POA: Insufficient documentation

## 2016-05-06 DIAGNOSIS — R0981 Nasal congestion: Secondary | ICD-10-CM | POA: Diagnosis not present

## 2016-05-06 DIAGNOSIS — R05 Cough: Secondary | ICD-10-CM | POA: Diagnosis not present

## 2016-05-06 DIAGNOSIS — J189 Pneumonia, unspecified organism: Secondary | ICD-10-CM | POA: Diagnosis not present

## 2016-05-06 LAB — CBC WITH DIFFERENTIAL/PLATELET
Basophils Absolute: 0 10*3/uL (ref 0.0–0.1)
Basophils Relative: 0 %
Eosinophils Absolute: 0 10*3/uL (ref 0.0–0.7)
Eosinophils Relative: 0 %
HCT: 37.4 % (ref 36.0–46.0)
Hemoglobin: 12.1 g/dL (ref 12.0–15.0)
Lymphocytes Relative: 7 %
Lymphs Abs: 0.6 10*3/uL — ABNORMAL LOW (ref 0.7–4.0)
MCH: 29.4 pg (ref 26.0–34.0)
MCHC: 32.4 g/dL (ref 30.0–36.0)
MCV: 90.8 fL (ref 78.0–100.0)
Monocytes Absolute: 0.7 10*3/uL (ref 0.1–1.0)
Monocytes Relative: 9 %
Neutro Abs: 6.6 10*3/uL (ref 1.7–7.7)
Neutrophils Relative %: 84 %
Platelets: 141 10*3/uL — ABNORMAL LOW (ref 150–400)
RBC: 4.12 MIL/uL (ref 3.87–5.11)
RDW: 12.9 % (ref 11.5–15.5)
WBC: 7.9 10*3/uL (ref 4.0–10.5)

## 2016-05-06 LAB — COMPREHENSIVE METABOLIC PANEL
ALT: 27 U/L (ref 14–54)
AST: 27 U/L (ref 15–41)
Albumin: 4.2 g/dL (ref 3.5–5.0)
Alkaline Phosphatase: 66 U/L (ref 38–126)
Anion gap: 10 (ref 5–15)
BUN: 12 mg/dL (ref 6–20)
CO2: 23 mmol/L (ref 22–32)
Calcium: 8.5 mg/dL — ABNORMAL LOW (ref 8.9–10.3)
Chloride: 99 mmol/L — ABNORMAL LOW (ref 101–111)
Creatinine, Ser: 0.79 mg/dL (ref 0.44–1.00)
GFR calc Af Amer: >60 mL/min (ref >60)
GFR calc non Af Amer: >60 mL/min (ref >60)
Glucose, Bld: 119 mg/dL — ABNORMAL HIGH (ref 65–99)
Potassium: 3.6 mmol/L (ref 3.5–5.1)
Sodium: 132 mmol/L — ABNORMAL LOW (ref 135–145)
Total Bilirubin: 0.6 mg/dL (ref 0.3–1.2)
Total Protein: 6.4 g/dL — ABNORMAL LOW (ref 6.5–8.1)

## 2016-05-06 MED ORDER — IBUPROFEN 800 MG PO TABS: 800.0000 mg | ORAL_TABLET | Freq: Once | ORAL | Status: DC

## 2016-05-06 MED ORDER — SODIUM CHLORIDE 0.9 % IV BOLUS (SEPSIS)
2000.0000 mL | Freq: Once | INTRAVENOUS | Status: AC
  Administered 2016-05-06: 2000 mL via INTRAVENOUS

## 2016-05-06 MED ORDER — IBUPROFEN 200 MG PO TABS
ORAL_TABLET | ORAL | Status: AC
  Filled 2016-05-06: qty 1

## 2016-05-06 MED ORDER — CEFTRIAXONE SODIUM 1 G IJ SOLR
1.0000 g | Freq: Once | INTRAMUSCULAR | Status: AC
  Administered 2016-05-06: 1 g via INTRAMUSCULAR

## 2016-05-06 MED ORDER — ACETAMINOPHEN 500 MG PO TABS
1000.0000 mg | ORAL_TABLET | Freq: Once | ORAL | Status: AC
  Administered 2016-05-06: 1000 mg via ORAL
  Filled 2016-05-06: qty 2

## 2016-05-06 MED ORDER — BENZONATATE 100 MG PO CAPS: 100.0000 mg | ORAL_CAPSULE | Freq: Three times a day (TID) | ORAL | 0 refills | Status: DC

## 2016-05-06 MED ORDER — IBUPROFEN 400 MG PO TABS
ORAL_TABLET | ORAL | Status: AC
  Administered 2016-05-06: 600 mg
  Filled 2016-05-06: qty 1

## 2016-05-06 MED FILL — BENZONATATE 100 MG CAP: 100 | 7 days supply | Qty: 21 | Fill #0

## 2016-05-06 NOTE — Progress Notes (Signed)
Pre visit review using our clinic review tool, if applicable. No additional management support is needed unless otherwise documented below in the visit note. 

## 2016-05-06 NOTE — Patient Instructions (Signed)
Community-Acquired Pneumonia, Adult °Introduction °Pneumonia is an infection of the lungs. One type of pneumonia can happen while a person is in a hospital. A different type can happen when a person is not in a hospital (community-acquired pneumonia). It is easy for this kind to spread from person to person. It can spread to you if you breathe near an infected person who coughs or sneezes. Some symptoms include: °· A dry cough. °· A wet (productive) cough. °· Fever. °· Sweating. °· Chest pain. °Follow these instructions at home: °· Take over-the-counter and prescription medicines only as told by your doctor. °¨ Only take cough medicine if you are losing sleep. °¨ If you were prescribed an antibiotic medicine, take it as told by your doctor. Do not stop taking the antibiotic even if you start to feel better. °· Sleep with your head and neck raised (elevated). You can do this by putting a few pillows under your head, or you can sleep in a recliner. °· Do not use tobacco products. These include cigarettes, chewing tobacco, and e-cigarettes. If you need help quitting, ask your doctor. °· Drink enough water to keep your pee (urine) clear or pale yellow. °A shot (vaccine) can help prevent pneumonia. Shots are often suggested for: °· People older than 59 years of age. °· People older than 59 years of age: °¨ Who are having cancer treatment. °¨ Who have long-term (chronic) lung disease. °¨ Who have problems with their body's defense system (immune system). °You may also prevent pneumonia if you take these actions: °· Get the flu (influenza) shot every year. °· Go to the dentist as often as told. °· Wash your hands often. If soap and water are not available, use hand sanitizer. °Contact a doctor if: °· You have a fever. °· You lose sleep because your cough medicine does not help. °Get help right away if: °· You are short of breath and it gets worse. °· You have more chest pain. °· Your sickness gets worse. This is very  serious if: °¨ You are an older adult. °¨ Your body's defense system is weak. °· You cough up blood. °This information is not intended to replace advice given to you by your health care provider. Make sure you discuss any questions you have with your health care provider. °Document Released: 10/01/2007 Document Revised: 09/20/2015 Document Reviewed: 08/09/2014 °© 2017 Elsevier ° °

## 2016-05-06 NOTE — Progress Notes (Signed)
Patient ID: Kathy King, female   DOB: 19-Jul-1955, 61 y.o.   MRN: [id number]   Subjective:    Patient ID: Kathy King, female    DOB: [birth year], 61 y.o.   MRN: [id number]  Chief Complaint  Patient presents with  . Cough    HPI Patient is in today for evaluation of worsening respiratory symptoms the Tussionex only partially helps the cough although after accidentally taking 2 tsp last night slept better. Unfortunately with Azithromycin and Ibuprofen on board she continues to worsen with hi grade fevers, headaches, mayalgias, fatigue and husband endorses confusion. Notes cough keeps her up and nasal congestion is significant. She is trying to drink fluids regularly but has not eaten in 2 days. Had one episode of nausea and vomiting this am.   Past Medical History:  Diagnosis Date  . Adenomatous colon polyp    tubular  . CHICKENPOX, HX OF   . OSTEOPENIA   . SCIATICA, LEFT 08/09/2009   s/p PT, NSAIDs  . Unspecified vitamin D deficiency     Past Surgical History:  Procedure Laterality Date  . Arthroscopic shoulder  2008  . CERVICAL FUSION  2006  . CESAREAN SECTION  88 & 93   x's 2  . TONSILLECTOMY  1965    Family History  Problem Relation Age of Onset  . Arthritis Other     grandparent  . Breast cancer Maternal Grandmother   . Memory loss Mother   . Hypertension Mother   . Hyperlipidemia Mother   . Seizures Son     Social History   Social History  . Marital status: Married    Spouse name: N/A  . Number of children: N/A  . Years of education: N/A   Occupational History  . Not on file.   Social History Main Topics  . Smoking status: Never Smoker  . Smokeless tobacco: Never Used  . Alcohol use No  . Drug use: No  . Sexual activity: Not on file   Other Topics Concern  . Not on file   Social History Narrative   Married, lives with spouse and kids    Outpatient Medications Prior to Visit  Medication Sig Dispense Refill  . Albuterol Sulfate 108 (90 Base)  MCG/ACT AEPB Inhale 2 puffs into the lungs every 6 (six) hours as needed. 1 each 1  . azithromycin (ZITHROMAX) 250 MG tablet Take 2 tabs the first day and then 1 tab daily until you run out. 6 tablet 0  . calcium gluconate 500 MG tablet Take 500 mg by mouth daily.      . chlorpheniramine-HYDROcodone (TUSSIONEX PENNKINETIC ER) 10-8 MG/5ML SUER Take 5 mLs by mouth at bedtime as needed for cough. 140 mL 0  . Cholecalciferol (VITAMIN D) 2000 UNITS CAPS Take by mouth.      . Melatonin 3 MG TABS Take 2 tablets by mouth daily.    . meloxicam (MOBIC) 15 MG tablet Take 1 tablet (15 mg total) by mouth daily. 90 tablet 1  . SUMAtriptan (IMITREX) 50 MG tablet Take 1 tablet (50 mg total) by mouth every 2 (two) hours as needed for migraine. May repeat in 2 hours if headache persists or recurs. 10 tablet 0   No facility-administered medications prior to visit.     Allergies  Allergen Reactions  . Sulfonamide Derivatives     REACTION: unspecified; as child    Review of Systems  Constitutional: Positive for fever and malaise/fatigue.  HENT: Positive for congestion. Negative for  ear pain and sore throat.   Eyes: Negative for blurred vision.  Respiratory: Positive for cough and sputum production.   Cardiovascular: Negative for chest pain and palpitations.  Gastrointestinal: Positive for nausea and vomiting. Negative for heartburn.  Musculoskeletal: Positive for myalgias. Negative for back pain.  Skin: Negative for rash.  Neurological: Positive for headaches. Negative for loss of consciousness.       Objective:    Physical Exam  Constitutional: She is oriented to person, place, and time. She appears well-developed and well-nourished. She appears distressed.  Ill appearing  HENT:  Head: Normocephalic and atraumatic.  Nose: Nose normal.  Dry mucus membranes  Eyes: Right eye exhibits no discharge. Left eye exhibits no discharge.  Eyes sunken  Neck: Normal range of motion. Neck supple.  B/l tender  with palp  Cardiovascular: Normal rate and regular rhythm.   No murmur heard. Pulmonary/Chest: Effort normal and breath sounds normal.  Rhonchi RLL other lung fields clear  Abdominal: Soft. Bowel sounds are normal. There is no tenderness.  Musculoskeletal: She exhibits no edema.  Lymphadenopathy:    She has cervical adenopathy.  Neurological: She is alert and oriented to person, place, and time.  Skin: Skin is warm and dry.  Psychiatric: She has a normal mood and affect.  Nursing note and vitals reviewed.   BP (!) 124/58 (BP Location: Right Arm, Patient Position: Sitting, Cuff Size: Normal)   Pulse 93   Temp (!) 102.7 F (39.3 C) (Oral)   Wt 167 lb (75.8 kg)   SpO2 97%   BMI 28.67 kg/m  Wt Readings from Last 3 Encounters:  05/06/16 167 lb (75.8 kg)  05/05/16 167 lb (75.8 kg)  03/13/16 164 lb (74.4 kg)     Lab Results  Component Value Date   WBC 5.4 12/17/2015   HGB 12.5 12/17/2015   HCT 37.2 12/17/2015   PLT 187.0 12/17/2015   GLUCOSE 77 12/17/2015   CHOL 180 12/18/2015   TRIG 39.0 12/18/2015   HDL 83.80 12/18/2015   LDLCALC 88 12/18/2015   ALT 32 12/17/2015   AST 28 12/17/2015   NA 140 12/17/2015   K 4.0 12/17/2015   CL 105 12/17/2015   CREATININE 0.86 12/17/2015   BUN 19 12/17/2015   CO2 28 12/17/2015   TSH 2.94 12/17/2015   HGBA1C 5.3 12/17/2015    Lab Results  Component Value Date   TSH 2.94 12/17/2015   Lab Results  Component Value Date   WBC 5.4 12/17/2015   HGB 12.5 12/17/2015   HCT 37.2 12/17/2015   MCV 88.6 12/17/2015   PLT 187.0 12/17/2015   Lab Results  Component Value Date   NA 140 12/17/2015   K 4.0 12/17/2015   CO2 28 12/17/2015   GLUCOSE 77 12/17/2015   BUN 19 12/17/2015   CREATININE 0.86 12/17/2015   BILITOT 0.5 12/17/2015   ALKPHOS 71 12/17/2015   AST 28 12/17/2015   ALT 32 12/17/2015   PROT 6.1 12/17/2015   ALBUMIN 4.2 12/17/2015   CALCIUM 9.0 12/17/2015   GFR 72.12 12/17/2015   Lab Results  Component Value Date    CHOL 180 12/18/2015   Lab Results  Component Value Date   HDL 83.80 12/18/2015   Lab Results  Component Value Date   LDLCALC 88 12/18/2015   Lab Results  Component Value Date   TRIG 39.0 12/18/2015   Lab Results  Component Value Date   CHOLHDL 2 12/18/2015   Lab Results  Component Value Date  HGBA1C 5.3 12/17/2015       Assessment & Plan:   Problem List Items Addressed This Visit    Pneumonia due to organism - Primary    Not responding to azithromycin, Tussionex and Ibuprofen. Very dehydrated. Will administer a 1 gm IM dose of Rocephin and sent to ER for IVF and further work up. Patient and husband agree with the plan      Relevant Medications   cefTRIAXone (ROCEPHIN) injection 1 g (Start on 05/06/2016  3:30 PM)      I am having Ms. Lecompte maintain her calcium gluconate, Vitamin D, SUMAtriptan, Melatonin, meloxicam, Albuterol Sulfate, chlorpheniramine-HYDROcodone, and azithromycin. We will continue to administer cefTRIAXone.  Meds ordered this encounter  Medications  . cefTRIAXone (ROCEPHIN) injection 1 g    Penni Homans, MD

## 2016-05-06 NOTE — ED Triage Notes (Signed)
Cough and fever x3 days.  Started Zithromax, 500mg  yesterday and 250 today.  Was given Rocephin IM 30 min ago by Dr. Randel Pigg.  Was sent to ED for fluids.

## 2016-05-06 NOTE — ED Notes (Signed)
ED Provider at bedside. 

## 2016-05-06 NOTE — Discharge Instructions (Signed)
Take tylenol 2 pills 4 times a day and motrin 4 pills 3 times a day.  Drink plenty of fluids.  Return for worsening shortness of breath, headache, confusion. Follow up with your family doctor.   

## 2016-05-06 NOTE — Telephone Encounter (Signed)
Dr. Jenny Reichmann... Kathy King has made appt for this afternoon @ 6. Saw PA and they said she has acquired pneumonia 7 gave her rx for Zpac. They did not do an xray, and she said she feels horrible. She is wanting to see if you can go ahead an order xray...Johny Chess

## 2016-05-06 NOTE — Assessment & Plan Note (Signed)
Not responding to azithromycin, Tussionex and Ibuprofen. Very dehydrated. Will administer a 1 gm IM dose of Rocephin and sent to ER for IVF and further work up. Patient and husband agree with the plan

## 2016-05-06 NOTE — ED Provider Notes (Signed)
[facility] Provider Note   CSN: [id number] Arrival date & time: 05/06/16  1536     History   Chief Complaint Chief Complaint  Patient presents with  . Cough    HPI Kathy King is a 61 y.o. female.  61 yo F with a chief complaint of cough congestion fever. This been going on for about a week. Patient feels that her symptoms are getting mildly worse. She saw a physician yesterday who prescribed her narcotic cough syrup azithromycin without improvement. Feeling worse today so her primary care doctor who gave her shot of Rocephin and central density ED. Patient denies vomiting denies abdominal pain denies diarrhea. Feel that her mouth is dry.   The history is provided by the patient and the spouse.  Cough  Pertinent negatives include no chest pain, no chills, no headaches, no rhinorrhea, no myalgias, no shortness of breath, no wheezing and no eye redness.  Illness  This is a new problem. The current episode started more than 1 week ago. The problem occurs constantly. The problem has been gradually worsening. Pertinent negatives include no chest pain, no headaches and no shortness of breath. Nothing aggravates the symptoms. Nothing relieves the symptoms. She has tried nothing for the symptoms. The treatment provided no relief.    Past Medical History:  Diagnosis Date  . Adenomatous colon polyp    tubular  . CHICKENPOX, HX OF   . OSTEOPENIA   . SCIATICA, LEFT 08/09/2009   s/p PT, NSAIDs  . Unspecified vitamin D deficiency     Patient Active Problem List   Diagnosis Date Noted  . Pneumonia due to organism 05/06/2016  . Viral URI with cough 01/30/2015  . Vitamin D deficiency   . Cough variant asthma   . Trochanteric bursitis of left hip 10/22/2011  . Lumbar radiculopathy 10/22/2011  . SCIATICA, LEFT 08/09/2009  . OSTEOPENIA 08/09/2009    Past Surgical History:  Procedure Laterality Date  . Arthroscopic shoulder  2008  . CERVICAL FUSION  2006  . CESAREAN  SECTION  88 & 93   x's 2  . TONSILLECTOMY  1965    OB History    No data available       Home Medications    Prior to Admission medications   Medication Sig Start Date End Date Taking? Authorizing Provider  Albuterol Sulfate 108 (90 Base) MCG/ACT AEPB Inhale 2 puffs into the lungs every 6 (six) hours as needed. 03/13/16   Shelda Pal, DO  azithromycin (ZITHROMAX) 250 MG tablet Take 2 tabs the first day and then 1 tab daily until you run out. 05/05/16   Shelda Pal, DO  benzonatate (TESSALON) 100 MG capsule Take 1 capsule (100 mg total) by mouth every 8 (eight) hours. 05/06/16   Deno Etienne, DO  calcium gluconate 500 MG tablet Take 500 mg by mouth daily.      Historical Provider, MD  chlorpheniramine-HYDROcodone (TUSSIONEX PENNKINETIC ER) 10-8 MG/5ML SUER Take 5 mLs by mouth at bedtime as needed for cough. 05/05/16   Shelda Pal, DO  Cholecalciferol (VITAMIN D) 2000 UNITS CAPS Take by mouth.      Historical Provider, MD  Melatonin 3 MG TABS Take 2 tablets by mouth daily.    Historical Provider, MD  meloxicam (MOBIC) 15 MG tablet Take 1 tablet (15 mg total) by mouth daily. 11/19/15   Gay Filler Copland, MD  SUMAtriptan (IMITREX) 50 MG tablet Take 1 tablet (50 mg total) by mouth every 2 (  two) hours as needed for migraine. May repeat in 2 hours if headache persists or recurs. 01/19/15   Midge Minium, MD    Family History Family History  Problem Relation Age of Onset  . Memory loss Mother   . Hypertension Mother   . Hyperlipidemia Mother   . Arthritis Other     grandparent  . Breast cancer Maternal Grandmother   . Seizures Son     Social History Social History  Substance Use Topics  . Smoking status: Never Smoker  . Smokeless tobacco: Never Used  . Alcohol use No     Allergies   Sulfonamide derivatives   Review of Systems Review of Systems  Constitutional: Positive for fever. Negative for chills.  HENT: Positive for congestion. Negative for  rhinorrhea.   Eyes: Negative for redness and visual disturbance.  Respiratory: Positive for cough. Negative for shortness of breath and wheezing.   Cardiovascular: Negative for chest pain and palpitations.  Gastrointestinal: Negative for nausea and vomiting.  Genitourinary: Negative for dysuria and urgency.  Musculoskeletal: Negative for arthralgias and myalgias.  Skin: Negative for pallor and wound.  Neurological: Negative for dizziness and headaches.     Physical Exam Updated Vital Signs BP 109/66 (BP Location: Left Arm)   Pulse 89   Temp 99.3 F (37.4 C) (Oral)   Resp 18   Ht 5\' 4"  (1.626 m)   Wt 167 lb (75.8 kg)   SpO2 94%   BMI 28.67 kg/m   Physical Exam  Constitutional: She is oriented to person, place, and time. She appears well-developed and well-nourished. No distress.  HENT:  Head: Normocephalic and atraumatic.  Dry mucous membranes.  Swollen turbinates, posterior nasal drip, no noted sinus ttp, tm normal bilaterally.    Eyes: EOM are normal. Pupils are equal, round, and reactive to light.  Neck: Normal range of motion. Neck supple.  Cardiovascular: Normal rate and regular rhythm.  Exam reveals no gallop and no friction rub.   No murmur heard. Pulmonary/Chest: Effort normal. She has no wheezes. She has no rales.  Abdominal: Soft. She exhibits no distension and no mass. There is no tenderness. There is no guarding.  Musculoskeletal: She exhibits no edema or tenderness.  Neurological: She is alert and oriented to person, place, and time.  Skin: Skin is warm and dry. She is not diaphoretic.  Psychiatric: She has a normal mood and affect. Her behavior is normal.  Nursing note and vitals reviewed.    ED Treatments / Results  Labs (all labs ordered are listed, but only abnormal results are displayed) Labs Reviewed  CBC WITH DIFFERENTIAL/PLATELET - Abnormal; Notable for the following:       Result Value   Platelets 141 (*)    Lymphs Abs 0.6 (*)    All other  components within normal limits  COMPREHENSIVE METABOLIC PANEL - Abnormal; Notable for the following:    Sodium 132 (*)    Chloride 99 (*)    Glucose, Bld 119 (*)    Calcium 8.5 (*)    Total Protein 6.4 (*)    All other components within normal limits    EKG  EKG Interpretation None       Radiology Dg Chest 2 View  Result Date: 05/06/2016 CLINICAL DATA:  Cough, fever for 3 days EXAM: CHEST  2 VIEW COMPARISON:  Chest x-ray of 03/13/2016 FINDINGS: No active infiltrate or effusion is seen. Mediastinal and hilar contours are unremarkable. The heart is within normal limits in size. No  acute bony abnormality is seen. A lower anterior cervical spine fusion plate is present. IMPRESSION: No active cardiopulmonary disease. Electronically Signed   By: Ivar Drape M.D.   On: 05/06/2016 16:36    Procedures Procedures (including critical care time)  Medications Ordered in ED Medications  ibuprofen (ADVIL,MOTRIN) tablet 800 mg (800 mg Oral Not Given 05/06/16 1622)  ibuprofen (ADVIL,MOTRIN) 200 MG tablet (not administered)  sodium chloride 0.9 % bolus 2,000 mL (2,000 mLs Intravenous New Bag/Given 05/06/16 1609)  acetaminophen (TYLENOL) tablet 1,000 mg (1,000 mg Oral Given 05/06/16 1610)  ibuprofen (ADVIL,MOTRIN) 400 MG tablet (600 mg  Given 05/06/16 1610)     Initial Impression / Assessment and Plan / ED Course  I have reviewed the triage vital signs and the nursing notes.  Pertinent labs & imaging results that were available during my care of the patient were reviewed by me and considered in my medical decision making (see chart for details).  Clinical Course     61 yo F With a chief complaint of cough congestion fever. Mildly dehydrated on my exam. Will give IV fluids. Check labs, chest x-ray to evaluate for pneumonia.  CXR negative, labs reassuring, fluids with improvement, suspect viral etiology, continue zpack until complete.  PCP follow up.   5:35 PM:  I have discussed the  diagnosis/risks/treatment options with the patient and family and believe the pt to be eligible for discharge home to follow-up with PCP. We also discussed returning to the ED immediately if new or worsening sx occur. We discussed the sx which are most concerning (e.g., sudden worsening pain, fever, inability to tolerate by mouth) that necessitate immediate return. Medications administered to the patient during their visit and any new prescriptions provided to the patient are listed below.  Medications given during this visit Medications  ibuprofen (ADVIL,MOTRIN) tablet 800 mg (800 mg Oral Not Given 05/06/16 1622)  ibuprofen (ADVIL,MOTRIN) 200 MG tablet (not administered)  sodium chloride 0.9 % bolus 2,000 mL (2,000 mLs Intravenous New Bag/Given 05/06/16 1609)  acetaminophen (TYLENOL) tablet 1,000 mg (1,000 mg Oral Given 05/06/16 1610)  ibuprofen (ADVIL,MOTRIN) 400 MG tablet (600 mg  Given 05/06/16 1610)     The patient appears reasonably screen and/or stabilized for discharge and I doubt any other medical condition or other [facility] requiring further screening, evaluation, or treatment in the ED at this time prior to discharge.    Final Clinical Impressions(s) / ED Diagnoses   Final diagnoses:  Influenza-like illness    New Prescriptions New Prescriptions   BENZONATATE (TESSALON) 100 MG CAPSULE    Take 1 capsule (100 mg total) by mouth every 8 (eight) hours.     Deno Etienne, DO 05/06/16 1735

## 2016-05-06 NOTE — Telephone Encounter (Signed)
Pt call back she said to cancel request & appt w/Dr. Jenny Reichmann. Will be seeing Dr. Charlett Blake sooner @ 3...Kathy King

## 2016-05-07 ENCOUNTER — Encounter: Payer: Self-pay | Admitting: Family Medicine

## 2016-05-07 ENCOUNTER — Ambulatory Visit (INDEPENDENT_AMBULATORY_CARE_PROVIDER_SITE_OTHER): Payer: 59 | Admitting: Family Medicine

## 2016-05-07 VITALS — BP 120/64 | HR 80 | Temp 97.6°F | Resp 16 | Ht 64.0 in | Wt 174.4 lb

## 2016-05-07 DIAGNOSIS — J189 Pneumonia, unspecified organism: Secondary | ICD-10-CM | POA: Diagnosis not present

## 2016-05-07 MED ORDER — CEFTRIAXONE SODIUM 1 G IJ SOLR
1.0000 g | INTRAMUSCULAR | Status: DC
  Administered 2016-05-07: 1 g via INTRAMUSCULAR

## 2016-05-07 NOTE — Assessment & Plan Note (Signed)
Fever broke over night after a shot of Rocephin yesterday and a bag of fluids in the ER. Will repeat 1 GM of Rocephin today. May continue Tussionex qhs prn, Tessalon prn during the day. Tylenol and Ibuprofen prn. Consider warm tea with lemon and honey and elderberry liquid prn for cough and throat symptoms. icnrease fluids due to persistent dehydration

## 2016-05-07 NOTE — Progress Notes (Signed)
Patient ID: Kathy King, female   DOB: 08-29-1955, 61 y.o.   MRN: [id number]   Subjective:    Patient ID: Kathy King, female    DOB: 07-Mar-1956, 61 y.o.   MRN: [id number]  Chief Complaint  Patient presents with  . Follow-up    continues to experience non-productive cough, fatigued, lack fo appetite    HPI Patient is in today for ER follow up. She was seen here yesterday with worsening respiratory symptoms despite Zpak and unremitting hi grade fevers. Was found to be lethargic with significant dehydration. She was given 1 gm of Rocephin in office and sent to er. In ER was given IVF and labs showed no elevation in WBC and CXR was unremarkable. Today she feels much better, cough still present but qhs Tussionex is helping. Fever has broken and she continues to try and stay well hydrated.   Past Medical History:  Diagnosis Date  . Adenomatous colon polyp    tubular  . CHICKENPOX, HX OF   . OSTEOPENIA   . SCIATICA, LEFT 08/09/2009   s/p PT, NSAIDs  . Unspecified vitamin D deficiency     Past Surgical History:  Procedure Laterality Date  . Arthroscopic shoulder  2008  . CERVICAL FUSION  2006  . CESAREAN SECTION  88 & 93   x's 2  . TONSILLECTOMY  1965    Family History  Problem Relation Age of Onset  . Memory loss Mother   . Hypertension Mother   . Hyperlipidemia Mother   . Arthritis Other     grandparent  . Breast cancer Maternal Grandmother   . Seizures Son     Social History   Social History  . Marital status: Married    Spouse name: N/A  . Number of children: N/A  . Years of education: N/A   Occupational History  . Not on file.   Social History Main Topics  . Smoking status: Never Smoker  . Smokeless tobacco: Never Used  . Alcohol use No  . Drug use: No  . Sexual activity: Not on file   Other Topics Concern  . Not on file   Social History Narrative   Married, lives with spouse and kids    Outpatient Medications Prior to Visit  Medication Sig  Dispense Refill  . Albuterol Sulfate 108 (90 Base) MCG/ACT AEPB Inhale 2 puffs into the lungs every 6 (six) hours as needed. 1 each 1  . benzonatate (TESSALON) 100 MG capsule Take 1 capsule (100 mg total) by mouth every 8 (eight) hours. 21 capsule 0  . calcium gluconate 500 MG tablet Take 500 mg by mouth daily.      . chlorpheniramine-HYDROcodone (TUSSIONEX PENNKINETIC ER) 10-8 MG/5ML SUER Take 5 mLs by mouth at bedtime as needed for cough. 140 mL 0  . Cholecalciferol (VITAMIN D) 2000 UNITS CAPS Take by mouth.      . Melatonin 3 MG TABS Take 2 tablets by mouth daily.    . meloxicam (MOBIC) 15 MG tablet Take 1 tablet (15 mg total) by mouth daily. 90 tablet 1  . SUMAtriptan (IMITREX) 50 MG tablet Take 1 tablet (50 mg total) by mouth every 2 (two) hours as needed for migraine. May repeat in 2 hours if headache persists or recurs. 10 tablet 0  . azithromycin (ZITHROMAX) 250 MG tablet Take 2 tabs the first day and then 1 tab daily until you run out. 6 tablet 0   No facility-administered medications prior to visit.  Allergies  Allergen Reactions  . Sulfonamide Derivatives     REACTION: unspecified; as child    Review of Systems  Constitutional: Positive for malaise/fatigue. Negative for chills and fever.  HENT: Positive for congestion and sore throat. Negative for ear discharge.   Respiratory: Positive for cough and sputum production. Negative for shortness of breath and wheezing.   Cardiovascular: Negative for chest pain.  Gastrointestinal: Negative for nausea and vomiting.  Genitourinary: Negative for dysuria.  Musculoskeletal: Positive for back pain and myalgias.  Skin: Negative for rash.  Neurological: Positive for headaches.       Objective:    Physical Exam  Constitutional: She is oriented to person, place, and time. She appears well-developed and well-nourished. No distress.  HENT:  Head: Normocephalic and atraumatic.  Nose: Nose normal.  Dry mucus membranes  Eyes: Right  eye exhibits no discharge. Left eye exhibits no discharge.  Neck: Normal range of motion. Neck supple.  Cardiovascular: Normal rate and regular rhythm.   No murmur heard. Pulmonary/Chest: Effort normal and breath sounds normal.  Abdominal: Soft. Bowel sounds are normal. There is no tenderness.  Musculoskeletal: She exhibits no edema.  Neurological: She is alert and oriented to person, place, and time.  Skin: Skin is warm and dry.  Psychiatric: She has a normal mood and affect.  Nursing note and vitals reviewed.   BP 120/64 (BP Location: Left Arm, Patient Position: Sitting, Cuff Size: Normal)   Pulse 80   Temp 97.6 F (36.4 C) (Oral)   Resp 16   Ht 5\' 4"  (1.626 m)   Wt 174 lb 6.4 oz (79.1 kg)   SpO2 97%   BMI 29.94 kg/m  Wt Readings from Last 3 Encounters:  05/07/16 174 lb 6.4 oz (79.1 kg)  05/06/16 167 lb (75.8 kg)  05/06/16 167 lb (75.8 kg)     Lab Results  Component Value Date   WBC 7.9 05/06/2016   HGB 12.1 05/06/2016   HCT 37.4 05/06/2016   PLT 141 (L) 05/06/2016   GLUCOSE 119 (H) 05/06/2016   CHOL 180 12/18/2015   TRIG 39.0 12/18/2015   HDL 83.80 12/18/2015   LDLCALC 88 12/18/2015   ALT 27 05/06/2016   AST 27 05/06/2016   NA 132 (L) 05/06/2016   K 3.6 05/06/2016   CL 99 (L) 05/06/2016   CREATININE 0.79 05/06/2016   BUN 12 05/06/2016   CO2 23 05/06/2016   TSH 2.94 12/17/2015   HGBA1C 5.3 12/17/2015    Lab Results  Component Value Date   TSH 2.94 12/17/2015   Lab Results  Component Value Date   WBC 7.9 05/06/2016   HGB 12.1 05/06/2016   HCT 37.4 05/06/2016   MCV 90.8 05/06/2016   PLT 141 (L) 05/06/2016   Lab Results  Component Value Date   NA 132 (L) 05/06/2016   K 3.6 05/06/2016   CO2 23 05/06/2016   GLUCOSE 119 (H) 05/06/2016   BUN 12 05/06/2016   CREATININE 0.79 05/06/2016   BILITOT 0.6 05/06/2016   ALKPHOS 66 05/06/2016   AST 27 05/06/2016   ALT 27 05/06/2016   PROT 6.4 (L) 05/06/2016   ALBUMIN 4.2 05/06/2016   CALCIUM 8.5 (L)  05/06/2016   ANIONGAP 10 05/06/2016   GFR 72.12 12/17/2015   Lab Results  Component Value Date   CHOL 180 12/18/2015   Lab Results  Component Value Date   HDL 83.80 12/18/2015   Lab Results  Component Value Date   LDLCALC 88 12/18/2015  Lab Results  Component Value Date   TRIG 39.0 12/18/2015   Lab Results  Component Value Date   CHOLHDL 2 12/18/2015   Lab Results  Component Value Date   HGBA1C 5.3 12/17/2015       Assessment & Plan:   Problem List Items Addressed This Visit    Pneumonia due to organism    Fever broke over night after a shot of Rocephin yesterday and a bag of fluids in the ER. Will repeat 1 GM of Rocephin today. May continue Tussionex qhs prn, Tessalon prn during the day. Tylenol and Ibuprofen prn. Consider warm tea with lemon and honey and elderberry liquid prn for cough and throat symptoms. icnrease fluids due to persistent dehydration      Relevant Medications   cefTRIAXone (ROCEPHIN) injection 1 g    Other Visit Diagnoses    Pneumonia due to infectious organism, unspecified laterality, unspecified part of lung    -  Primary   Relevant Medications   cefTRIAXone (ROCEPHIN) injection 1 g      I have discontinued Ms. Amara's azithromycin. I am also having her maintain her calcium gluconate, Vitamin D, SUMAtriptan, Melatonin, meloxicam, Albuterol Sulfate, chlorpheniramine-HYDROcodone, and benzonatate. We administered cefTRIAXone. We will continue to administer cefTRIAXone.  Meds ordered this encounter  Medications  . cefTRIAXone (ROCEPHIN) injection 1 g    Order Specific Question:   Antibiotic Indication:    Answer:   CAP    Penni Homans, MD

## 2016-05-07 NOTE — Progress Notes (Signed)
Pre visit review using our clinic review tool, if applicable. No additional management support is needed unless otherwise documented below in the visit note. 

## 2016-05-07 NOTE — Patient Instructions (Signed)
Elderberry liquid Tea with lemon and honey Tylenol/Acetarminophen/APAP ES 500mg  2 up to tid as needed to a max of 3000 mg in 24 hours Advil/Ibuprofen 200 mg tabs 2 tabs po q 4-6 hours prn pain with food up to max of 2400 mg in 24 hours Community-Acquired Pneumonia, Adult Introduction  Pneumonia is an infection of the lungs. One type of pneumonia can happen while a person is in a hospital. A different type can happen when a person is not in a hospital (community-acquired pneumonia). It is easy for this kind to spread from person to person. It can spread to you if you breathe near an infected person who coughs or sneezes. Some symptoms include:  A dry cough.  A wet (productive) cough.  Fever.  Sweating.  Chest pain. Follow these instructions at home:  Take over-the-counter and prescription medicines only as told by your doctor.  Only take cough medicine if you are losing sleep.  If you were prescribed an antibiotic medicine, take it as told by your doctor. Do not stop taking the antibiotic even if you start to feel better.  Sleep with your head and neck raised (elevated). You can do this by putting a few pillows under your head, or you can sleep in a recliner.  Do not use tobacco products. These include cigarettes, chewing tobacco, and e-cigarettes. If you need help quitting, ask your doctor.  Drink enough water to keep your pee (urine) clear or pale yellow. A shot (vaccine) can help prevent pneumonia. Shots are often suggested for:  People older than 59 years of age.  People older than 59 years of age:  Who are having cancer treatment.  Who have long-term (chronic) lung disease.  Who have problems with their body's defense system (immune system). You may also prevent pneumonia if you take these actions:  Get the flu (influenza) shot every year.  Go to the dentist as often as told.  Wash your hands often. If soap and water are not available, use hand sanitizer. Contact  a doctor if:  You have a fever.  You lose sleep because your cough medicine does not help. Get help right away if:  You are short of breath and it gets worse.  You have more chest pain.  Your sickness gets worse. This is very serious if:  You are an older adult.  Your body's defense system is weak.  You cough up blood. This information is not intended to replace advice given to you by your health care provider. Make sure you discuss any questions you have with your health care provider. Document Released: 10/01/2007 Document Revised: 09/20/2015 Document Reviewed: 08/09/2014  2017 Elsevier

## 2016-05-16 ENCOUNTER — Other Ambulatory Visit: Payer: Self-pay | Admitting: Family Medicine

## 2016-05-16 MED ORDER — BENZONATATE 100 MG PO CAPS: 100.0000 mg | ORAL_CAPSULE | Freq: Three times a day (TID) | ORAL | 1 refills | Status: DC | PRN

## 2016-05-16 MED FILL — BENZONATATE 100 MG CAP: 100 | 10 days supply | Qty: 30 | Fill #0

## 2016-05-16 NOTE — Progress Notes (Unsigned)
Refill of Tessalon Perles ordered per patient request.

## 2016-05-29 ENCOUNTER — Other Ambulatory Visit: Payer: Self-pay | Admitting: Family Medicine

## 2016-05-29 MED ORDER — FLUTICASONE PROPIONATE 50 MCG/ACT NA SUSP: 2.0000 | Freq: Every day | NASAL | 6 refills | Status: DC

## 2016-05-29 MED FILL — FLUTICASONE PROP 50 MCG SPR: 50 | 30 days supply | Qty: 16 | Fill #0

## 2016-05-29 NOTE — Progress Notes (Unsigned)
Flonase reordered per patient request.

## 2016-11-03 ENCOUNTER — Telehealth: Payer: Self-pay | Admitting: Family Medicine

## 2016-11-03 NOTE — Telephone Encounter (Signed)
Advised pt verbally that 15 mg is the max daily recommended dose of mobic.  She will stop taking extra

## 2016-11-03 NOTE — Telephone Encounter (Signed)
-----   Message from Emi Holes, [place] sent at 11/03/2016  9:52 AM EDT ----- Spoke to pt who states she had to take two Mobic (meloxicam) 15mg  tablets twice over the weekend due to lower back, leg and foot pain. Pt would like to know if this is safe. Please advise.

## 2016-11-07 ENCOUNTER — Other Ambulatory Visit: Payer: Self-pay | Admitting: Family Medicine

## 2016-11-07 DIAGNOSIS — M5442 Lumbago with sciatica, left side: Secondary | ICD-10-CM

## 2016-11-07 NOTE — Telephone Encounter (Signed)
Received refill request for meloxicam (MOBIC) 15 MG tablet. Last office visit 01/24/16 and last refill 11/19/15. Is it ok to refill? Please advise.

## 2016-11-10 MED FILL — MELOXICAM 15 MG TABLET: 15 | 90 days supply | Qty: 90 | Fill #0

## 2016-11-30 NOTE — Progress Notes (Signed)
error 

## 2016-12-03 ENCOUNTER — Other Ambulatory Visit (HOSPITAL_COMMUNITY)
Admission: RE | Admit: 2016-12-03 | Discharge: 2016-12-03 | Disposition: A | Discharge status: Home / Self Care | Payer: 59 | Source: Ambulatory Visit | Attending: Family Medicine | Admitting: Family Medicine

## 2016-12-03 ENCOUNTER — Ambulatory Visit (INDEPENDENT_AMBULATORY_CARE_PROVIDER_SITE_OTHER): Payer: 59 | Admitting: Family Medicine

## 2016-12-03 ENCOUNTER — Encounter: Payer: Self-pay | Admitting: Family Medicine

## 2016-12-03 VITALS — BP 110/72 | HR 86 | Temp 98.1°F | Ht 64.0 in | Wt 170.2 lb

## 2016-12-03 DIAGNOSIS — Z0000 Encounter for general adult medical examination without abnormal findings: Secondary | ICD-10-CM

## 2016-12-03 DIAGNOSIS — Z124 Encounter for screening for malignant neoplasm of cervix: Secondary | ICD-10-CM

## 2016-12-03 DIAGNOSIS — Z131 Encounter for screening for diabetes mellitus: Secondary | ICD-10-CM | POA: Diagnosis not present

## 2016-12-03 DIAGNOSIS — M545 Low back pain, unspecified: Secondary | ICD-10-CM

## 2016-12-03 DIAGNOSIS — Z130 Encounter for screening for diseases of the blood and blood-forming organs and certain disorders involving the immune mechanism: Secondary | ICD-10-CM

## 2016-12-03 DIAGNOSIS — Z13220 Encounter for screening for lipoid disorders: Secondary | ICD-10-CM | POA: Diagnosis not present

## 2016-12-03 DIAGNOSIS — E2839 Other primary ovarian failure: Secondary | ICD-10-CM

## 2016-12-03 DIAGNOSIS — G8929 Other chronic pain: Secondary | ICD-10-CM

## 2016-12-03 NOTE — Patient Instructions (Signed)
It was great to see you today!  I will be in touch with your labs asap I ordered a bone density for you; you can schedule this at the same time as you mammogram  I will set you up to see Dr. Barbaraann Barthel about your back- I suspect that you may have SI joint pain.    Health Maintenance, Female Adopting a healthy lifestyle and getting preventive care can go a long way to promote health and wellness. Talk with your health care provider about what schedule of regular examinations is right for you. This is a good chance for you to check in with your provider about disease prevention and staying healthy. In between checkups, there are plenty of things you can do on your own. Experts have done a lot of research about which lifestyle changes and preventive measures are most likely to keep you healthy. Ask your health care provider for more information. Weight and diet Eat a healthy diet  Be sure to include plenty of vegetables, fruits, low-fat dairy products, and lean protein.  Do not eat a lot of foods high in solid fats, added sugars, or salt.  Get regular exercise. This is one of the most important things you can do for your health. ? Most adults should exercise for at least 150 minutes each week. The exercise should increase your heart rate and make you sweat (moderate-intensity exercise). ? Most adults should also do strengthening exercises at least twice a week. This is in addition to the moderate-intensity exercise.  Maintain a healthy weight  Body mass index (BMI) is a measurement that can be used to identify possible weight problems. It estimates body fat based on height and weight. Your health care provider can help determine your BMI and help you achieve or maintain a healthy weight.  For females 3 years of age and older: ? A BMI below 18.5 is considered underweight. ? A BMI of 18.5 to 24.9 is normal. ? A BMI of 25 to 29.9 is considered overweight. ? A BMI of 30 and above is considered  obese.  Watch levels of cholesterol and blood lipids  You should start having your blood tested for lipids and cholesterol at 59 years of age, then have this test every 5 years.  You may need to have your cholesterol levels checked more often if: ? Your lipid or cholesterol levels are high. ? You are older than 59 years of age. ? You are at high risk for heart disease.  Cancer screening Lung Cancer  Lung cancer screening is recommended for adults 73-86 years old who are at high risk for lung cancer because of a history of smoking.  A yearly low-dose CT scan of the lungs is recommended for people who: ? Currently smoke. ? Have quit within the past 15 years. ? Have at least a 30-pack-year history of smoking. A pack year is smoking an average of one pack of cigarettes a day for 1 year.  Yearly screening should continue until it has been 15 years since you quit.  Yearly screening should stop if you develop a health problem that would prevent you from having lung cancer treatment.  Breast Cancer  Practice breast self-awareness. This means understanding how your breasts normally appear and feel.  It also means doing regular breast self-exams. Let your health care provider know about any changes, no matter how small.  If you are in your 20s or 30s, you should have a clinical breast exam (CBE) by a  health care provider every 1-3 years as part of a regular health exam.  If you are 13 or older, have a CBE every year. Also consider having a breast X-ray (mammogram) every year.  If you have a family history of breast cancer, talk to your health care provider about genetic screening.  If you are at high risk for breast cancer, talk to your health care provider about having an MRI and a mammogram every year.  Breast cancer gene (BRCA) assessment is recommended for women who have family members with BRCA-related cancers. BRCA-related cancers  include: ? Breast. ? Ovarian. ? Tubal. ? Peritoneal cancers.  Results of the assessment will determine the need for genetic counseling and BRCA1 and BRCA2 testing.  Cervical Cancer Your health care provider may recommend that you be screened regularly for cancer of the pelvic organs (ovaries, uterus, and vagina). This screening involves a pelvic examination, including checking for microscopic changes to the surface of your cervix (Pap test). You may be encouraged to have this screening done every 3 years, beginning at age 39.  For women ages 21-65, health care providers may recommend pelvic exams and Pap testing every 3 years, or they may recommend the Pap and pelvic exam, combined with testing for human papilloma virus (HPV), every 5 years. Some types of HPV increase your risk of cervical cancer. Testing for HPV may also be done on women of any age with unclear Pap test results.  Other health care providers may not recommend any screening for nonpregnant women who are considered low risk for pelvic cancer and who do not have symptoms. Ask your health care provider if a screening pelvic exam is right for you.  If you have had past treatment for cervical cancer or a condition that could lead to cancer, you need Pap tests and screening for cancer for at least 20 years after your treatment. If Pap tests have been discontinued, your risk factors (such as having a new sexual partner) need to be reassessed to determine if screening should resume. Some women have medical problems that increase the chance of getting cervical cancer. In these cases, your health care provider may recommend more frequent screening and Pap tests.  Colorectal Cancer  This type of cancer can be detected and often prevented.  Routine colorectal cancer screening usually begins at 59 years of age and continues through 59 years of age.  Your health care provider may recommend screening at an earlier age if you have risk factors  for colon cancer.  Your health care provider may also recommend using home test kits to check for hidden blood in the stool.  A small camera at the end of a tube can be used to examine your colon directly (sigmoidoscopy or colonoscopy). This is done to check for the earliest forms of colorectal cancer.  Routine screening usually begins at age 58.  Direct examination of the colon should be repeated every 5-10 years through 59 years of age. However, you may need to be screened more often if early forms of precancerous polyps or small growths are found.  Skin Cancer  Check your skin from head to toe regularly.  Tell your health care provider about any new moles or changes in moles, especially if there is a change in a mole's shape or color.  Also tell your health care provider if you have a mole that is larger than the size of a pencil eraser.  Always use sunscreen. Apply sunscreen liberally and repeatedly throughout  Protect yourself by wearing long sleeves, pants, a wide-brimmed hat, and sunglasses whenever you are outside.  Heart disease, diabetes, and high blood pressure  High blood pressure causes heart disease and increases the risk of stroke. High blood pressure is more likely to develop in: ? People who have blood pressure in the high end of the normal range (130-139/85-89 mm Hg). ? People who are overweight or obese. ? People who are African American.  If you are 18-39 years of age, have your blood pressure checked every 3-5 years. If you are 40 years of age or older, have your blood pressure checked every year. You should have your blood pressure measured twice-once when you are at a hospital or clinic, and once when you are not at a hospital or clinic. Record the average of the two measurements. To check your blood pressure when you are not at a hospital or clinic, you can use: ? An automated blood pressure machine at a pharmacy. ? A home blood pressure monitor.  If  you are between 55 years and 79 years old, ask your health care provider if you should take aspirin to prevent strokes.  Have regular diabetes screenings. This involves taking a blood sample to check your fasting blood sugar level. ? If you are at a normal weight and have a low risk for diabetes, have this test once every three years after 59 years of age. ? If you are overweight and have a high risk for diabetes, consider being tested at a younger age or more often. Preventing infection Hepatitis B  If you have a higher risk for hepatitis B, you should be screened for this virus. You are considered at high risk for hepatitis B if: ? You were born in a country where hepatitis B is common. Ask your health care provider which countries are considered high risk. ? Your parents were born in a high-risk country, and you have not been immunized against hepatitis B (hepatitis B vaccine). ? You have HIV or AIDS. ? You use needles to inject street drugs. ? You live with someone who has hepatitis B. ? You have had sex with someone who has hepatitis B. ? You get hemodialysis treatment. ? You take certain medicines for conditions, including cancer, organ transplantation, and autoimmune conditions.  Hepatitis C  Blood testing is recommended for: ? Everyone born from 1945 through 1965. ? Anyone with known risk factors for hepatitis C.  Sexually transmitted infections (STIs)  You should be screened for sexually transmitted infections (STIs) including gonorrhea and chlamydia if: ? You are sexually active and are younger than 59 years of age. ? You are older than 59 years of age and your health care provider tells you that you are at risk for this type of infection. ? Your sexual activity has changed since you were last screened and you are at an increased risk for chlamydia or gonorrhea. Ask your health care provider if you are at risk.  If you do not have HIV, but are at risk, it may be recommended  that you take a prescription medicine daily to prevent HIV infection. This is called pre-exposure prophylaxis (PrEP). You are considered at risk if: ? You are sexually active and do not regularly use condoms or know the HIV status of your partner(s). ? You take drugs by injection. ? You are sexually active with a partner who has HIV.  Talk with your health care provider about whether you are at high risk of   high risk of being infected with HIV. If you choose to begin PrEP, you should first be tested for HIV. You should then be tested every 3 months for as long as you are taking PrEP. Pregnancy  If you are premenopausal and you may become pregnant, ask your health care provider about preconception counseling.  If you may become pregnant, take 400 to 800 micrograms (mcg) of folic acid every day.  If you want to prevent pregnancy, talk to your health care provider about birth control (contraception). Osteoporosis and menopause  Osteoporosis is a disease in which the bones lose minerals and strength with aging. This can result in serious bone fractures. Your risk for osteoporosis can be identified using a bone density scan.  If you are 14 years of age or older, or if you are at risk for osteoporosis and fractures, ask your health care provider if you should be screened.  Ask your health care provider whether you should take a calcium or vitamin D supplement to lower your risk for osteoporosis.  Menopause may have certain physical symptoms and risks.  Hormone replacement therapy may reduce some of these symptoms and risks. Talk to your health care provider about whether hormone replacement therapy is right for you. Follow these instructions at home:  Schedule regular health, dental, and eye exams.  Stay current with your immunizations.  Do not use any tobacco products including cigarettes, chewing tobacco, or electronic cigarettes.  If you are pregnant, do not drink alcohol.  If you are  breastfeeding, limit how much and how often you drink alcohol.  Limit alcohol intake to no more than 1 drink per day for nonpregnant women. One drink equals 12 ounces of beer, 5 ounces of wine, or 1 ounces of hard liquor.  Do not use street drugs.  Do not share needles.  Ask your health care provider for help if you need support or information about quitting drugs.  Tell your health care provider if you often feel depressed.  Tell your health care provider if you have ever been abused or do not feel safe at home. This information is not intended to replace advice given to you by your health care provider. Make sure you discuss any questions you have with your health care provider. Document Released: 10/28/2010 Document Revised: 09/20/2015 Document Reviewed: 01/16/2015 Elsevier Interactive Patient Education  [employer].

## 2016-12-03 NOTE — Progress Notes (Signed)
[facility] [place], [place], [place] [address] [phone]  Date:  12/03/2016   Name:  Kathy King   DOB:  [birth year]   MRN:  [id number]  PCP:  Darreld Mclean, MD    Chief Complaint: Annual Exam (Pt here for CPE with PAP. )   History of Present Illness:  Kathy King is a 61 y.o. very pleasant female patient who presents with the following:  Here today seeking a CPE and pap Last labs in January of this year, last cholesterol about a year ago Pap: 2016, never had an abnl Colonoscopy, mammo are UTD Possible history of vitamin D def- however her most recent vitamin D was normal.  She is on vitamin D OTC   She has had lower back pain for several years- it has gotten worse again over the last several months Worse if she stays in one position for too long Her legs will feel achy and tired. However she does not think she is having nerve pain down her legs She will have a   She will take ibuprofen as needed, and heat like a hot bath helps   She had negative lumbar films in 2011 She will have pain more if she bends down too much- like when working in the garden No fever or chills No weight loss  Menopause about 3 years ago- no further bleeding She may have hot flashes She does try to exercise by walking mostly She is a non smoker  Wt Readings from Last 3 Encounters:  12/03/16 170 lb 3.2 oz (77.2 kg)  05/07/16 174 lb 6.4 oz (79.1 kg)  05/06/16 167 lb (75.8 kg)    Patient Active Problem List   Diagnosis Date Noted  . Pneumonia due to organism 05/06/2016  . Viral URI with cough 01/30/2015  . Vitamin D deficiency   . Cough variant asthma   . Trochanteric bursitis of left hip 10/22/2011  . Lumbar radiculopathy 10/22/2011  . SCIATICA, LEFT 08/09/2009  . OSTEOPENIA 08/09/2009    Past Medical History:  Diagnosis Date  . Adenomatous colon polyp    tubular  . CHICKENPOX, HX OF   . OSTEOPENIA   .  SCIATICA, LEFT 08/09/2009   s/p PT, NSAIDs  . Unspecified vitamin D deficiency     Past Surgical History:  Procedure Laterality Date  . Arthroscopic shoulder  2008  . CERVICAL FUSION  2006  . CESAREAN SECTION  88 & 93   x's 2  . TONSILLECTOMY  1965    Social History  Substance Use Topics  . Smoking status: Never Smoker  . Smokeless tobacco: Never Used  . Alcohol use No    Family History  Problem Relation Age of Onset  . Memory loss Mother   . Hypertension Mother   . Hyperlipidemia Mother   . Arthritis Other        grandparent  . Breast cancer Maternal Grandmother   . Seizures Son     Allergies  Allergen Reactions  . Sulfonamide Derivatives     REACTION: unspecified; as child    Medication list has been reviewed and updated.  Current Outpatient Prescriptions on File Prior to Visit  Medication Sig Dispense Refill  . calcium gluconate 500 MG tablet Take 500 mg by mouth daily.      . Cholecalciferol (VITAMIN D) 2000 UNITS CAPS Take by mouth.      . meloxicam (MOBIC) 15  MG tablet TAKE 1 TABLET (15 MG TOTAL) BY MOUTH DAILY. 90 tablet 1   No current facility-administered medications on file prior to visit.     Review of Systems:  As per HPI- otherwise negative.   Physical Examination: Vitals:   12/03/16 1350  BP: 110/72  Pulse: 86  Temp: 98.1 F (36.7 C)   Vitals:   12/03/16 1350  Weight: 170 lb 3.2 oz (77.2 kg)  Height: 5\' 4"  (1.626 m)   Body mass index is 29.21 kg/m. Ideal Body Weight: Weight in (lb) to have BMI = 25: 145.3  GEN: WDWN, NAD, Non-toxic, A & O x 3, looks well, overweight HEENT: Atraumatic, Normocephalic. Neck supple. No masses, No LAD.  Bilateral TM wnl, oropharynx normal.  PEERL,EOMI.   Ears and Nose: No external deformity. CV: RRR, No M/G/R. No JVD. No thrill. No extra heart sounds. PULM: CTA B, no wheezes, crackles, rhonchi. No retractions. No resp. distress. No accessory muscle use. ABD: S, NT, ND, +BS. No rebound. No HSM. EXTR:  No c/c/e NEURO Normal gait.  PSYCH: Normally interactive. Conversant. Not depressed or anxious appearing.  Calm demeanor.  Breast: normal exam, no masses/ dimpling/ discharge Pelvic: normal, no vaginal lesions or discharge. Uterus normal, no CMT, no adnexal tendereness or masses She does have tenderness over her bilateral SI joints Normal BLE strength, sensation and DTR   Assessment and Plan: Physical exam  Screening for diabetes mellitus - Plan: Comprehensive metabolic panel  Screening for deficiency anemia - Plan: CBC  Screening for hyperlipidemia - Plan: Lipid panel  Screening for cervical cancer - Plan: Cytology - PAP  Estrogen deficiency - Plan: DG Bone Density  Here today for a CPE Labs pending as above Pap today Referral for dexa and mammo Referral to sports med Will plan further follow- up pending labs.   Signed Lamar Blinks, MD

## 2016-12-04 LAB — COMPREHENSIVE METABOLIC PANEL
ALT: 23 U/L (ref 0–35)
AST: 23 U/L (ref 0–37)
Albumin: 4.6 g/dL (ref 3.5–5.2)
Alkaline Phosphatase: 72 U/L (ref 39–117)
BUN: 18 mg/dL (ref 6–23)
CO2: 30 mEq/L (ref 19–32)
Calcium: 9.4 mg/dL (ref 8.4–10.5)
Chloride: 104 mEq/L (ref 96–112)
Creatinine, Ser: 0.9 mg/dL (ref 0.40–1.20)
GFR: 68.2 mL/min (ref >60.00)
Glucose, Bld: 85 mg/dL (ref 70–99)
Potassium: 4.1 mEq/L (ref 3.5–5.1)
Sodium: 139 mEq/L (ref 135–145)
Total Bilirubin: 0.4 mg/dL (ref 0.2–1.2)
Total Protein: 6.5 g/dL (ref 6.0–8.3)

## 2016-12-04 LAB — LIPID PANEL
Cholesterol: 200 mg/dL (ref 0–200)
HDL: 78.5 mg/dL (ref >39.00)
LDL Cholesterol: 99 mg/dL (ref 0–99)
NonHDL: 121.75
Total CHOL/HDL Ratio: 3
Triglycerides: 116 mg/dL (ref 0.0–149.0)
VLDL: 23.2 mg/dL (ref 0.0–40.0)

## 2016-12-04 LAB — CBC
HCT: 41.3 % (ref 36.0–46.0)
Hemoglobin: 13.5 g/dL (ref 12.0–15.0)
MCHC: 32.8 g/dL (ref 30.0–36.0)
MCV: 91.2 fl (ref 78.0–100.0)
Platelets: 210 10*3/uL (ref 150.0–400.0)
RBC: 4.53 Mil/uL (ref 3.87–5.11)
RDW: 13.2 % (ref 11.5–15.5)
WBC: 5.9 10*3/uL (ref 4.0–10.5)

## 2016-12-05 ENCOUNTER — Other Ambulatory Visit: Payer: Self-pay | Admitting: Family Medicine

## 2016-12-05 DIAGNOSIS — Z1231 Encounter for screening mammogram for malignant neoplasm of breast: Secondary | ICD-10-CM

## 2016-12-08 LAB — CYTOLOGY - PAP
Diagnosis: NEGATIVE
HPV: NOT DETECTED

## 2016-12-09 ENCOUNTER — Ambulatory Visit: Payer: 59 | Admitting: Family Medicine

## 2016-12-10 ENCOUNTER — Ambulatory Visit (INDEPENDENT_AMBULATORY_CARE_PROVIDER_SITE_OTHER): Payer: 59 | Admitting: Family Medicine

## 2016-12-10 ENCOUNTER — Encounter: Payer: Self-pay | Admitting: Family Medicine

## 2016-12-10 DIAGNOSIS — M5441 Lumbago with sciatica, right side: Secondary | ICD-10-CM

## 2016-12-10 DIAGNOSIS — M5442 Lumbago with sciatica, left side: Secondary | ICD-10-CM

## 2016-12-10 DIAGNOSIS — G8929 Other chronic pain: Secondary | ICD-10-CM

## 2016-12-10 MED ORDER — DICLOFENAC SODIUM 75 MG PO TBEC: 75.0000 mg | DELAYED_RELEASE_TABLET | Freq: Two times a day (BID) | ORAL | 1 refills | Status: DC

## 2016-12-10 MED FILL — DICLOFENAC SODIUM 75 MG TAB: 75 | 30 days supply | Qty: 60 | Fill #0

## 2016-12-10 NOTE — Patient Instructions (Signed)
You have SI joint dysfunction and hip external rotator strain/weakness. Pick 2-3 stretches where you feel the pull in the area of pain - do 3 of these and hold for 20-30 seconds twice a day. Standing hip rotations, hip side raises 3 sets of 10 once a day. Add ankle weight if these become too easy. Diclofenac 75mg  twice a day with food for pain and inflammation. Consider physical therapy - call me if you're not improving and want to go ahead with this. SI joint injection, chiropractic manipulation are two other considerations. Follow up with me in 6 weeks otherwise.

## 2016-12-12 DIAGNOSIS — M545 Low back pain, unspecified: Secondary | ICD-10-CM | POA: Insufficient documentation

## 2016-12-12 NOTE — Assessment & Plan Note (Signed)
consistent with combination of SI joint dysfunction and hip external rotator strain/weakness.  Shown home exercises and stretches to do daily.  Diclofenac twice a day, heat.  Offered physical therapy, SI joint injections - she would like to try diclofenac and home exercise program first.  Also discussed chiropractic manipulation for SI dysfunction.  F/u in 6 weeks.

## 2016-12-12 NOTE — Progress Notes (Signed)
PCP: Copland, Gay Filler, MD  Subjective:   HPI: Patient is a 61 y.o. female here for low back pain.  Patient reports she's had several years of problems with low back. Also known history of trochanteric bursitis and had injections, physical therapy for that. Current pain been worsening over past 6 months. Describes as constant dull ache in low back left worse than right. Bothers with prolonged sitting, bending forward. Has to constantly change positions. Did have some radiation into left leg posteriorly last week and gets pressure sensation into legs at times. Hot baths, ibuprofen help. No bowel/bladder dysfunction. No numbness or tingling.  Past Medical History:  Diagnosis Date  . Adenomatous colon polyp    tubular  . CHICKENPOX, HX OF   . OSTEOPENIA   . SCIATICA, LEFT 08/09/2009   s/p PT, NSAIDs  . Unspecified vitamin D deficiency     Current Outpatient Prescriptions on File Prior to Visit  Medication Sig Dispense Refill  . calcium gluconate 500 MG tablet Take 500 mg by mouth daily.      . Cholecalciferol (VITAMIN D) 2000 UNITS CAPS Take by mouth.       No current facility-administered medications on file prior to visit.     Past Surgical History:  Procedure Laterality Date  . Arthroscopic shoulder  2008  . CERVICAL FUSION  2006  . CESAREAN SECTION  88 & 93   x's 2  . TONSILLECTOMY  1965    Allergies  Allergen Reactions  . Sulfonamide Derivatives     REACTION: unspecified; as child    Social History   Social History  . Marital status: Married    Spouse name: N/A  . Number of children: N/A  . Years of education: N/A   Occupational History  . Not on file.   Social History Main Topics  . Smoking status: Never Smoker  . Smokeless tobacco: Never Used  . Alcohol use No  . Drug use: No  . Sexual activity: Not on file   Other Topics Concern  . Not on file   Social History Narrative   Married, lives with spouse and kids    Family History  Problem  Relation Age of Onset  . Memory loss Mother   . Hypertension Mother   . Hyperlipidemia Mother   . Arthritis Other        grandparent  . Breast cancer Maternal Grandmother   . Seizures Son     BP 123/85   Pulse 80   Ht 5\' 4"  (1.626 m)   Wt 171 lb 3.2 oz (77.7 kg)   BMI 29.39 kg/m   Review of Systems: See HPI above.     Objective:  Physical Exam:  Gen: NAD, comfortable in exam room  Back: No gross deformity, scoliosis. TTP L > R SI joints, over piriformis and external rotators. Less tenderness over proximal IT bands.  No midline or bony TTP. FROM. Strength LEs 5-/5 hip abduction, external rotation.  5/5 all other muscle groups.   2+ MSRs in patellar and achilles tendons, equal bilaterally. Negative SLRs. Sensation intact to light touch bilaterally. Negative logroll bilateral hips Decreased SI joint motion with mild pain bilaterally.  Also pain with bilateral piriformis stretches.   Assessment & Plan:  1. Low back pain - consistent with combination of SI joint dysfunction and hip external rotator strain/weakness.  Shown home exercises and stretches to do daily.  Diclofenac twice a day, heat.  Offered physical therapy, SI joint injections - she would  like to try diclofenac and home exercise program first.  Also discussed chiropractic manipulation for SI dysfunction.  F/u in 6 weeks.

## 2016-12-23 DIAGNOSIS — H5203 Hypermetropia, bilateral: Secondary | ICD-10-CM | POA: Diagnosis not present

## 2017-01-27 ENCOUNTER — Ambulatory Visit
Admission: RE | Admit: 2017-01-27 | Discharge: 2017-01-27 | Disposition: A | Discharge status: Home / Self Care | Payer: 59 | Source: Ambulatory Visit | Attending: Family Medicine | Admitting: Family Medicine

## 2017-01-27 ENCOUNTER — Encounter: Payer: Self-pay | Admitting: Family Medicine

## 2017-01-27 DIAGNOSIS — E2839 Other primary ovarian failure: Secondary | ICD-10-CM

## 2017-01-27 DIAGNOSIS — Z1231 Encounter for screening mammogram for malignant neoplasm of breast: Secondary | ICD-10-CM | POA: Diagnosis not present

## 2017-01-27 DIAGNOSIS — Z780 Asymptomatic menopausal state: Secondary | ICD-10-CM | POA: Diagnosis not present

## 2017-01-27 DIAGNOSIS — M85851 Other specified disorders of bone density and structure, right thigh: Secondary | ICD-10-CM | POA: Diagnosis not present

## 2017-02-12 ENCOUNTER — Encounter: Payer: Self-pay | Admitting: Family Medicine

## 2017-02-13 ENCOUNTER — Other Ambulatory Visit: Payer: Self-pay | Admitting: Family Medicine

## 2017-02-13 DIAGNOSIS — Z23 Encounter for immunization: Secondary | ICD-10-CM

## 2017-03-11 ENCOUNTER — Encounter: Payer: Self-pay | Admitting: Family Medicine

## 2017-03-11 ENCOUNTER — Ambulatory Visit (HOSPITAL_BASED_OUTPATIENT_CLINIC_OR_DEPARTMENT_OTHER)
Admission: RE | Admit: 2017-03-11 | Discharge: 2017-03-11 | Disposition: A | Discharge status: Home / Self Care | Payer: 59 | Source: Ambulatory Visit | Attending: Family Medicine | Admitting: Family Medicine

## 2017-03-11 ENCOUNTER — Ambulatory Visit (INDEPENDENT_AMBULATORY_CARE_PROVIDER_SITE_OTHER): Payer: 59 | Admitting: Family Medicine

## 2017-03-11 VITALS — BP 120/72 | HR 97 | Temp 98.6°F | Resp 16 | Ht 64.0 in | Wt 175.0 lb

## 2017-03-11 DIAGNOSIS — M545 Low back pain, unspecified: Secondary | ICD-10-CM

## 2017-03-11 DIAGNOSIS — G8929 Other chronic pain: Secondary | ICD-10-CM | POA: Diagnosis not present

## 2017-03-11 DIAGNOSIS — M47816 Spondylosis without myelopathy or radiculopathy, lumbar region: Secondary | ICD-10-CM | POA: Diagnosis not present

## 2017-03-11 MED ORDER — CYCLOBENZAPRINE HCL 10 MG PO TABS: 10.0000 mg | ORAL_TABLET | Freq: Two times a day (BID) | ORAL | 0 refills | Status: DC | PRN

## 2017-03-11 MED FILL — CYCLOBENZAPRINE 10 MG TAB: 10 | 15 days supply | Qty: 30 | Fill #0

## 2017-03-11 NOTE — Patient Instructions (Signed)
It was good to see you today as always!   Use the flexeril at bedtime- a 1/2 pill may be enough. If you are not working/ driving you can take one during the day as well Try some naproxen (Aleve) as well- let me know if you need anything further prior to your visit with Dr. Barbaraann Barthel

## 2017-03-11 NOTE — Progress Notes (Signed)
[place] at [facility] [place], [place], [place] [address] [phone]  Date:  03/11/2017   Name:  Kathy King   DOB:  1955-06-30   MRN:  [id number]  PCP:  Darreld Mclean, MD    Chief Complaint: Back Pain   History of Present Illness:  Kathy King is a 61 y.o. very pleasant female patient who presents with the following:  Here today with back pain- she saw Dr. Barbaraann Barthel for this back in August.  However over the last month or so it is has come back and is bothering her more again now  She feels really sore in her back, and in the morning she has more pain She was treated with voltaren and some stretches- however the voltaren bothered her back.    Heat, stretching can help.  Staying in any one position for too long, or leaning over- like to clean the cat litter- is very painful  She is not really having sciatica type pain- which she has had in the past   No fever or chills, no nausea or vomiting She does have an appt to see Hudnal in about 2 weeks.      Patient Active Problem List   Diagnosis Date Noted  . Low back pain 12/12/2016  . Pneumonia due to organism 05/06/2016  . Viral URI with cough 01/30/2015  . Vitamin D deficiency   . Cough variant asthma   . Trochanteric bursitis of left hip 10/22/2011  . Lumbar radiculopathy 10/22/2011  . SCIATICA, LEFT 08/09/2009  . OSTEOPENIA 08/09/2009    Past Medical History:  Diagnosis Date  . Adenomatous colon polyp    tubular  . CHICKENPOX, HX OF   . OSTEOPENIA   . SCIATICA, LEFT 08/09/2009   s/p PT, NSAIDs  . Unspecified vitamin D deficiency     Past Surgical History:  Procedure Laterality Date  . Arthroscopic shoulder  2008  . CERVICAL FUSION  2006  . CESAREAN SECTION  88 & 93   x's 2  . TONSILLECTOMY  1965    Social History   Tobacco Use  . Smoking status: Never Smoker  . Smokeless tobacco: Never Used  Substance Use Topics  . Alcohol use: No    Alcohol/week: 0.0 oz  . Drug use: No    Family History  Problem Relation Age of Onset  . Memory loss Mother   . Hypertension Mother   . Hyperlipidemia Mother   . Arthritis Other        grandparent  . Breast cancer Maternal Grandmother   . Seizures Son     Allergies  Allergen Reactions  . Sulfonamide Derivatives     REACTION: unspecified; as child    Medication list has been reviewed and updated.  Current Outpatient Medications on File Prior to Visit  Medication Sig Dispense Refill  . calcium gluconate 500 MG tablet Take 500 mg by mouth daily.      . Cholecalciferol (VITAMIN D) 2000 UNITS CAPS Take by mouth.      . diclofenac (VOLTAREN) 75 MG EC tablet Take 1 tablet (75 mg total) by mouth 2 (two) times daily. 60 tablet 1   No current facility-administered medications on file prior to visit.     Review of Systems:  As per HPI- otherwise negative.   Physical Examination: Vitals:   03/11/17 1125  BP: 120/72  Pulse: 97  Resp: 16  Temp: 98.6 F (37  C)  SpO2: 97%   Vitals:   03/11/17 1125  Weight: 175 lb (79.4 kg)  Height: 5\' 4"  (1.626 m)   Body mass index is 30.04 kg/m. Ideal Body Weight: Weight in (lb) to have BMI = 25: 145.3  GEN: WDWN, NAD, Non-toxic, A & O x 3, overweight, looks well HEENT: Atraumatic, Normocephalic. Neck supple. No masses, No LAD. Ears and Nose: No external deformity. CV: RRR, No M/G/R. No JVD. No thrill. No extra heart sounds. PULM: CTA B, no wheezes, crackles, rhonchi. No retractions. No resp. distress. No accessory muscle use. ABD: S, NT, ND, +BS. No rebound. No HSM. EXTR: No c/c/e NEURO Normal gait.  PSYCH: Normally interactive. Conversant. Not depressed or anxious appearing.  Calm demeanor.  Normal ROM of both hips, no pain with ROM of hips She is quite tender over the bilateral SI joints, esp the right.  Normal spine extension and flexion Normal strength of both legs, normal DTR, negative SLR bilaterally   Dg Lumbar Spine  Complete  Result Date: 03/11/2017 CLINICAL DATA:  Low back pain EXAM: LUMBAR SPINE - COMPLETE 4+ VIEW COMPARISON:  01/15/2010 FINDINGS: Minimal scoliosis of the lumbar spine, apex right. Grade 1 anterolisthesis of L3 on L4. Vertebral body heights are normal. Mild degenerative disc changes at L3-L4 and L4-L5. IMPRESSION: Mild degenerative changes.  No acute osseous abnormality. Electronically Signed   By: Donavan Foil M.D.   On: 03/11/2017 14:44   Dg Pelvis 1-2 Views  Result Date: 03/11/2017 CLINICAL DATA:  Low back pain for mos. EXAM: PELVIS - 1-2 VIEW COMPARISON:  None. FINDINGS: The pelvis and sacrum normal. Proximal femurs normal. No aggressive osseous lesion. IMPRESSION: Normal pelvic radiograph. Electronically Signed   By: Suzy Bouchard M.D.   On: 03/11/2017 11:53    Assessment and Plan: Chronic bilateral low back pain without sciatica - Plan: DG Lumbar Spine Complete, DG Pelvis 1-2 Views, cyclobenzaprine (FLEXERIL) 10 MG tablet  Here today with lower back pain- suspect she has SI joint pain She has not been able to tolerate voltaren- I hesitate to use celebrex due to her history of sulfa allergy as a child, details unknown Will have your use naptroxen OTC, and flexeril at night She is seeing Dr. Barbaraann Barthel in about 10 days for followup   Signed Lamar Blinks, MD

## 2017-03-24 ENCOUNTER — Encounter: Payer: Self-pay | Admitting: Family Medicine

## 2017-03-24 ENCOUNTER — Ambulatory Visit (INDEPENDENT_AMBULATORY_CARE_PROVIDER_SITE_OTHER): Payer: 59 | Admitting: Family Medicine

## 2017-03-24 DIAGNOSIS — G8929 Other chronic pain: Secondary | ICD-10-CM | POA: Diagnosis not present

## 2017-03-24 DIAGNOSIS — M5441 Lumbago with sciatica, right side: Secondary | ICD-10-CM

## 2017-03-24 DIAGNOSIS — M5442 Lumbago with sciatica, left side: Secondary | ICD-10-CM

## 2017-03-24 NOTE — Patient Instructions (Signed)
You have SI joint dysfunction/sacroiliitis. Continue home exercises and stretches - hold for 20-30 seconds, repeat 3 times at least once a day. Standing hip rotations, hip side raises 3 sets of 10 once a day. Add ankle weight if these become too easy. Aleve 1-2 tabs twice a day with food as needed. Treatments to consider: physical therapy even if you go only once a week (here or to [facility] which has extended hours a couple days a week), chiropractic care Higher education careers adviser in [place]), prednisone dose pack, injections, imaging of lumbar spine (MRI).

## 2017-03-26 ENCOUNTER — Encounter: Payer: Self-pay | Admitting: Family Medicine

## 2017-03-26 NOTE — Assessment & Plan Note (Signed)
Consistent with SI joint dysfunction and sacroiliitis.  Independently reviewed radiographs and no evidence fracture, other bony abnormalities to account for her pain.  Reviewed home exercises and stretches to do daily.  Aleve as needed.  We discussed physical therapy, chiropractic care, prednisone dose pack, SI joint injections, imaging of lumbar spine for referral of pain.  She is leaning toward physical therapy but will let us know after looking into the cost.

## 2017-03-26 NOTE — Progress Notes (Signed)
PCP: Copland, Gay Filler, MD  Subjective:   HPI: Patient is a 61 y.o. female here for low back pain.  8/15: Patient reports she's had several years of problems with low back. Also known history of trochanteric bursitis and had injections, physical therapy for that. Current pain been worsening over past 6 months. Describes as constant dull ache in low back left worse than right. Bothers with prolonged sitting, bending forward. Has to constantly change positions. Did have some radiation into left leg posteriorly last week and gets pressure sensation into legs at times. Hot baths, ibuprofen help. No bowel/bladder dysfunction. No numbness or tingling.  11/27: Patient reports her back got a little better following last visit but didn't resolve. Then more recently has worsened. Pain currently 3/10 and a constant ache. Worse with bending over. Stopped diclofenac because it was causing stomach problems. Using heating pad Currently on flexeril and aleve. Doing home stretches. Some achiness into back of both legs also. Had some numbness in 2nd, 3rd toes of left foot this weekend but better now. No bowel/bladder dysfunction.   Past Medical History:  Diagnosis Date  . Adenomatous colon polyp    tubular  . CHICKENPOX, HX OF   . OSTEOPENIA   . SCIATICA, LEFT 08/09/2009   s/p PT, NSAIDs  . Unspecified vitamin D deficiency     Current Outpatient Medications on File Prior to Visit  Medication Sig Dispense Refill  . calcium gluconate 500 MG tablet Take 500 mg by mouth daily.      . Cholecalciferol (VITAMIN D) 2000 UNITS CAPS Take by mouth.      . cyclobenzaprine (FLEXERIL) 10 MG tablet Take 1 tablet (10 mg total) 2 (two) times daily as needed by mouth for muscle spasms. 30 tablet 0   No current facility-administered medications on file prior to visit.     Past Surgical History:  Procedure Laterality Date  . Arthroscopic shoulder  2008  . CERVICAL FUSION  2006  . CESAREAN SECTION   88 & 93   x's 2  . TONSILLECTOMY  1965    Allergies  Allergen Reactions  . Sulfonamide Derivatives     REACTION: unspecified; as child    Social History   Socioeconomic History  . Marital status: Married    Spouse name: Not on file  . Number of children: Not on file  . Years of education: Not on file  . Highest education level: Not on file  Social Needs  . Financial resource strain: Not on file  . Food insecurity - worry: Not on file  . Food insecurity - inability: Not on file  . Transportation needs - medical: Not on file  . Transportation needs - non-medical: Not on file  Occupational History  . Not on file  Tobacco Use  . Smoking status: Never Smoker  . Smokeless tobacco: Never Used  Substance and Sexual Activity  . Alcohol use: No    Alcohol/week: 0.0 oz  . Drug use: No  . Sexual activity: Not on file  Other Topics Concern  . Not on file  Social History Narrative   Married, lives with spouse and kids    Family History  Problem Relation Age of Onset  . Memory loss Mother   . Hypertension Mother   . Hyperlipidemia Mother   . Arthritis Other        grandparent  . Breast cancer Maternal Grandmother   . Seizures Son     BP 114/79   Pulse 83  Ht 5\' 4"  (1.626 m)   BMI 30.04 kg/m   Review of Systems: See HPI above.     Objective:  Physical Exam:  Gen: NAD, comfortable in exam room.  Back: No gross deformity, scoliosis. TTP Bilateral SI joints.  No midline tenderness, piriformis tenderness. FROM. Strength LEs 5/5 all muscle groups.   2+ MSRs in patellar and achilles tendons, equal bilaterally. Negative SLRs. Sensation intact to light touch bilaterally. Negative logroll bilateral hips Negative fabers and piriformis stretches.  Bilateral hips: No deformity. No tenderness except SI joint noted above. FROM with 5/5 strength. Negative logroll. Decreased SI joint motion bilaterally with pain. Negative piriformis stretches.   Assessment &  Plan:  1. Low back pain - Consistent with SI joint dysfunction and sacroiliitis.  Independently reviewed radiographs and no evidence fracture, other bony abnormalities to account for her pain.  Reviewed home exercises and stretches to do daily.  Aleve as needed.  We discussed physical therapy, chiropractic care, prednisone dose pack, SI joint injections, imaging of lumbar spine for referral of pain.  She is leaning toward physical therapy but will let us know after looking into the cost.

## 2017-05-06 ENCOUNTER — Ambulatory Visit (INDEPENDENT_AMBULATORY_CARE_PROVIDER_SITE_OTHER): Payer: 59

## 2017-05-06 DIAGNOSIS — Z23 Encounter for immunization: Secondary | ICD-10-CM

## 2017-06-01 DIAGNOSIS — F4323 Adjustment disorder with mixed anxiety and depressed mood: Secondary | ICD-10-CM | POA: Diagnosis not present

## 2017-06-06 ENCOUNTER — Other Ambulatory Visit: Payer: Self-pay | Admitting: Family Medicine

## 2017-06-06 MED ORDER — OSELTAMIVIR PHOSPHATE 75 MG PO CAPS: 75.0000 mg | ORAL_CAPSULE | Freq: Two times a day (BID) | ORAL | 0 refills | Status: AC

## 2017-06-06 NOTE — Progress Notes (Signed)
Discussion with patient suggestive of influenza. Sick contacts in office. S/s's w/in 48 hours, will start Tamiflu, pt notified.

## 2017-06-16 DIAGNOSIS — F4322 Adjustment disorder with anxiety: Secondary | ICD-10-CM | POA: Diagnosis not present

## 2017-06-23 DIAGNOSIS — F4322 Adjustment disorder with anxiety: Secondary | ICD-10-CM | POA: Diagnosis not present

## 2017-09-03 ENCOUNTER — Encounter: Payer: 59 | Admitting: Family Medicine

## 2017-11-23 ENCOUNTER — Telehealth: Payer: Self-pay

## 2017-11-23 NOTE — Telephone Encounter (Signed)
Entered chart to see when last physical was per patient request. Patient notified of date.

## 2017-12-08 NOTE — Progress Notes (Signed)
[place] at [facility] [place], [place], [place] [address] [phone]  Date:  12/10/2017   Name:  Kathy King   DOB:  Jan 19, 1956   MRN:  [id number]  PCP:  Darreld Mclean, MD    Chief Complaint: Annual Exam   History of Present Illness:  Kathy King is a 62 y.o. very pleasant female patient who presents with the following:  Here today for a CPE Generally in good health  Pap: UTD Mammo: UTD Colon: UTD Immun: UTD, has had Tdap and shingrix Labs: a year ago, do today   She has been quite busy with work She notes that she is having a harder time keeping up with her work load and feels tired more- she wonders if this is just due to getting older. She is not sleeping that well-she thinks the may be due to some underlying anxiety about having to get up quite early to get her day started  She is asleep by 9, but will start waking up around 2 am and be sleep fitfully from 2 am on She has to get up at about 4 am to start her day On the weekend when she can sleep longer she feels better during the day  She does snore- but her husband has not noted her stopping or pausing with her breathing She does take an OTC sleep aid- doxylamine- prn and this may help her some She sometimes feels really tired in the am, but sometimes she feels ok  Her mood is good She is having hot flashes still however- she is not sure if these will ever go away! Her legs may ache at the end of the work- day; not so much an issue on the weekend  Also she has chronic left hip bursitis for several years  Taking otc vitamin D 2000 IU daily, calcium  Patient Active Problem List   Diagnosis Date Noted  . Low back pain 12/12/2016  . Vitamin D deficiency   . Cough variant asthma   . Lumbar radiculopathy 10/22/2011  . SCIATICA, LEFT 08/09/2009  . OSTEOPENIA 08/09/2009    Past Medical History:  Diagnosis Date  . Adenomatous colon polyp    tubular  . CHICKENPOX, HX OF   . OSTEOPENIA   . SCIATICA, LEFT 08/09/2009   s/p PT, NSAIDs  . Unspecified vitamin D deficiency     Past Surgical History:  Procedure Laterality Date  . Arthroscopic shoulder  2008  . CERVICAL FUSION  2006  . CESAREAN SECTION  88 & 93   x's 2  . TONSILLECTOMY  1965    Social History   Tobacco Use  . Smoking status: Never Smoker  . Smokeless tobacco: Never Used  Substance Use Topics  . Alcohol use: No    Alcohol/week: 0.0 standard drinks  . Drug use: No    Family History  Problem Relation Age of Onset  . Memory loss Mother   . Hypertension Mother   . Hyperlipidemia Mother   . Arthritis Other        grandparent  . Breast cancer Maternal Grandmother   . Seizures Son     Allergies  Allergen Reactions  . Sulfonamide Derivatives     REACTION: unspecified; as child    Medication list has been reviewed and updated.  Current Outpatient Medications on File Prior to Visit  Medication Sig Dispense Refill  . calcium gluconate 500 MG tablet Take  500 mg by mouth daily.      . Cholecalciferol (VITAMIN D) 2000 UNITS CAPS Take by mouth.       No current facility-administered medications on file prior to visit.     Review of Systems:  As per HPI- otherwise negative. No fever or chills No CP or SOB  Physical Examination: Vitals:   12/10/17 1700  BP: 124/72  Pulse: 81  Resp: 16  Temp: 97.8 F (36.6 C)  SpO2: 99%   Vitals:   12/10/17 1700  Weight: 177 lb (80.3 kg)  Height: 5\' 4"  (1.626 m)   Body mass index is 30.38 kg/m. Ideal Body Weight: Weight in (lb) to have BMI = 25: 145.3  GEN: WDWN, NAD, Non-toxic, A & O x 3, overweight, looks well  HEENT: Atraumatic, Normocephalic. Neck supple. No masses, No LAD.  Bilateral TM wnl, oropharynx normal.  PEERL,EOMI.   Ears and Nose: No external deformity. CV: RRR, No M/G/R. No JVD. No thrill. No extra heart sounds. PULM: CTA B, no wheezes, crackles, rhonchi. No retractions. No resp.  distress. No accessory muscle use. ABD: S, NT, ND, +BS. No rebound. No HSM. EXTR: No c/c/trace edema at the sock line bilaterally  NEURO Normal gait.  PSYCH: Normally interactive. Conversant. Not depressed or anxious appearing.  Calm demeanor.    Assessment and Plan: Physical exam  Screening for diabetes mellitus - Plan: Comprehensive metabolic panel, Hemoglobin A1c  Screening for deficiency anemia - Plan: CBC  Screening for hyperlipidemia - Plan: Lipid panel  Other fatigue - Plan: TSH  Sleep concern - Plan: traZODone (DESYREL) 50 MG tablet  CPE today Doing well overall Her main concern is that she is often tired.  However she works quite long hours and is otherwise stretched thin by aging parents, her own kids and grandkids, and she is not getting enough sleep.  Discussed ways for her to try and get more sleep- perhaps she could stay asleep until 5 am, try to nap on the weekend when she can She has used trazodone years ago and would like to see if this may help with her fitfull sleep pattern When she has better insurance coverage consider doing a sleep study but she cannot afford right now  Will plan further follow- up pending labs.   Signed Lamar Blinks, MD

## 2017-12-10 ENCOUNTER — Ambulatory Visit (INDEPENDENT_AMBULATORY_CARE_PROVIDER_SITE_OTHER): Payer: 59 | Admitting: Family Medicine

## 2017-12-10 ENCOUNTER — Encounter: Payer: Self-pay | Admitting: Family Medicine

## 2017-12-10 VITALS — BP 124/72 | HR 81 | Temp 97.8°F | Resp 16 | Ht 64.0 in | Wt 177.0 lb

## 2017-12-10 DIAGNOSIS — R5383 Other fatigue: Secondary | ICD-10-CM

## 2017-12-10 DIAGNOSIS — Z130 Encounter for screening for diseases of the blood and blood-forming organs and certain disorders involving the immune mechanism: Secondary | ICD-10-CM

## 2017-12-10 DIAGNOSIS — Z0000 Encounter for general adult medical examination without abnormal findings: Secondary | ICD-10-CM | POA: Diagnosis not present

## 2017-12-10 DIAGNOSIS — Z13220 Encounter for screening for lipoid disorders: Secondary | ICD-10-CM | POA: Diagnosis not present

## 2017-12-10 DIAGNOSIS — Z131 Encounter for screening for diabetes mellitus: Secondary | ICD-10-CM

## 2017-12-10 DIAGNOSIS — Z7689 Persons encountering health services in other specified circumstances: Secondary | ICD-10-CM | POA: Diagnosis not present

## 2017-12-10 MED ORDER — TRAZODONE HCL 50 MG PO TABS: 25.0000 mg | ORAL_TABLET | Freq: Every evening | ORAL | 6 refills | Status: DC | PRN

## 2017-12-10 MED FILL — traZODone HCL 50 MG TABS: 50 | 30 days supply | Qty: 30 | Fill #0

## 2017-12-10 NOTE — Patient Instructions (Addendum)
It was great to see you today- take care and I will be in touch with your labs asap It sounds like you are not getting enough sleep- do try and stay in bed a bit longer in the mornings so you can catch more rest Napping on the weekend may also be helpful When you are able, we might want to set up a sleep study for you I gave you an rx for trazodone to use as needed for sleep- let me know how this works for you  Try some compression socks - I like the [employer] brand myself    Health Maintenance for Postmenopausal Women Menopause is a normal process in which your reproductive ability comes to an end. This process happens gradually over a span of months to years, usually between the ages of 23 and 45. Menopause is complete when you have missed 12 consecutive menstrual periods. It is important to talk with your health care provider about some of the most common conditions that affect postmenopausal women, such as heart disease, cancer, and bone loss (osteoporosis). Adopting a healthy lifestyle and getting preventive care can help to promote your health and wellness. Those actions can also lower your chances of developing some of these common conditions. What should I know about menopause? During menopause, you may experience a number of symptoms, such as:  Moderate-to-severe hot flashes.  Night sweats.  Decrease in sex drive.  Mood swings.  Headaches.  Tiredness.  Irritability.  Memory problems.  Insomnia.  Choosing to treat or not to treat menopausal changes is an individual decision that you make with your health care provider. What should I know about hormone replacement therapy and supplements? Hormone therapy products are effective for treating symptoms that are associated with menopause, such as hot flashes and night sweats. Hormone replacement carries certain risks, especially as you become older. If you are thinking about using estrogen or estrogen with progestin treatments, discuss  the benefits and risks with your health care provider. What should I know about heart disease and stroke? Heart disease, heart attack, and stroke become more likely as you age. This may be due, in part, to the hormonal changes that your body experiences during menopause. These can affect how your body processes dietary fats, triglycerides, and cholesterol. Heart attack and stroke are both medical emergencies. There are many things that you can do to help prevent heart disease and stroke:  Have your blood pressure checked at least every 1-2 years. High blood pressure causes heart disease and increases the risk of stroke.  If you are 74-32 years old, ask your health care provider if you should take aspirin to prevent a heart attack or a stroke.  Do not use any tobacco products, including cigarettes, chewing tobacco, or electronic cigarettes. If you need help quitting, ask your health care provider.  It is important to eat a healthy diet and maintain a healthy weight. ? Be sure to include plenty of vegetables, fruits, low-fat dairy products, and lean protein. ? Avoid eating foods that are high in solid fats, added sugars, or salt (sodium).  Get regular exercise. This is one of the most important things that you can do for your health. ? Try to exercise for at least 150 minutes each week. The type of exercise that you do should increase your heart rate and make you sweat. This is known as moderate-intensity exercise. ? Try to do strengthening exercises at least twice each week. Do these in addition to the moderate-intensity  exercise.  Know your numbers.Ask your health care provider to check your cholesterol and your blood glucose. Continue to have your blood tested as directed by your health care provider.  What should I know about cancer screening? There are several types of cancer. Take the following steps to reduce your risk and to catch any cancer development as early as possible. Breast  Cancer  Practice breast self-awareness. ? This means understanding how your breasts normally appear and feel. ? It also means doing regular breast self-exams. Let your health care provider know about any changes, no matter how small.  If you are 69 or older, have a clinician do a breast exam (clinical breast exam or CBE) every year. Depending on your age, family history, and medical history, it may be recommended that you also have a yearly breast X-ray (mammogram).  If you have a family history of breast cancer, talk with your health care provider about genetic screening.  If you are at high risk for breast cancer, talk with your health care provider about having an MRI and a mammogram every year.  Breast cancer (BRCA) gene test is recommended for women who have family members with BRCA-related cancers. Results of the assessment will determine the need for genetic counseling and BRCA1 and for BRCA2 testing. BRCA-related cancers include these types: ? Breast. This occurs in males or females. ? Ovarian. ? Tubal. This may also be called fallopian tube cancer. ? Cancer of the abdominal or pelvic lining (peritoneal cancer). ? Prostate. ? Pancreatic.  Cervical, Uterine, and Ovarian Cancer Your health care provider may recommend that you be screened regularly for cancer of the pelvic organs. These include your ovaries, uterus, and vagina. This screening involves a pelvic exam, which includes checking for microscopic changes to the surface of your cervix (Pap test).  For women ages 21-65, health care providers may recommend a pelvic exam and a Pap test every three years. For women ages 46-65, they may recommend the Pap test and pelvic exam, combined with testing for human papilloma virus (HPV), every five years. Some types of HPV increase your risk of cervical cancer. Testing for HPV may also be done on women of any age who have unclear Pap test results.  Other health care providers may not  recommend any screening for nonpregnant women who are considered low risk for pelvic cancer and have no symptoms. Ask your health care provider if a screening pelvic exam is right for you.  If you have had past treatment for cervical cancer or a condition that could lead to cancer, you need Pap tests and screening for cancer for at least 20 years after your treatment. If Pap tests have been discontinued for you, your risk factors (such as having a new sexual partner) need to be reassessed to determine if you should start having screenings again. Some women have medical problems that increase the chance of getting cervical cancer. In these cases, your health care provider may recommend that you have screening and Pap tests more often.  If you have a family history of uterine cancer or ovarian cancer, talk with your health care provider about genetic screening.  If you have vaginal bleeding after reaching menopause, tell your health care provider.  There are currently no reliable tests available to screen for ovarian cancer.  Lung Cancer Lung cancer screening is recommended for adults 53-57 years old who are at high risk for lung cancer because of a history of smoking. A yearly low-dose CT scan  of the lungs is recommended if you:  Currently smoke.  Have a history of at least 30 pack-years of smoking and you currently smoke or have quit within the past 15 years. A pack-year is smoking an average of one pack of cigarettes per day for one year.  Yearly screening should:  Continue until it has been 15 years since you quit.  Stop if you develop a health problem that would prevent you from having lung cancer treatment.  Colorectal Cancer  This type of cancer can be detected and can often be prevented.  Routine colorectal cancer screening usually begins at age 49 and continues through age 56.  If you have risk factors for colon cancer, your health care provider may recommend that you be screened  at an earlier age.  If you have a family history of colorectal cancer, talk with your health care provider about genetic screening.  Your health care provider may also recommend using home test kits to check for hidden blood in your stool.  A small camera at the end of a tube can be used to examine your colon directly (sigmoidoscopy or colonoscopy). This is done to check for the earliest forms of colorectal cancer.  Direct examination of the colon should be repeated every 5-10 years until age 52. However, if early forms of precancerous polyps or small growths are found or if you have a family history or genetic risk for colorectal cancer, you may need to be screened more often.  Skin Cancer  Check your skin from head to toe regularly.  Monitor any moles. Be sure to tell your health care provider: ? About any new moles or changes in moles, especially if there is a change in a mole's shape or color. ? If you have a mole that is larger than the size of a pencil eraser.  If any of your family members has a history of skin cancer, especially at a young age, talk with your health care provider about genetic screening.  Always use sunscreen. Apply sunscreen liberally and repeatedly throughout the day.  Whenever you are outside, protect yourself by wearing long sleeves, pants, a wide-brimmed hat, and sunglasses.  What should I know about osteoporosis? Osteoporosis is a condition in which bone destruction happens more quickly than new bone creation. After menopause, you may be at an increased risk for osteoporosis. To help prevent osteoporosis or the bone fractures that can happen because of osteoporosis, the following is recommended:  If you are 59-37 years old, get at least 1,000 mg of calcium and at least 600 mg of vitamin D per day.  If you are older than age 78 but younger than age 63, get at least 1,200 mg of calcium and at least 600 mg of vitamin D per day.  If you are older than age 51,  get at least 1,200 mg of calcium and at least 800 mg of vitamin D per day.  Smoking and excessive alcohol intake increase the risk of osteoporosis. Eat foods that are rich in calcium and vitamin D, and do weight-bearing exercises several times each week as directed by your health care provider. What should I know about how menopause affects my mental health? Depression may occur at any age, but it is more common as you become older. Common symptoms of depression include:  Low or sad mood.  Changes in sleep patterns.  Changes in appetite or eating patterns.  Feeling an overall lack of motivation or enjoyment of activities that  you previously enjoyed.  Frequent crying spells.  Talk with your health care provider if you think that you are experiencing depression. What should I know about immunizations? It is important that you get and maintain your immunizations. These include:  Tetanus, diphtheria, and pertussis (Tdap) booster vaccine.  Influenza every year before the flu season begins.  Pneumonia vaccine.  Shingles vaccine.  Your health care provider may also recommend other immunizations. This information is not intended to replace advice given to you by your health care provider. Make sure you discuss any questions you have with your health care provider. Document Released: 06/06/2005 Document Revised: 11/02/2015 Document Reviewed: 01/16/2015 Elsevier Interactive Patient Education  2018 [employer].

## 2017-12-11 ENCOUNTER — Other Ambulatory Visit (INDEPENDENT_AMBULATORY_CARE_PROVIDER_SITE_OTHER): Payer: 59

## 2017-12-11 ENCOUNTER — Encounter: Payer: Self-pay | Admitting: Family Medicine

## 2017-12-11 DIAGNOSIS — Z131 Encounter for screening for diabetes mellitus: Secondary | ICD-10-CM

## 2017-12-11 DIAGNOSIS — R5383 Other fatigue: Secondary | ICD-10-CM | POA: Diagnosis not present

## 2017-12-11 DIAGNOSIS — Z130 Encounter for screening for diseases of the blood and blood-forming organs and certain disorders involving the immune mechanism: Secondary | ICD-10-CM

## 2017-12-11 DIAGNOSIS — Z13220 Encounter for screening for lipoid disorders: Secondary | ICD-10-CM

## 2017-12-11 LAB — CBC
HCT: 39.3 % (ref 36.0–46.0)
Hemoglobin: 13.1 g/dL (ref 12.0–15.0)
MCHC: 33.2 g/dL (ref 30.0–36.0)
MCV: 89.8 fl (ref 78.0–100.0)
Platelets: 207 10*3/uL (ref 150.0–400.0)
RBC: 4.37 Mil/uL (ref 3.87–5.11)
RDW: 12.7 % (ref 11.5–15.5)
WBC: 4.9 10*3/uL (ref 4.0–10.5)

## 2017-12-11 LAB — COMPREHENSIVE METABOLIC PANEL
ALT: 22 U/L (ref 0–35)
AST: 20 U/L (ref 0–37)
Albumin: 4.3 g/dL (ref 3.5–5.2)
Alkaline Phosphatase: 70 U/L (ref 39–117)
BUN: 20 mg/dL (ref 6–23)
CO2: 32 mEq/L (ref 19–32)
Calcium: 9.7 mg/dL (ref 8.4–10.5)
Chloride: 104 mEq/L (ref 96–112)
Creatinine, Ser: 0.99 mg/dL (ref 0.40–1.20)
GFR: 60.88 mL/min (ref >60.00)
Glucose, Bld: 84 mg/dL (ref 70–99)
Potassium: 4.7 mEq/L (ref 3.5–5.1)
Sodium: 141 mEq/L (ref 135–145)
Total Bilirubin: 0.7 mg/dL (ref 0.2–1.2)
Total Protein: 6.2 g/dL (ref 6.0–8.3)

## 2017-12-11 LAB — LIPID PANEL
Cholesterol: 206 mg/dL — ABNORMAL HIGH (ref 0–200)
HDL: 73.5 mg/dL (ref >39.00)
LDL Cholesterol: 120 mg/dL — ABNORMAL HIGH (ref 0–99)
NonHDL: 132.56
Total CHOL/HDL Ratio: 3
Triglycerides: 64 mg/dL (ref 0.0–149.0)
VLDL: 12.8 mg/dL (ref 0.0–40.0)

## 2017-12-11 LAB — HEMOGLOBIN A1C: Hgb A1c MFr Bld: 5.5 % (ref 4.6–6.5)

## 2017-12-11 LAB — TSH: TSH: 3.11 u[IU]/mL (ref 0.35–4.50)

## 2017-12-25 ENCOUNTER — Other Ambulatory Visit: Payer: Self-pay | Admitting: Family Medicine

## 2017-12-25 DIAGNOSIS — Z1231 Encounter for screening mammogram for malignant neoplasm of breast: Secondary | ICD-10-CM

## 2018-01-02 ENCOUNTER — Other Ambulatory Visit: Payer: Self-pay | Admitting: Family Medicine

## 2018-01-02 MED ORDER — AZITHROMYCIN 250 MG PO TABS: ORAL_TABLET | ORAL | 0 refills | Status: DC

## 2018-01-02 NOTE — Progress Notes (Signed)
Spoke w pt regarding s/s's. Sound viral for now, but will call in given weekend circumstances. She knows what to look out for. All questions answered.

## 2018-01-03 ENCOUNTER — Other Ambulatory Visit: Payer: Self-pay | Admitting: Family Medicine

## 2018-01-03 MED ORDER — PREDNISONE 20 MG PO TABS: 40.0000 mg | ORAL_TABLET | Freq: Every day | ORAL | 0 refills | Status: AC

## 2018-01-05 ENCOUNTER — Other Ambulatory Visit: Payer: Self-pay | Admitting: Family Medicine

## 2018-01-05 MED ORDER — BENZONATATE 100 MG PO CAPS: 100.0000 mg | ORAL_CAPSULE | Freq: Three times a day (TID) | ORAL | 0 refills | Status: DC | PRN

## 2018-01-05 MED ORDER — HYDROCODONE-HOMATROPINE 5-1.5 MG/5ML PO SYRP: 5.0000 mL | ORAL_SOLUTION | Freq: Three times a day (TID) | ORAL | 0 refills | Status: DC | PRN

## 2018-01-05 MED FILL — HYDROCODONE-HOMATROPINE SOL: 5-1.5 | 8 days supply | Qty: 120 | Fill #0

## 2018-01-05 MED FILL — BENZONATATE 100 MG CAPSULE: 100 | 10 days supply | Qty: 30 | Fill #0

## 2018-01-14 ENCOUNTER — Other Ambulatory Visit: Payer: Self-pay | Admitting: Family Medicine

## 2018-01-14 MED ORDER — MONTELUKAST SODIUM 10 MG PO TABS: 10.0000 mg | ORAL_TABLET | Freq: Every day | ORAL | 1 refills | Status: DC

## 2018-01-14 MED ORDER — LEVOCETIRIZINE DIHYDROCHLORIDE 5 MG PO TABS: 5.0000 mg | ORAL_TABLET | Freq: Every evening | ORAL | 1 refills | Status: DC

## 2018-01-14 MED FILL — MONTELUKAST SOD 10 MG TAB: 10 | 90 days supply | Qty: 90 | Fill #0

## 2018-01-14 MED FILL — LEVOCETIRIZINE 5 MG TABLET: 5 | 90 days supply | Qty: 90 | Fill #0

## 2018-01-14 NOTE — Progress Notes (Signed)
Called in PO antihistamine and Singulair for pt. She was notified.

## 2018-02-02 ENCOUNTER — Ambulatory Visit
Admission: RE | Admit: 2018-02-02 | Discharge: 2018-02-02 | Disposition: A | Discharge status: Home / Self Care | Payer: 59 | Source: Ambulatory Visit | Attending: Family Medicine | Admitting: Family Medicine

## 2018-02-02 DIAGNOSIS — Z1231 Encounter for screening mammogram for malignant neoplasm of breast: Secondary | ICD-10-CM

## 2018-04-14 ENCOUNTER — Other Ambulatory Visit: Payer: Self-pay

## 2018-04-14 DIAGNOSIS — Z7689 Persons encountering health services in other specified circumstances: Secondary | ICD-10-CM

## 2018-04-14 MED ORDER — TRAZODONE HCL 50 MG PO TABS: 25.0000 mg | ORAL_TABLET | Freq: Every evening | ORAL | 6 refills | Status: DC | PRN

## 2018-04-14 MED FILL — traZODone HCL 50 MG TABS: 50 | 30 days supply | Qty: 30 | Fill #1

## 2018-05-11 NOTE — Progress Notes (Deleted)
[place] at [facility] [address] [place], [place] [address] [phone]  Date:  05/13/2018   Name:  Kathy King   DOB:  18-Jan-1956   MRN:  [id number]  PCP:  Kathy Mclean, MD    Chief Complaint: No chief complaint on file.   History of Present Illness:  Kathy King is a 63 y.o. very pleasant female patient who presents with the following:  Generally healthy woman here today with foot pain.  She does have history of osteopenia and lumbar radiculopathy  Complete labs done in August 2019 Patient Active Problem List   Diagnosis Date Noted  . Low back pain 12/12/2016  . Vitamin D deficiency   . Cough variant asthma   . Lumbar radiculopathy 10/22/2011  . SCIATICA, LEFT 08/09/2009  . OSTEOPENIA 08/09/2009    Past Medical History:  Diagnosis Date  . Adenomatous colon polyp    tubular  . CHICKENPOX, HX OF   . OSTEOPENIA   . SCIATICA, LEFT 08/09/2009   s/p PT, NSAIDs  . Unspecified vitamin D deficiency     Past Surgical History:  Procedure Laterality Date  . Arthroscopic shoulder  2008  . CERVICAL FUSION  2006  . CESAREAN SECTION  88 & 93   x's 2  . TONSILLECTOMY  1965    Social History   Tobacco Use  . Smoking status: Never Smoker  . Smokeless tobacco: Never Used  Substance Use Topics  . Alcohol use: No    Alcohol/week: 0.0 standard drinks  . Drug use: No    Family History  Problem Relation Age of Onset  . Memory loss Mother   . Hypertension Mother   . Hyperlipidemia Mother   . Arthritis Other        grandparent  . Breast cancer Maternal Grandmother   . Seizures Son     Allergies  Allergen Reactions  . Sulfonamide Derivatives     REACTION: unspecified; as child    Medication list has been reviewed and updated.  Current Outpatient Medications on File Prior to Visit  Medication Sig Dispense Refill  . benzonatate (TESSALON) 100 MG capsule Take 1 capsule (100 mg total) by mouth 3  (three) times daily as needed. 30 capsule 0  . calcium gluconate 500 MG tablet Take 500 mg by mouth daily.      . Cholecalciferol (VITAMIN D) 2000 UNITS CAPS Take by mouth.      Marland Kitchen HYDROcodone-homatropine (HYCODAN) 5-1.5 MG/5ML syrup Take 5 mLs by mouth every 8 (eight) hours as needed for cough. 120 mL 0  . levocetirizine (XYZAL) 5 MG tablet Take 1 tablet (5 mg total) by mouth every evening. 90 tablet 1  . montelukast (SINGULAIR) 10 MG tablet Take 1 tablet (10 mg total) by mouth at bedtime. 90 tablet 1  . traZODone (DESYREL) 50 MG tablet Take 0.5-1 tablets (25-50 mg total) by mouth at bedtime as needed for sleep. 30 tablet 6   No current facility-administered medications on file prior to visit.     Review of Systems:  As per HPI- otherwise negative.   Physical Examination: There were no vitals filed for this visit. There were no vitals filed for this visit. There is no height or weight on file to calculate BMI. Ideal Body Weight:    GEN: WDWN, NAD, Non-toxic, A & O x 3 HEENT: Atraumatic, Normocephalic. Neck supple. No masses, No LAD. Ears and Nose: No external deformity. CV:  RRR, No M/G/R. No JVD. No thrill. No extra heart sounds. PULM: CTA B, no wheezes, crackles, rhonchi. No retractions. No resp. distress. No accessory muscle use. ABD: S, NT, ND, +BS. No rebound. No HSM. EXTR: No c/c/e NEURO Normal gait.  PSYCH: Normally interactive. Conversant. Not depressed or anxious appearing.  Calm demeanor.    Assessment and Plan: ***  Signed Lamar Blinks, MD

## 2018-05-13 ENCOUNTER — Ambulatory Visit (INDEPENDENT_AMBULATORY_CARE_PROVIDER_SITE_OTHER): Payer: No Typology Code available for payment source | Admitting: Family Medicine

## 2018-05-13 ENCOUNTER — Ambulatory Visit: Payer: 59 | Admitting: Family Medicine

## 2018-05-13 ENCOUNTER — Encounter: Payer: Self-pay | Admitting: Family Medicine

## 2018-05-13 VITALS — BP 126/80 | HR 69 | Resp 16

## 2018-05-13 DIAGNOSIS — M79672 Pain in left foot: Secondary | ICD-10-CM

## 2018-05-13 NOTE — Progress Notes (Signed)
[place] at [facility] [address] [place], [place] [address] [phone]  Date:  05/13/2018   Name:  Kathy King   DOB:  1956/04/07   MRN:  [id number]  PCP:  Darreld Mclean, MD    Chief Complaint: Foot Pain (left foot pain, ball of foot pain, several months)   History of Present Illness:  Kathy King is a 63 y.o. very pleasant female patient who presents with the following:  She has noted left foot pain for maybe 4 months, at the base of the 2nd toe.  Sometimes the 2nd/3rd/4th toes will feel numb after walking for a prolonged period.  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  She does find that her athletic shoes offer some relief when she wears them at home She tried a gel pad in the toe of the left shoe but it did not really help  Never had this in past No known injury  She is otherwise feeling well  Patient Active Problem List   Diagnosis Date Noted  . Low back pain 12/12/2016  . Vitamin D deficiency   . Cough variant asthma   . Lumbar radiculopathy 10/22/2011  . SCIATICA, LEFT 08/09/2009  . OSTEOPENIA 08/09/2009    Past Medical History:  Diagnosis Date  . Adenomatous colon polyp    tubular  . CHICKENPOX, HX OF   . OSTEOPENIA   . SCIATICA, LEFT 08/09/2009   s/p PT, NSAIDs  . Unspecified vitamin D deficiency     Past Surgical History:  Procedure Laterality Date  . Arthroscopic shoulder  2008  . CERVICAL FUSION  2006  . CESAREAN SECTION  88 & 93   x's 2  . TONSILLECTOMY  1965    Social History   Tobacco Use  . Smoking status: Never Smoker  . Smokeless tobacco: Never Used  Substance Use Topics  . Alcohol use: No    Alcohol/week: 0.0 standard drinks  . Drug use: No    Family History  Problem Relation  Age of Onset  . Memory loss Mother   . Hypertension Mother   . Hyperlipidemia Mother   . Arthritis Other        grandparent  . Breast cancer Maternal Grandmother   . Seizures Son     Allergies  Allergen Reactions  . Sulfonamide Derivatives     REACTION: unspecified; as child    Medication list has been reviewed and updated.  Current Outpatient Medications on File Prior to Visit  Medication Sig Dispense Refill  . calcium gluconate 500 MG tablet Take 500 mg by mouth daily.      . Cholecalciferol (VITAMIN D) 2000 UNITS CAPS Take by mouth.      . levocetirizine (XYZAL) 5 MG tablet Take 1 tablet (5 mg total) by mouth every evening. 90 tablet 1  . montelukast (SINGULAIR) 10 MG tablet Take 1 tablet (10 mg total) by mouth at bedtime. 90 tablet 1  . traZODone (DESYREL) 50 MG tablet Take 0.5-1 tablets (25-50 mg total) by mouth at bedtime as needed for sleep. 30 tablet 6   No current facility-administered medications on file prior to visit.     Review of Systems:  As per HPI- otherwise negative.  No fever chills, no chest pain or shortness of breath  Physical Examination: Vitals:   05/13/18 1142  BP: 126/80  Pulse: 69  Resp: 16  SpO2: 99%   There were no vitals filed for this visit. There is no height or weight on file to calculate BMI. Ideal Body Weight:    GEN: WDWN, NAD, Non-toxic, A & O x 3, as well HEENT: Atraumatic, Normocephalic. Neck supple. No masses, No LAD. Ears and Nose: No external deformity. CV: RRR, No M/G/R. No JVD. No thrill. No extra heart sounds. PULM: CTA B, no wheezes, crackles, rhonchi. No retractions. No resp. distress. No accessory muscle use. EXTR: No c/c/e NEURO Normal gait.  PSYCH: Normally interactive. Conversant. Not depressed or anxious appearing.  Calm demeanor.  No lower extremity edema, feet both appear normal with slightly low arches.  Arches are minimal when patient is standing.  Feet display normal pedal pulses, no sign of neurovascular  compromise.  At the base of the left second toe, the patient is tender to pressure.  She has a positive squeeze test.  Exam is consistent with a Morton's neuroma.  Otherwise the foot exam is normal   Assessment and Plan: Left foot pain - Plan: DG Foot Complete Left  Here today with left foot pain suspicious for Morton's neuroma.  We have ordered an x-ray which she will have done in the next couple of days. Went over some conservative measures which may be helpful, including footwear change, ice massage, anti-inflammatories.  We may refer her to sports medicine for an injection if she does not improve.  Always so nice to see you I think you have a Morton's neuroma in your left foot.  We have ordered an x-ray for you, to look for any other issue.  Please have this done at your convenience. Otherwise, you might try changing of your footwear.  A tennis shoe might be more comfortable than clogs.  The [employer] brand offers a lot of cushioning . You can also try putting a metatarsal pad into your shoe in an attempt to take pressure off the neuroma.  Ice massage and anti-inflammatory such as Aleve can be helpful. I do think Dr. Barbaraann Barthel might be helpful to you here, he might want to do a steroid injection.  Let me know if you would like a referral Signed Lamar Blinks, MD

## 2018-05-13 NOTE — Patient Instructions (Addendum)
Always so nice to see you I think you have a Morton's neuroma in your left foot.  We have ordered an x-ray for you, to look for any other issue.  Please have this done at your convenience. Otherwise, you might try changing of your footwear.  A tennis shoe might be more comfortable than clogs.  The [employer] brand offers a lot of cushioning . You can also try putting a metatarsal pad into your shoe in an attempt to take pressure off the neuroma.  Ice massage and anti-inflammatory such as Aleve can be helpful. I do think Dr. Barbaraann Barthel might be helpful to you here, he might want to do a steroid injection.  Let me know if you would like a referral

## 2018-05-18 ENCOUNTER — Ambulatory Visit (INDEPENDENT_AMBULATORY_CARE_PROVIDER_SITE_OTHER)
Admission: RE | Admit: 2018-05-18 | Discharge: 2018-05-18 | Disposition: A | Discharge status: Home / Self Care | Payer: No Typology Code available for payment source | Source: Ambulatory Visit | Attending: Family Medicine | Admitting: Family Medicine

## 2018-05-18 ENCOUNTER — Encounter: Payer: Self-pay | Admitting: Family Medicine

## 2018-05-18 DIAGNOSIS — M79672 Pain in left foot: Secondary | ICD-10-CM

## 2018-05-24 ENCOUNTER — Other Ambulatory Visit: Payer: Self-pay | Admitting: Family Medicine

## 2018-05-24 DIAGNOSIS — M79672 Pain in left foot: Secondary | ICD-10-CM

## 2018-05-24 NOTE — Progress Notes (Signed)
Pt has an appt for tomorrow scheduled

## 2018-05-25 ENCOUNTER — Ambulatory Visit: Payer: No Typology Code available for payment source | Admitting: Family Medicine

## 2018-05-25 ENCOUNTER — Ambulatory Visit: Payer: Self-pay

## 2018-05-25 ENCOUNTER — Encounter: Payer: Self-pay | Admitting: Family Medicine

## 2018-05-25 VITALS — BP 126/78 | HR 86 | Ht 64.0 in | Wt 183.0 lb

## 2018-05-25 DIAGNOSIS — M79672 Pain in left foot: Secondary | ICD-10-CM

## 2018-05-25 DIAGNOSIS — M65972 Unspecified synovitis and tenosynovitis, left ankle and foot: Secondary | ICD-10-CM | POA: Insufficient documentation

## 2018-05-25 DIAGNOSIS — M659 Synovitis and tenosynovitis, unspecified: Secondary | ICD-10-CM | POA: Insufficient documentation

## 2018-05-25 MED ORDER — PREDNISONE 50 MG PO TABS: 50.0000 mg | ORAL_TABLET | Freq: Every day | ORAL | 0 refills | Status: DC

## 2018-05-25 MED ORDER — DICLOFENAC SODIUM 2 % TD SOLN: 2.0000 g | Freq: Two times a day (BID) | TRANSDERMAL | 3 refills | Status: DC

## 2018-05-25 MED ORDER — GABAPENTIN 100 MG PO CAPS: 200.0000 mg | ORAL_CAPSULE | Freq: Every day | ORAL | 3 refills | Status: DC

## 2018-05-25 MED FILL — GABAPENTIN 100 MG CAPSULE: 100 | 30 days supply | Qty: 60 | Fill #0

## 2018-05-25 MED FILL — predniSONE 50 MG TABS: 50 | 5 days supply | Qty: 5 | Fill #0

## 2018-05-25 NOTE — Patient Instructions (Signed)
Great to see you! Ice 20 minutes 2 times daily. Usually after activity and before bed. Exercises 3 times a week.  Avoid being barefoot  Love the shoes but could look at [place] as well  pennsaid pinkie amount topically 2 times daily as needed.  Prednisone daily for 5 days for the synovitis of the toes (4 of them) And gabapentin 200mg  at night for the nerve See me again in 4 weeks and we will see how you are doing

## 2018-05-25 NOTE — Progress Notes (Signed)
[name] [address] [place], [place] [address] Phone: [phone] Subjective:   Fontaine No, am serving as a scribe for Dr. Hulan Saas.   CC: Foot pain  [id number]  Kathy King is a 63 y.o. female coming in with complaint of left foot pain since last summer. Dr. Edilia Bo feels like issue is morton's neuroma. Patient is wearing a metatarsal cookie in the left shoe. Pain decreases with rest but patient does note achiness. Sharp pain when weight bearing. Patient complains of feeling like a "marble" beneath the 2nd and 3rd metatarsal. Has not tried ice. Intermittently uses IBU.      Past Medical History:  Diagnosis Date  . Adenomatous colon polyp    tubular  . CHICKENPOX, HX OF   . OSTEOPENIA   . SCIATICA, LEFT 08/09/2009   s/p PT, NSAIDs  . Unspecified vitamin D deficiency    Past Surgical History:  Procedure Laterality Date  . Arthroscopic shoulder  2008  . CERVICAL FUSION  2006  . CESAREAN SECTION  88 & 93   x's 2  . TONSILLECTOMY  1965   Social History   Socioeconomic History  . Marital status: Married    Spouse name: Not on file  . Number of children: Not on file  . Years of education: Not on file  . Highest education level: Not on file  Occupational History  . Not on file  Social Needs  . Financial resource strain: Not on file  . Food insecurity:    Worry: Not on file    Inability: Not on file  . Transportation needs:    Medical: Not on file    Non-medical: Not on file  Tobacco Use  . Smoking status: Never Smoker  . Smokeless tobacco: Never Used  Substance and Sexual Activity  . Alcohol use: No    Alcohol/week: 0.0 standard drinks  . Drug use: No  . Sexual activity: Not on file  Lifestyle  . Physical activity:    Days per week: Not on file    Minutes per session: Not on file  . Stress: Not on file  Relationships  . Social connections:    Talks on phone: Not on file    Gets together: Not on file   Attends religious service: Not on file    Active member of club or organization: Not on file    Attends meetings of clubs or organizations: Not on file    Relationship status: Not on file  Other Topics Concern  . Not on file  Social History Narrative   Married, lives with spouse and kids   Allergies  Allergen Reactions  . Sulfonamide Derivatives     REACTION: unspecified; as child   Family History  Problem Relation Age of Onset  . Memory loss Mother   . Hypertension Mother   . Hyperlipidemia Mother   . Arthritis Other        grandparent  . Breast cancer Maternal Grandmother   . Seizures Son       Current Outpatient Medications (Respiratory):  .  levocetirizine (XYZAL) 5 MG tablet, Take 1 tablet (5 mg total) by mouth every evening. .  montelukast (SINGULAIR) 10 MG tablet, Take 1 tablet (10 mg total) by mouth at bedtime.    Current Outpatient Medications (Other):  .  calcium gluconate 500 MG tablet, Take 500 mg by mouth daily.   .  Cholecalciferol (VITAMIN D) 2000 UNITS CAPS, Take by mouth.   Marland Kitchen  traZODone (DESYREL) 50 MG tablet, Take 0.5-1 tablets (25-50 mg total) by mouth at bedtime as needed for sleep.    Past medical history, social, surgical and family history all reviewed in electronic medical record.  No pertanent information unless stated regarding to the chief complaint.   Review of Systems:  No headache, visual changes, nausea, vomiting, diarrhea, constipation, dizziness, abdominal pain, skin rash, fevers, chills, night sweats, weight loss, swollen lymph nodes, body aches, joint swelling, muscle aches, chest pain, shortness of breath, mood changes.   Objective  There were no vitals taken for this visit. Systems examined below as of    General: No apparent distress alert and oriented x3 mood and affect normal, dressed appropriately.  HEENT: Pupils equal, extraocular movements intact  Respiratory: Patient's speak in full sentences and does not appear short of  breath  Cardiovascular: No lower extremity edema, non tender, no erythema  Skin: Warm dry intact with no signs of infection or rash on extremities or on axial skeleton.  Abdomen: Soft nontender  Neuro: Cranial nerves II through XII are intact, neurovascularly intact in all extremities with 2+ DTRs and 2+ pulses.  Lymph: No lymphadenopathy of posterior or anterior cervical chain or axillae bilaterally.  Gait normal with good balance and coordination.  MSK:  Non tender with full range of motion and good stability and symmetric strength and tone of shoulders, elbows, wrist, hip, knee and ankles bilaterally.  Left foot exam shows the patient does have breakdown of the transverse arch severely.  Patient is tender to palpation with a positive squeeze test of the transverse arch.  Patient tenderness mostly over the second third and fourth metatarsals dorsally as well as on the plantar aspect.  Bunion and bunionette formation noted.  Full range of motion of the ankle noted.  Limited musculoskeletal ultrasound was performed and interpreted by Lyndal Pulley  Limited ultrasound of patient's foot shows that patient does have significant synovitis of the second, third, fourth toes as well as minorly of the fifth metatarsal joint.  No cortical defect noted.  Mild neuroma noted between the second and third metatarsal heads. Impression: Synovitis of the foot   Impression and Recommendations:     This case required medical decision making of moderate complexity. The above documentation has been reviewed and is accurate and complete Lyndal Pulley, DO       Note: This dictation was prepared with Dragon dictation along with smaller phrase technology. Any transcriptional errors that result from this process are unintentional.

## 2018-05-25 NOTE — Assessment & Plan Note (Signed)
Patient on ultrasound does have what appears to be significant synovitis of the foot.  There was a mild neuroma but I do not think it is large enough to cause significant discrepancy of pain.  Patient has not seen me previously and had more of a lumbar radiculopathy.  Negative straight leg test no noted today.  Patient also not inclined to stand she has any type of back pain.  Patient told about over-the-counter orthotics, icing regimen, prednisone, topical anti-inflammatories, vitamin D supplementation, proper shoe wear and home exercises.  Patient will try these interventions.  Patient will follow-up with me again in 4 weeks

## 2018-06-21 NOTE — Progress Notes (Signed)
[name] [address] [place], [place] [address] Phone: [phone] Subjective:     CC: Foot pain follow-up  [id number]   05/25/2018: Patient on ultrasound does have what appears to be significant synovitis of the foot.    Update 06/22/2018: Kathy King is a 63 y.o. female coming in with complaint of left foot and back pain. Patient states that she did take the prednisone. Could not tell any difference with the prednisone. Discontinued gabapentin. Is icing and doing the exercises. Got some new shoes that have helped alleviate her foot pain. Pain still occurs when barefoot. Patient feels like right foot might have synovitis as she has started to have pain in that foot.  Patient states 60% better.       Past Medical History:  Diagnosis Date  . Adenomatous colon polyp    tubular  . CHICKENPOX, HX OF   . OSTEOPENIA   . SCIATICA, LEFT 08/09/2009   s/p PT, NSAIDs  . Unspecified vitamin D deficiency    Past Surgical History:  Procedure Laterality Date  . Arthroscopic shoulder  2008  . CERVICAL FUSION  2006  . CESAREAN SECTION  88 & 93   x's 2  . TONSILLECTOMY  1965   Social History   Socioeconomic History  . Marital status: Married    Spouse name: Not on file  . Number of children: Not on file  . Years of education: Not on file  . Highest education level: Not on file  Occupational History  . Not on file  Social Needs  . Financial resource strain: Not on file  . Food insecurity:    Worry: Not on file    Inability: Not on file  . Transportation needs:    Medical: Not on file    Non-medical: Not on file  Tobacco Use  . Smoking status: Never Smoker  . Smokeless tobacco: Never Used  Substance and Sexual Activity  . Alcohol use: No    Alcohol/week: 0.0 standard drinks  . Drug use: No  . Sexual activity: Not on file  Lifestyle  . Physical activity:    Days per week: Not on file    Minutes per session: Not on file  . Stress: Not  on file  Relationships  . Social connections:    Talks on phone: Not on file    Gets together: Not on file    Attends religious service: Not on file    Active member of club or organization: Not on file    Attends meetings of clubs or organizations: Not on file    Relationship status: Not on file  Other Topics Concern  . Not on file  Social History Narrative   Married, lives with spouse and kids   Allergies  Allergen Reactions  . Sulfonamide Derivatives     REACTION: unspecified; as child   Family History  Problem Relation Age of Onset  . Memory loss Mother   . Hypertension Mother   . Hyperlipidemia Mother   . Arthritis Other        grandparent  . Breast cancer Maternal Grandmother   . Seizures Son     Current Outpatient Medications (Endocrine & Metabolic):  .  predniSONE (DELTASONE) 50 MG tablet, Take 1 tablet (50 mg total) by mouth daily.   Current Outpatient Medications (Respiratory):  .  levocetirizine (XYZAL) 5 MG tablet, Take 1 tablet (5 mg total) by mouth every evening. .  montelukast (SINGULAIR) 10 MG  tablet, Take 1 tablet (10 mg total) by mouth at bedtime.    Current Outpatient Medications (Other):  .  calcium gluconate 500 MG tablet, Take 500 mg by mouth daily.   .  Cholecalciferol (VITAMIN D) 2000 UNITS CAPS, Take by mouth.   .  Diclofenac Sodium 2 % SOLN, Place 2 g onto the skin 2 (two) times daily. Marland Kitchen  gabapentin (NEURONTIN) 100 MG capsule, Take 2 capsules (200 mg total) by mouth at bedtime. .  traZODone (DESYREL) 50 MG tablet, Take 0.5-1 tablets (25-50 mg total) by mouth at bedtime as needed for sleep.    Past medical history, social, surgical and family history all reviewed in electronic medical record.  No pertanent information unless stated regarding to the chief complaint.   Review of Systems:  No headache, visual changes, nausea, vomiting, diarrhea, constipation, dizziness, abdominal pain, skin rash, fevers, chills, night sweats, weight loss,  swollen lymph nodes, body aches, joint swelling, , chest pain, shortness of breath, mood changes.  Mild positive muscle aches  Objective  Blood pressure 120/84, pulse 79, height 5\' 4"  (1.626 m), weight 180 lb (81.6 kg), SpO2 99 %.   General: No apparent distress alert and oriented x3 mood and affect normal, dressed appropriately.  HEENT: Pupils equal, extraocular movements intact  Respiratory: Patient's speak in full sentences and does not appear short of breath  Cardiovascular: No lower extremity edema, non tender, no erythema  Skin: Warm dry intact with no signs of infection or rash on extremities or on axial skeleton.  Abdomen: Soft nontender  Neuro: Cranial nerves II through XII are intact, neurovascularly intact in all extremities with 2+ DTRs and 2+ pulses.  Lymph: No lymphadenopathy of posterior or anterior cervical chain or axillae bilaterally.  Gait normal with good balance and coordination.  MSK:  Non tender with full range of motion and good stability and symmetric strength and tone of shoulders, elbows, wrist, hip, knee and ankles bilaterally.  Foot inspection and palpation reveals breakdown of the transverse arch and a drop of MT heads: Abnormal callous is present: More of the second and fourth toes Hammer toes: 2 through 4 TTP: Over the distal aspect of the MTP joints  Limited musculoskeletal ultrasound was performed and interpreted by Lyndal Pulley  Limited ultrasound shows the patient synovitis of the toes from 22 through 5 there is improvement.  Still largest of the two third through the fifth. Pression: Interval improvement synovitis   Impression and Recommendations:     This case required medical decision making of moderate complexity. The above documentation has been reviewed and is accurate and complete Lyndal Pulley, DO       Note: This dictation was prepared with Dragon dictation along with smaller phrase technology. Any transcriptional errors that result  from this process are unintentional.

## 2018-06-22 ENCOUNTER — Ambulatory Visit: Payer: No Typology Code available for payment source | Admitting: Family Medicine

## 2018-06-22 ENCOUNTER — Encounter: Payer: Self-pay | Admitting: Family Medicine

## 2018-06-22 ENCOUNTER — Ambulatory Visit: Payer: Self-pay

## 2018-06-22 VITALS — BP 120/84 | HR 79 | Ht 64.0 in | Wt 180.0 lb

## 2018-06-22 DIAGNOSIS — M659 Synovitis and tenosynovitis, unspecified: Secondary | ICD-10-CM

## 2018-06-22 DIAGNOSIS — M79672 Pain in left foot: Secondary | ICD-10-CM

## 2018-06-22 NOTE — Assessment & Plan Note (Signed)
Synovitis of the left foot.  Patient has made some progress.  Encourage patient to continue to wear the proper shoes.  Patient declined any type of injection or formal physical therapy.  Discussed icing regimen, patient declined any type of custom bracing or custom orthotics as well.  Follow-up again in 8 weeks if no significant change.

## 2018-06-22 NOTE — Patient Instructions (Signed)
Good to see you  Ice maybe at night Kepe up iwith the good shoes and exercises See me when you need me but send a message in 1 month Tart cherry extract any dose at night

## 2018-07-12 ENCOUNTER — Telehealth: Payer: Self-pay | Admitting: *Deleted

## 2018-07-12 MED FILL — traZODone HCL 50 MG TABS: 50 | 30 days supply | Qty: 30 | Fill #0

## 2018-07-12 NOTE — Telephone Encounter (Signed)
A user error has taken place: medication ordered in error, not dispensed to this patient; has refills on file/SLS

## 2018-07-14 ENCOUNTER — Other Ambulatory Visit: Payer: Self-pay

## 2018-07-14 ENCOUNTER — Other Ambulatory Visit: Payer: No Typology Code available for payment source

## 2018-07-14 ENCOUNTER — Telehealth: Payer: Self-pay | Admitting: Family Medicine

## 2018-07-14 DIAGNOSIS — R300 Dysuria: Secondary | ICD-10-CM

## 2018-07-14 MED ORDER — CEPHALEXIN 500 MG PO CAPS: 500.0000 mg | ORAL_CAPSULE | Freq: Two times a day (BID) | ORAL | 0 refills | Status: DC

## 2018-07-14 MED FILL — CEPHALEXIN 500 MG CAPSULE: 500 | 7 days supply | Qty: 14 | Fill #0

## 2018-07-14 NOTE — Addendum Note (Signed)
Addended by: Kelle Darting A on: 07/14/2018 10:41 AM   Modules accepted: Orders

## 2018-07-14 NOTE — Telephone Encounter (Signed)
Patient notes dysuria since yesterday morning.  No hematuria We will send her urine for culture, UA is normal We will start presumptive treatment at this time with keflex- she will alert me if not feeing better in the next couple of days- Sooner if worse.

## 2018-07-16 ENCOUNTER — Encounter: Payer: Self-pay | Admitting: Family Medicine

## 2018-07-16 LAB — URINE CULTURE
MICRO NUMBER:: 332655
SPECIMEN QUALITY:: ADEQUATE

## 2018-07-30 MED FILL — LEVOCETIRIZINE 5 MG TABLET: 5 | 30 days supply | Qty: 30 | Fill #1

## 2018-07-30 MED FILL — MONTELUKAST SOD 10 MG TAB: 10 | 30 days supply | Qty: 30 | Fill #1

## 2018-09-01 ENCOUNTER — Encounter: Payer: Self-pay | Admitting: Family Medicine

## 2018-09-01 ENCOUNTER — Other Ambulatory Visit: Payer: Self-pay

## 2018-09-01 ENCOUNTER — Ambulatory Visit (INDEPENDENT_AMBULATORY_CARE_PROVIDER_SITE_OTHER): Payer: No Typology Code available for payment source | Admitting: Family Medicine

## 2018-09-01 VITALS — BP 125/82 | HR 67 | Temp 98.3°F | Resp 16 | Ht 64.0 in | Wt 180.0 lb

## 2018-09-01 DIAGNOSIS — M7062 Trochanteric bursitis, left hip: Secondary | ICD-10-CM

## 2018-09-01 MED ORDER — METHYLPREDNISOLONE ACETATE 40 MG/ML IJ SUSP
40.0000 mg | Freq: Once | INTRAMUSCULAR | Status: AC
  Administered 2018-09-01: 40 mg via INTRAMUSCULAR

## 2018-09-01 NOTE — Progress Notes (Signed)
Musculoskeletal Exam  Patient: Kathy King DOB: 1956-04-27  DOS: 09/01/2018  SUBJECTIVE:  Chief Complaint:   CC: Hip pain  Kathy King is a 63 y.o.  female for evaluation and treatment of L hip pain pain.   Onset:  2 days ago. More active in yard lately.  Location: L outer hip Character:  aching and sharp  Progression of issue:  is unchanged Associated symptoms: pain w ambulation Treatment: to date has been rest.   +hx of bursitis, requesting injection  ROS: Musculoskeletal/Extremities: +L hip pain  Past Medical History:  Diagnosis Date  . Adenomatous colon polyp    tubular  . CHICKENPOX, HX OF   . OSTEOPENIA   . SCIATICA, LEFT 08/09/2009   s/p PT, NSAIDs  . Unspecified vitamin D deficiency     Objective: VITAL SIGNS: BP 125/82   Pulse 67   Temp 98.3 F (36.8 C) (Oral)   Resp 16   Ht 5\' 4"  (1.626 m)   Wt 180 lb (81.6 kg)   SpO2 100%   BMI 30.90 kg/m  Constitutional: Well formed, well developed. No acute distress. Thorax & Lungs: No accessory muscle use Musculoskeletal: L hip.   +TTP over greater troch Neurologic: Normal sensory function. No focal deficits noted. Psychiatric: Normal mood. Age appropriate judgment and insight. Alert & oriented x 3.    Procedure note:  L Greater trochanteric bursa injection Verbal consent obtained. The area of interest was palpated and demarcated with an otoscope speculum. It was cleaned with an alcohol swab. Freeze spray was used. A 27 g needle was inserted at a perpendicular angle through the area of interested. The plunger was withdrawn to ensure our placement was not in a vessel. 2 mL of 1% lidocaine without epi and 40 mg of Depomedrol was injected. A bandaid was placed. The patient tolerated the procedure well.  There were no complications noted.  Procedure performed w presence of female chaperone.   Assessment:  Greater trochanteric bursitis of left hip - Plan: methylPREDNISolone acetate (DEPO-MEDROL) injection  40 mg, PR DRAIN/INJECT LARGE JOINT/BURSA  Plan: Orders as above. Stretches/exercises. Needs to stretch IT band. F/u prn. The patient voiced understanding and agreement to the plan.   [place], DO 09/01/18  11:57 AM

## 2018-09-01 NOTE — Patient Instructions (Addendum)
Iliotibial Band Syndrome Rehab It is normal to feel mild stretching, pulling, tightness, or discomfort as you do these exercises, but you should stop right away if you feel sudden pain or your pain gets worse.  Stretching and range of motion exercises These exercises warm up your muscles and joints and improve the movement and flexibility of your hip and pelvis. Exercise A: Quadriceps, prone    1. Lie on your abdomen on a firm surface, such as a bed or padded floor. 2. Bend your left / right knee and hold your ankle. If you cannot reach your ankle or pant leg, loop a belt around your foot and grab the belt instead. 3. Gently pull your heel toward your buttocks. Your knee should not slide out to the side. You should feel a stretch in the front of your thigh and knee. 4. Hold this position for 30 seconds. Repeat 2 times. Complete this stretch 3 times per week. Exercise B: Iliotibial band    1. Lie on your side with your left / right leg in the top position. 2. Bend both of your knees and grab your left / right ankle. Stretch out your bottom arm to help you balance. 3. Slowly bring your top knee back so your thigh goes behind your trunk. 4. Slowly lower your top leg toward the floor until you feel a gentle stretch on the outside of your left / right hip and thigh. If you do not feel a stretch and your knee will not fall farther, place the heel of your other foot on top of your knee and pull your knee down toward the floor with your foot. 5. Hold this position for 30 seconds. Repeat 2 times. Complete this stretch 3 times per week. Strengthening exercises These exercises build strength and endurance in your hip and pelvis. Endurance is the ability to use your muscles for a long time, even after they get tired. Exercise C: Straight leg raises (hip abductors)     1. Lie on your side with your left / right leg in the top position. Lie so your head, shoulder, knee, and hip line up. You may bend your  bottom knee to help you balance. 2. Roll your hips slightly forward so your hips are stacked directly over each other and your left / right knee is facing forward. 3. Tense the muscles in your outer thigh and lift your top leg 4-6 inches (10-15 cm). 4. Hold this position for 3 seconds. Repeat for a total of 10 reps. 5. Slowly return to the starting position. Let your muscles relax completely before doing another repetition. Repeat 2 times. Complete this exercise 3 times per week. Exercise D: Straight leg raises (hip extensors) 1. Lie on your abdomen on your bed or a firm surface. You can put a pillow under your hips if that is more comfortable. 2. Bend your left / right knee so your foot is straight up in the air. 3. Squeeze your buttock muscles and lift your left / right thigh off the bed. Do not let your back arch. 4. Tense this muscle as hard as you can without increasing any knee pain. 5. Hold this position for 2 seconds. Repeat for a total of 10 reps 6. Slowly lower your leg to the starting position and allow it to relax completely. Repeat 2 times. Complete this exercise 3 times per week. Exercise E: Hip hike 1. Stand sideways on a bottom step. Stand on your left / right leg with  your other foot unsupported next to the step. You can hold onto the railing or wall if needed for balance. 2. Keep your knees straight and your torso square. Then, lift your left / right hip up toward the ceiling. 3. Slowly let your left / right hip lower toward the floor, past the starting position. Your foot should get closer to the floor. Do not lean or bend your knees. Repeat 2 times. Complete this exercise 3 times per week.  Document Released: 04/14/2005 Document Revised: 12/18/2015 Document Reviewed: 03/16/2015 Elsevier Interactive Patient Education  [employer].

## 2018-09-02 ENCOUNTER — Other Ambulatory Visit: Payer: Self-pay | Admitting: Family Medicine

## 2018-09-02 DIAGNOSIS — M5442 Lumbago with sciatica, left side: Secondary | ICD-10-CM

## 2018-09-02 DIAGNOSIS — M545 Low back pain: Principal | ICD-10-CM

## 2018-09-02 DIAGNOSIS — G8929 Other chronic pain: Secondary | ICD-10-CM

## 2018-09-03 ENCOUNTER — Other Ambulatory Visit: Payer: Self-pay

## 2018-09-03 MED FILL — CYCLOBENZAPRINE HCL 10 MG T: 10 | 15 days supply | Qty: 30 | Fill #0

## 2018-09-03 MED FILL — MELOXICAM 15 MG TABLET: 15 | 90 days supply | Qty: 90 | Fill #0

## 2018-09-24 MED FILL — MONTELUKAST SOD 10 MG TAB: 10 | 30 days supply | Qty: 30 | Fill #2

## 2018-10-20 MED FILL — CYCLOBENZAPRINE HCL 10 MG T: 10 | 15 days supply | Qty: 30 | Fill #1

## 2018-12-31 ENCOUNTER — Other Ambulatory Visit: Payer: Self-pay

## 2018-12-31 MED ORDER — MONTELUKAST SODIUM 10 MG PO TABS: 10.0000 mg | ORAL_TABLET | Freq: Every day | ORAL | 1 refills | Status: DC

## 2018-12-31 MED FILL — MONTELUKAST SOD 10 MG TAB: 10 | 90 days supply | Qty: 90 | Fill #0

## 2019-01-04 ENCOUNTER — Other Ambulatory Visit: Payer: Self-pay | Admitting: Family Medicine

## 2019-01-04 DIAGNOSIS — Z1231 Encounter for screening mammogram for malignant neoplasm of breast: Secondary | ICD-10-CM

## 2019-02-09 NOTE — Progress Notes (Addendum)
[place] [facility] [address], [place], [place] [address] [phone]  Date:  02/10/2019   Name:  Kathy King   DOB:  1955-08-08   MRN:  [id number]  PCP:  Darreld Mclean, MD    Chief Complaint: Annual Exam   History of Present Illness:  Kathy King is a 63 y.o. very pleasant female patient who presents with the following:  Here today for routine physical History of osteopenia, cough variant asthma, lumbar radiculopathy and sciatica, vitamin D deficiency  Flu shot- done yesterday  Pap 2018-negative with negative HPV Mammogram scheduled this month-DEXA scan 2 years ago, osteopenia.  May wish to repeat this year with mammogram Colonoscopy up-to-date Shingrix complete Due for routine labs- she ate some cereal this am   She still suffers from hot flashes-stable No PMB No CP or SOB She is very active at her job- she does get somewhat exhausted by the end of the day, she notes this more so as the years go on  Her husband does notice that she snores- he has not noted apnea For the time being she declines a sleep study but might think about this later  BP Readings from Last 3 Encounters:  02/10/19 126/80  09/01/18 125/82  06/22/18 120/84    I saw her in January with foot pain-referred to see Dr. Gardenia Phlegm because she had synovitis and a mild neuroma Her feet are doing significantly better with the change in footwear  One of her daughters is expecting a grandchild in February  Wt Readings from Last 3 Encounters:  02/10/19 185 lb (83.9 kg)  09/01/18 180 lb (81.6 kg)  06/22/18 180 lb (81.6 kg)    Patient Active Problem List   Diagnosis Date Noted  . Synovitis of left foot 05/25/2018  . Low back pain 12/12/2016  . Vitamin D deficiency   . Cough variant asthma   . Lumbar radiculopathy 10/22/2011  . SCIATICA, LEFT 08/09/2009  . Disorder of bone and cartilage 08/09/2009    Past Medical History:   Diagnosis Date  . Adenomatous colon polyp    tubular  . CHICKENPOX, HX OF   . OSTEOPENIA   . SCIATICA, LEFT 08/09/2009   s/p PT, NSAIDs  . Unspecified vitamin D deficiency     Past Surgical History:  Procedure Laterality Date  . Arthroscopic shoulder  2008  . CERVICAL FUSION  2006  . CESAREAN SECTION  88 & 93   x's 2  . TONSILLECTOMY  1965    Social History   Tobacco Use  . Smoking status: Never Smoker  . Smokeless tobacco: Never Used  Substance Use Topics  . Alcohol use: No    Alcohol/week: 0.0 standard drinks  . Drug use: No    Family History  Problem Relation Age of Onset  . Memory loss Mother   . Hypertension Mother   . Hyperlipidemia Mother   . Arthritis Other        grandparent  . Breast cancer Maternal Grandmother   . Seizures Son     Allergies  Allergen Reactions  . Sulfonamide Derivatives     REACTION: unspecified; as child    Medication list has been reviewed and updated.  Current Outpatient Medications on File Prior to Visit  Medication Sig Dispense Refill  . calcium gluconate 500 MG tablet Take 500 mg by mouth daily.      . Cholecalciferol (VITAMIN D) 2000 UNITS CAPS  Take by mouth.      . cyclobenzaprine (FLEXERIL) 10 MG tablet TAKE 1 TABLET BY MOUTH TWICE DAILY AS NEEDED FOR MUSCLE SPASMS 30 tablet 1  . Diclofenac Sodium 2 % SOLN Place 2 g onto the skin 2 (two) times daily. 112 g 3  . gabapentin (NEURONTIN) 100 MG capsule Take 2 capsules (200 mg total) by mouth at bedtime. 60 capsule 3  . levocetirizine (XYZAL) 5 MG tablet Take 1 tablet (5 mg total) by mouth every evening. 90 tablet 1  . meloxicam (MOBIC) 15 MG tablet TAKE 1 TABLET (15 MG TOTAL) BY MOUTH DAILY. 90 tablet 1  . montelukast (SINGULAIR) 10 MG tablet Take 1 tablet (10 mg total) by mouth at bedtime. 90 tablet 1  . traZODone (DESYREL) 50 MG tablet Take 0.5-1 tablets (25-50 mg total) by mouth at bedtime as needed for sleep. 30 tablet 6   No current facility-administered medications  on file prior to visit.     Review of Systems:  As per HPI- otherwise negative.  No fever or chills Physical Examination: Vitals:   02/10/19 1120  BP: 126/80  Pulse: 81  Resp: 16  Temp: (!) 96.8 F (36 C)  SpO2: 100%   Vitals:   02/10/19 1120  Weight: 185 lb (83.9 kg)  Height: 5\' 4"  (1.626 m)   Body mass index is 31.76 kg/m. Ideal Body Weight: Weight in (lb) to have BMI = 25: 145.3  GEN: WDWN, NAD, Non-toxic, A & O x 3, overweight, looks well  HEENT: Atraumatic, Normocephalic. Neck supple. No masses, No LAD.  TM wnl bilaterally . Ears and Nose: No external deformity. CV: RRR, No M/G/R. No JVD. No thrill. No extra heart sounds. PULM: CTA B, no wheezes, crackles, rhonchi. No retractions. No resp. distress. No accessory muscle use. ABD: S, NT, ND, +BS. No rebound. No HSM. EXTR: No c/c/e NEURO Normal gait.  PSYCH: Normally interactive. Conversant. Not depressed or anxious appearing.  Calm demeanor.    Assessment and Plan: Physical exam  Screening for diabetes mellitus - Plan: Comprehensive metabolic panel, Hemoglobin A1c  Screening for deficiency anemia - Plan: CBC  Screening for hyperlipidemia - Plan: Lipid panel  Disorder of bone and cartilage  Vitamin D deficiency - Plan: Vitamin D (25 hydroxy)  Screening for thyroid disorder - Plan: TSH  Estrogen deficiency - Plan: DG Bone Density  CPE today Ordered DEXA scan which she will schedule with her mammogram Will plan further follow- up pending labs. Immunizations up-to-date  Signed Lamar Blinks, MD  Received her labs, message to pt Results for orders placed or performed in visit on 02/10/19  CBC  Result Value Ref Range   WBC 5.9 4.0 - 10.5 K/uL   RBC 4.30 3.87 - 5.11 Mil/uL   Platelets 218.0 150.0 - 400.0 K/uL   Hemoglobin 12.7 12.0 - 15.0 g/dL   HCT 38.6 36.0 - 46.0 %   MCV 89.9 78.0 - 100.0 fl   MCHC 32.8 30.0 - 36.0 g/dL   RDW 12.8 11.5 - 15.5 %  Comprehensive metabolic panel  Result Value  Ref Range   Sodium 140 135 - 145 mEq/L   Potassium 4.2 3.5 - 5.1 mEq/L   Chloride 104 96 - 112 mEq/L   CO2 29 19 - 32 mEq/L   Glucose, Bld 85 70 - 99 mg/dL   BUN 15 6 - 23 mg/dL   Creatinine, Ser 0.86 0.40 - 1.20 mg/dL   Total Bilirubin 0.5 0.2 - 1.2 mg/dL   Alkaline  Phosphatase 74 39 - 117 U/L   AST 24 0 - 37 U/L   ALT 29 0 - 35 U/L   Total Protein 6.1 6.0 - 8.3 g/dL   Albumin 4.4 3.5 - 5.2 g/dL   Calcium 9.5 8.4 - 10.5 mg/dL   GFR 67.12 >60.00 mL/min  Hemoglobin A1c  Result Value Ref Range   Hgb A1c MFr Bld 5.6 4.6 - 6.5 %  Lipid panel  Result Value Ref Range   Cholesterol 203 (H) 0 - 200 mg/dL   Triglycerides 69.0 0.0 - 149.0 mg/dL   HDL 69.60 >39.00 mg/dL   VLDL 13.8 0.0 - 40.0 mg/dL   LDL Cholesterol 120 (H) 0 - 99 mg/dL   Total CHOL/HDL Ratio 3    NonHDL 133.63   TSH  Result Value Ref Range   TSH 2.09 0.35 - 4.50 uIU/mL  Vitamin D (25 hydroxy)  Result Value Ref Range   VITD 45.78 30.00 - 100.00 ng/mL

## 2019-02-09 NOTE — Patient Instructions (Addendum)
It was great to see you again today, I will be in touch with your labs ASAP I ordered a dexa scan for you to do at the same time as your mammogram Let me know if you want a sleep study at some point     Health Maintenance, Female Adopting a healthy lifestyle and getting preventive care are important in promoting health and wellness. Ask your health care provider about:  The right schedule for you to have regular tests and exams.  Things you can do on your own to prevent diseases and keep yourself healthy. What should I know about diet, weight, and exercise? Eat a healthy diet   Eat a diet that includes plenty of vegetables, fruits, low-fat dairy products, and lean protein.  Do not eat a lot of foods that are high in solid fats, added sugars, or sodium. Maintain a healthy weight Body mass index (BMI) is used to identify weight problems. It estimates body fat based on height and weight. Your health care provider can help determine your BMI and help you achieve or maintain a healthy weight. Get regular exercise Get regular exercise. This is one of the most important things you can do for your health. Most adults should:  Exercise for at least 150 minutes each week. The exercise should increase your heart rate and make you sweat (moderate-intensity exercise).  Do strengthening exercises at least twice a week. This is in addition to the moderate-intensity exercise.  Spend less time sitting. Even light physical activity can be beneficial. Watch cholesterol and blood lipids Have your blood tested for lipids and cholesterol at 61 years of age, then have this test every 5 years. Have your cholesterol levels checked more often if:  Your lipid or cholesterol levels are high.  You are older than 61 years of age.  You are at high risk for heart disease. What should I know about cancer screening? Depending on your health history and family history, you may need to have cancer screening at  various ages. This may include screening for:  Breast cancer.  Cervical cancer.  Colorectal cancer.  Skin cancer.  Lung cancer. What should I know about heart disease, diabetes, and high blood pressure? Blood pressure and heart disease  High blood pressure causes heart disease and increases the risk of stroke. This is more likely to develop in people who have high blood pressure readings, are of African descent, or are overweight.  Have your blood pressure checked: ? Every 3-5 years if you are 68-109 years of age. ? Every year if you are 71 years old or older. Diabetes Have regular diabetes screenings. This checks your fasting blood sugar level. Have the screening done:  Once every three years after age 90 if you are at a normal weight and have a low risk for diabetes.  More often and at a younger age if you are overweight or have a high risk for diabetes. What should I know about preventing infection? Hepatitis B If you have a higher risk for hepatitis B, you should be screened for this virus. Talk with your health care provider to find out if you are at risk for hepatitis B infection. Hepatitis C Testing is recommended for:  Everyone born from [birth year] through 1963.  Anyone with known risk factors for hepatitis C. Sexually transmitted infections (STIs)  Get screened for STIs, including gonorrhea and chlamydia, if: ? You are sexually active and are younger than 61 years of age. ? You are older  than 61 years of age and your health care provider tells you that you are at risk for this type of infection. ? Your sexual activity has changed since you were last screened, and you are at increased risk for chlamydia or gonorrhea. Ask your health care provider if you are at risk.  Ask your health care provider about whether you are at high risk for HIV. Your health care provider may recommend a prescription medicine to help prevent HIV infection. If you choose to take medicine to prevent  HIV, you should first get tested for HIV. You should then be tested every 3 months for as long as you are taking the medicine. Pregnancy  If you are about to stop having your period (premenopausal) and you may become pregnant, seek counseling before you get pregnant.  Take 400 to 800 micrograms (mcg) of folic acid every day if you become pregnant.  Ask for birth control (contraception) if you want to prevent pregnancy. Osteoporosis and menopause Osteoporosis is a disease in which the bones lose minerals and strength with aging. This can result in bone fractures. If you are 79 years old or older, or if you are at risk for osteoporosis and fractures, ask your health care provider if you should:  Be screened for bone loss.  Take a calcium or vitamin D supplement to lower your risk of fractures.  Be given hormone replacement therapy (HRT) to treat symptoms of menopause. Follow these instructions at home: Lifestyle  Do not use any products that contain nicotine or tobacco, such as cigarettes, e-cigarettes, and chewing tobacco. If you need help quitting, ask your health care provider.  Do not use street drugs.  Do not share needles.  Ask your health care provider for help if you need support or information about quitting drugs. Alcohol use  Do not drink alcohol if: ? Your health care provider tells you not to drink. ? You are pregnant, may be pregnant, or are planning to become pregnant.  If you drink alcohol: ? Limit how much you use to 0-1 drink a day. ? Limit intake if you are breastfeeding.  Be aware of how much alcohol is in your drink. In the [place] one drink equals one 12 oz bottle of beer (355 mL), one 5 oz glass of wine (148 mL), or one 1 oz glass of hard liquor (44 mL). General instructions  Schedule regular health, dental, and eye exams.  Stay current with your vaccines.  Tell your health care provider if: ? You often feel depressed. ? You have ever been abused or do  not feel safe at home. Summary  Adopting a healthy lifestyle and getting preventive care are important in promoting health and wellness.  Follow your health care provider's instructions about healthy diet, exercising, and getting tested or screened for diseases.  Follow your health care provider's instructions on monitoring your cholesterol and blood pressure. This information is not intended to replace advice given to you by your health care provider. Make sure you discuss any questions you have with your health care provider. Document Released: 10/28/2010 Document Revised: 04/07/2018 Document Reviewed: 04/07/2018 Elsevier Patient Education  2020 [employer].

## 2019-02-10 ENCOUNTER — Other Ambulatory Visit: Payer: Self-pay

## 2019-02-10 ENCOUNTER — Encounter: Payer: Self-pay | Admitting: Family Medicine

## 2019-02-10 ENCOUNTER — Ambulatory Visit (INDEPENDENT_AMBULATORY_CARE_PROVIDER_SITE_OTHER): Payer: No Typology Code available for payment source | Admitting: Family Medicine

## 2019-02-10 VITALS — BP 126/80 | HR 81 | Temp 96.8°F | Resp 16 | Ht 64.0 in | Wt 185.0 lb

## 2019-02-10 DIAGNOSIS — E2839 Other primary ovarian failure: Secondary | ICD-10-CM

## 2019-02-10 DIAGNOSIS — M949 Disorder of cartilage, unspecified: Secondary | ICD-10-CM

## 2019-02-10 DIAGNOSIS — Z130 Encounter for screening for diseases of the blood and blood-forming organs and certain disorders involving the immune mechanism: Secondary | ICD-10-CM

## 2019-02-10 DIAGNOSIS — Z13220 Encounter for screening for lipoid disorders: Secondary | ICD-10-CM

## 2019-02-10 DIAGNOSIS — Z131 Encounter for screening for diabetes mellitus: Secondary | ICD-10-CM | POA: Diagnosis not present

## 2019-02-10 DIAGNOSIS — Z1329 Encounter for screening for other suspected endocrine disorder: Secondary | ICD-10-CM

## 2019-02-10 DIAGNOSIS — Z0000 Encounter for general adult medical examination without abnormal findings: Secondary | ICD-10-CM

## 2019-02-10 DIAGNOSIS — E559 Vitamin D deficiency, unspecified: Secondary | ICD-10-CM

## 2019-02-10 DIAGNOSIS — M899 Disorder of bone, unspecified: Secondary | ICD-10-CM

## 2019-02-10 LAB — LIPID PANEL
Cholesterol: 203 mg/dL — ABNORMAL HIGH (ref 0–200)
HDL: 69.6 mg/dL (ref >39.00)
LDL Cholesterol: 120 mg/dL — ABNORMAL HIGH (ref 0–99)
NonHDL: 133.63
Total CHOL/HDL Ratio: 3
Triglycerides: 69 mg/dL (ref 0.0–149.0)
VLDL: 13.8 mg/dL (ref 0.0–40.0)

## 2019-02-10 LAB — COMPREHENSIVE METABOLIC PANEL
ALT: 29 U/L (ref 0–35)
AST: 24 U/L (ref 0–37)
Albumin: 4.4 g/dL (ref 3.5–5.2)
Alkaline Phosphatase: 74 U/L (ref 39–117)
BUN: 15 mg/dL (ref 6–23)
CO2: 29 mEq/L (ref 19–32)
Calcium: 9.5 mg/dL (ref 8.4–10.5)
Chloride: 104 mEq/L (ref 96–112)
Creatinine, Ser: 0.86 mg/dL (ref 0.40–1.20)
GFR: 67.12 mL/min (ref >60.00)
Glucose, Bld: 85 mg/dL (ref 70–99)
Potassium: 4.2 mEq/L (ref 3.5–5.1)
Sodium: 140 mEq/L (ref 135–145)
Total Bilirubin: 0.5 mg/dL (ref 0.2–1.2)
Total Protein: 6.1 g/dL (ref 6.0–8.3)

## 2019-02-10 LAB — HEMOGLOBIN A1C: Hgb A1c MFr Bld: 5.6 % (ref 4.6–6.5)

## 2019-02-10 LAB — CBC
HCT: 38.6 % (ref 36.0–46.0)
Hemoglobin: 12.7 g/dL (ref 12.0–15.0)
MCHC: 32.8 g/dL (ref 30.0–36.0)
MCV: 89.9 fl (ref 78.0–100.0)
Platelets: 218 10*3/uL (ref 150.0–400.0)
RBC: 4.3 Mil/uL (ref 3.87–5.11)
RDW: 12.8 % (ref 11.5–15.5)
WBC: 5.9 10*3/uL (ref 4.0–10.5)

## 2019-02-10 LAB — VITAMIN D 25 HYDROXY (VIT D DEFICIENCY, FRACTURES): VITD: 45.78 ng/mL (ref 30.00–100.00)

## 2019-02-10 LAB — TSH: TSH: 2.09 u[IU]/mL (ref 0.35–4.50)

## 2019-02-10 NOTE — Addendum Note (Signed)
Addended by: Wynonia Musty A on: 02/10/2019 01:25 PM   Modules accepted: Orders

## 2019-02-17 ENCOUNTER — Other Ambulatory Visit: Payer: Self-pay

## 2019-02-17 ENCOUNTER — Ambulatory Visit
Admission: RE | Admit: 2019-02-17 | Discharge: 2019-02-17 | Disposition: A | Discharge status: Home / Self Care | Payer: No Typology Code available for payment source | Source: Ambulatory Visit | Attending: Family Medicine | Admitting: Family Medicine

## 2019-02-17 DIAGNOSIS — Z1231 Encounter for screening mammogram for malignant neoplasm of breast: Secondary | ICD-10-CM

## 2019-02-18 IMAGING — DX DG LUMBAR SPINE COMPLETE 4+V
5 series · 5 of 5 positions shown · non-contrast
Comparison: 01/15/2010

CLINICAL DATA: Low back pain

EXAM:
LUMBAR SPINE - COMPLETE 4+ VIEW

[l-spine ap]
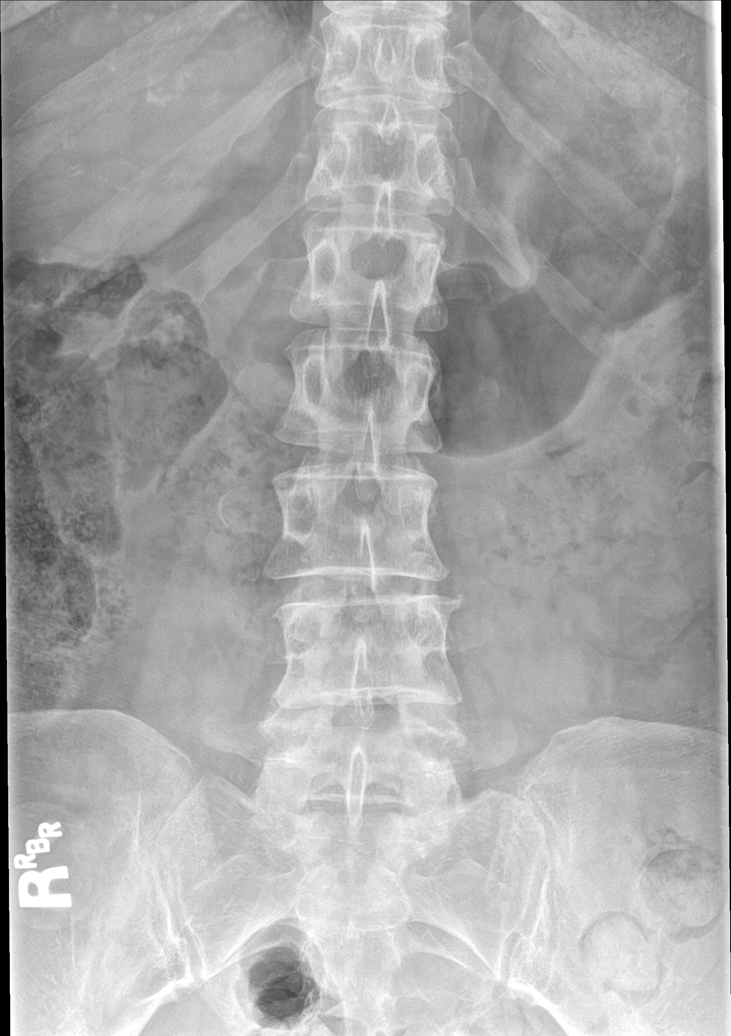

[l-spine obl (1 of 2)]
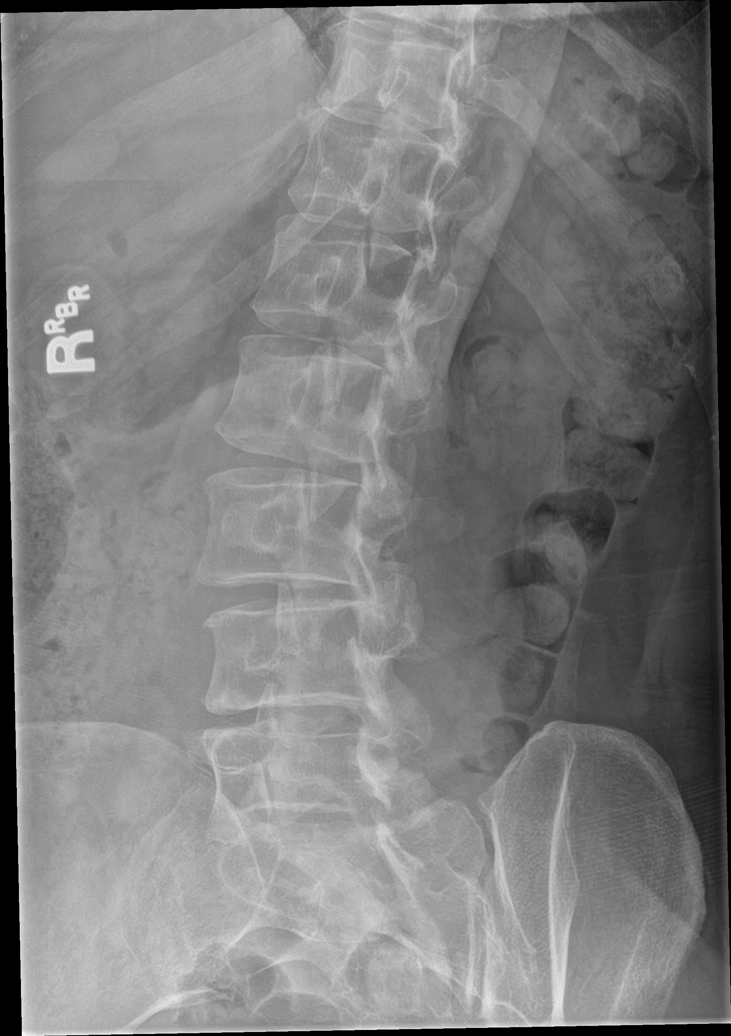

[l-spine obl (2 of 2)]
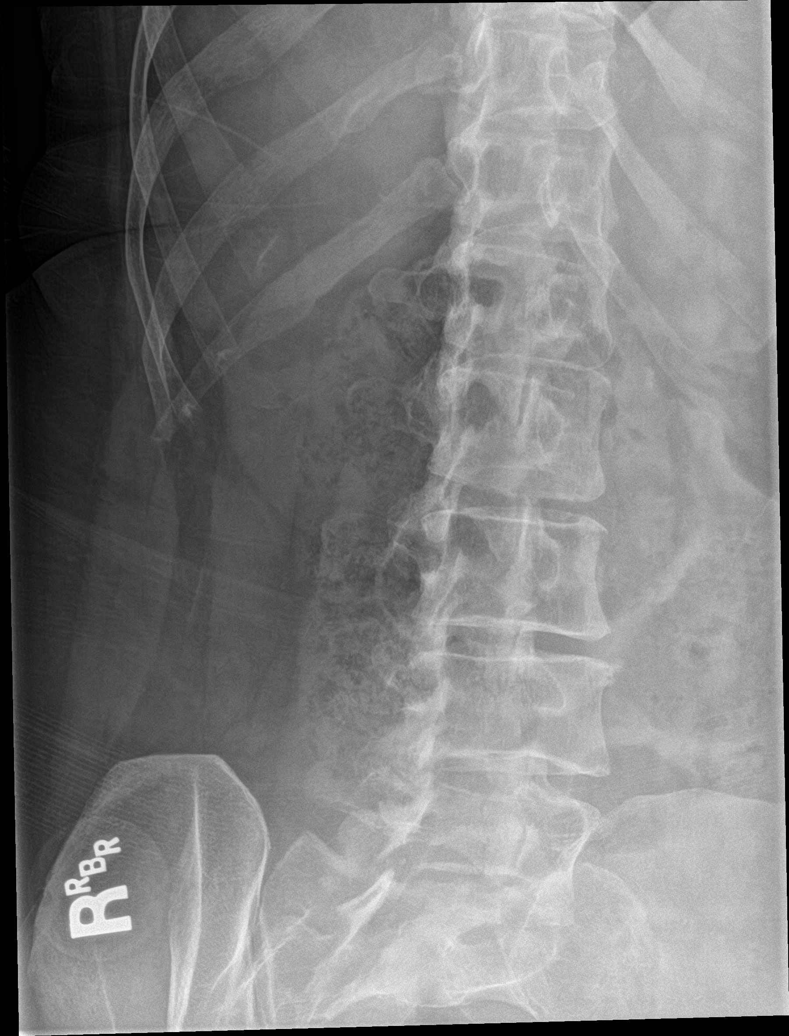

[l-spine lat]
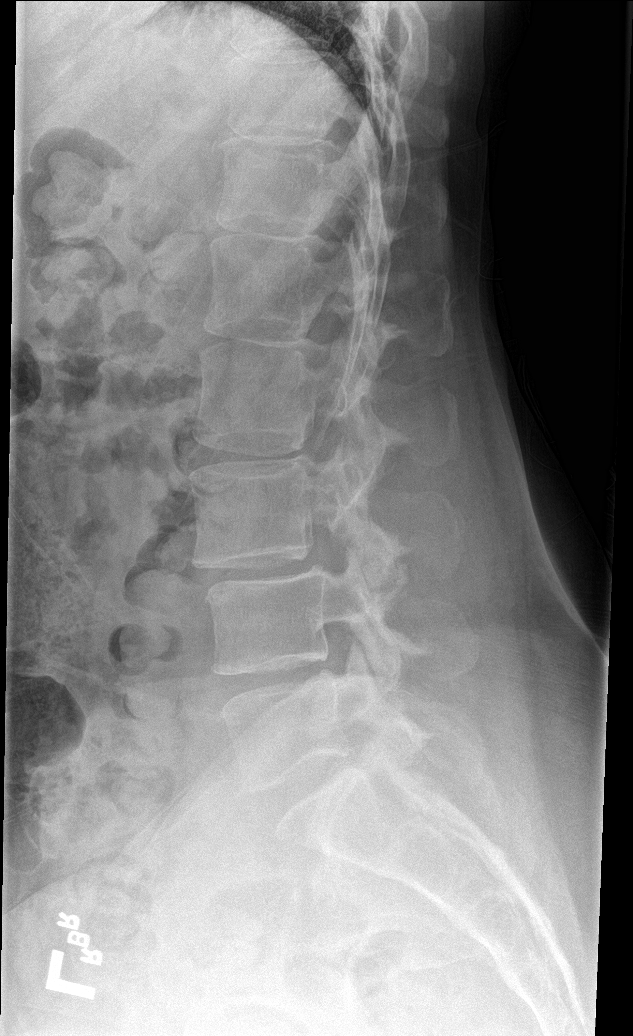

[l-spine spot]
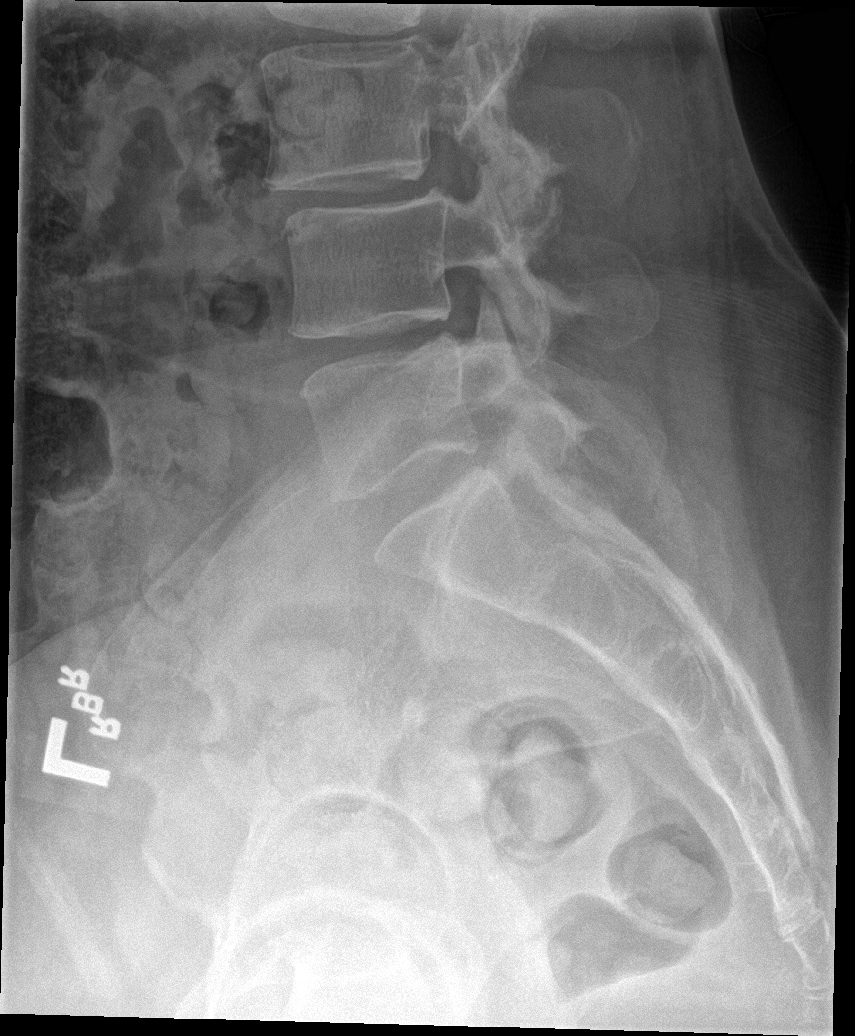

[5 of 5 positions shown; findings below may reference images not displayed]

FINDINGS: Minimal scoliosis of the lumbar spine, apex right. Grade 1
anterolisthesis of L3 on L4. Vertebral body heights are normal. Mild
degenerative disc changes at L3-L4 and L4-L5.
IMPRESSION: Mild degenerative changes.  No acute osseous abnormality.

## 2019-02-21 ENCOUNTER — Encounter: Payer: Self-pay | Admitting: Family Medicine

## 2019-02-21 ENCOUNTER — Ambulatory Visit (INDEPENDENT_AMBULATORY_CARE_PROVIDER_SITE_OTHER): Payer: No Typology Code available for payment source | Admitting: Family Medicine

## 2019-02-21 ENCOUNTER — Other Ambulatory Visit: Payer: Self-pay

## 2019-02-21 VITALS — BP 122/68 | HR 71 | Temp 98.1°F | Resp 16 | Ht 64.0 in | Wt 185.0 lb

## 2019-02-21 DIAGNOSIS — M62838 Other muscle spasm: Secondary | ICD-10-CM

## 2019-02-21 MED ORDER — TRAMADOL HCL 50 MG PO TABS: 50.0000 mg | ORAL_TABLET | Freq: Three times a day (TID) | ORAL | 0 refills | Status: AC | PRN

## 2019-02-21 MED ORDER — PREDNISONE 20 MG PO TABS: ORAL_TABLET | ORAL | 0 refills | Status: DC

## 2019-02-21 MED ORDER — CYCLOBENZAPRINE HCL 10 MG PO TABS: 10.0000 mg | ORAL_TABLET | Freq: Two times a day (BID) | ORAL | 0 refills | Status: DC | PRN

## 2019-02-21 MED FILL — traMADol HCL 50 MG TABS: 50 | 5 days supply | Qty: 15 | Fill #0

## 2019-02-21 MED FILL — predniSONE 20 MG TABS: 20 | 10 days supply | Qty: 15 | Fill #0

## 2019-02-21 MED FILL — CYCLOBENZAPRINE HCL 10 MG T: 10 | 15 days supply | Qty: 30 | Fill #0

## 2019-02-21 NOTE — Patient Instructions (Signed)
Good to see you today but I am sorry that your back is hurting!  I suspect you are having some compression of the sciatic nerve, perhaps from the piriformis muscle  Oral prednisone for 10 days- avoid taking later than dinner time While on prednisone avoid NSAID like ibuprofen or aleve- tylenol is ok  Flexeril and/ or tramadol as needed Avoid combination with trazodone Both flexeril and tramadol can cause drowsiness- avoid using them together or when driving  Please let me know if not feeling better in the next few days If sx return or do not go away consider ortho referral

## 2019-02-21 NOTE — Progress Notes (Signed)
Kathy King at [facility] [address] [place], [place] [address] [phone]  Date:  02/21/2019   Name:  Kathy King   DOB:  06/01/1955   MRN:  [id number]  PCP:  Darreld Mclean, MD    Chief Complaint: Hip Pain   History of Present Illness:  Kathy King is a 63 y.o. very pleasant female patient who presents with the following:  Seen by myself recently for a CPE Here today with concern of left hip pain She had an injection for LEFT greater trochanteric bursitis in May of this year with 2 mL of 1% lidocaine without epi and 40 mg of Depomedrol was injected.  She has noted left hip pain again for the last week or so, gotten worse over the last several days The right side is ok She may get some "twingy" feeling down her left leg at times, and sometimes her right toes get numb-intermittently No saddle anesthesia or bowel or bladder incontinence  She did have some degen change in her lumbar spine, films 2018:  FINDINGS: Minimal scoliosis of the lumbar spine, apex right. Grade 1 anterolisthesis of L3 on L4. Vertebral body heights are normal. Mild degenerative disc changes at L3-L4 and L4-L5.  IMPRESSION: Mild degenerative changes.  No acute osseous abnormality.   She has used flexeril that she had on hand the last couple of nights Using ibuprofen during the day No meds today   Over the weekend she did certain jobs around the house, including cleaning out under the washing machine and planting some bushes in her yard.  Bending down and then having to get back up seems to exacerbate her symptoms She notes that trying to bend forward right now is very painful, she feels a pulling sensation down her left buttock and leg No acute injury     Patient Active Problem List   Diagnosis Date Noted  . Synovitis of left foot 05/25/2018  . Low back pain 12/12/2016  . Vitamin D deficiency   . Cough variant asthma   . Lumbar  radiculopathy 10/22/2011  . SCIATICA, LEFT 08/09/2009  . Disorder of bone and cartilage 08/09/2009    Past Medical History:  Diagnosis Date  . Adenomatous colon polyp    tubular  . CHICKENPOX, HX OF   . OSTEOPENIA   . SCIATICA, LEFT 08/09/2009   s/p PT, NSAIDs  . Unspecified vitamin D deficiency     Past Surgical History:  Procedure Laterality Date  . Arthroscopic shoulder  2008  . CERVICAL FUSION  2006  . CESAREAN SECTION  88 & 93   x's 2  . TONSILLECTOMY  1965    Social History   Tobacco Use  . Smoking status: Never Smoker  . Smokeless tobacco: Never Used  Substance Use Topics  . Alcohol use: No    Alcohol/week: 0.0 standard drinks  . Drug use: No    Family History  Problem Relation Age of Onset  . Memory loss Mother   . Hypertension Mother   . Hyperlipidemia Mother   . Arthritis Other        grandparent  . Breast cancer Maternal Grandmother   . Seizures Son     Allergies  Allergen Reactions  . Sulfonamide Derivatives     REACTION: unspecified; as child    Medication list has been reviewed and updated.  Current Outpatient Medications on File Prior to Visit  Medication Sig Dispense Refill  .  calcium gluconate 500 MG tablet Take 500 mg by mouth daily.      . Cholecalciferol (VITAMIN D) 2000 UNITS CAPS Take by mouth.      . levocetirizine (XYZAL) 5 MG tablet Take 1 tablet (5 mg total) by mouth every evening. 90 tablet 1  . meloxicam (MOBIC) 15 MG tablet TAKE 1 TABLET (15 MG TOTAL) BY MOUTH DAILY. 90 tablet 1  . montelukast (SINGULAIR) 10 MG tablet Take 1 tablet (10 mg total) by mouth at bedtime. 90 tablet 1  . traZODone (DESYREL) 50 MG tablet Take 0.5-1 tablets (25-50 mg total) by mouth at bedtime as needed for sleep. 30 tablet 6   No current facility-administered medications on file prior to visit.     Review of Systems:  As per HPI- otherwise negative. No fever or chills  Physical Examination: Vitals:   02/21/19 1035  BP: 122/68  Pulse: 71   Resp: 16  Temp: 98.1 F (36.7 C)  SpO2: 98%   Vitals:   02/21/19 1035  Weight: 185 lb (83.9 kg)  Height: 5\' 4"  (1.626 m)   Body mass index is 31.76 kg/m. Ideal Body Weight: Weight in (lb) to have BMI = 25: 145.3  GEN: WDWN, NAD, Non-toxic, A & O x 3, overweight, looks well HEENT: Atraumatic, Normocephalic. Neck supple. No masses, No LAD. Ears and Nose: No external deformity. CV: RRR, No M/G/R. No JVD. No thrill. No extra heart sounds. PULM: CTA B, no wheezes, crackles, rhonchi. No retractions. No resp. distress. No accessory muscle use. ABD: S, NT, ND. No rebound. No HSM. EXTR: No c/c/e NEURO Normal gait.  PSYCH: Normally interactive. Conversant. Not depressed or anxious appearing.  Calm demeanor.  No tenderness of the bony spine.  She notes tenderness in the left buttock and piriformis distribution, less so over the left greater trochanter.  Spinal flexion is decreased due to pain, extension seems normal.  Bilateral lower extremity strength, sensation, DTRs normal.  She notes positive straight leg raise on the left.  Hip range of motion is normal, she does not have increased pain with flexed abduction or abduction No skin changes or rash   Assessment and Plan: Spasm of left piriformis muscle - Plan: predniSONE (DELTASONE) 20 MG tablet, cyclobenzaprine (FLEXERIL) 10 MG tablet, traMADol (ULTRAM) 50 MG tablet  Here today with left buttock pain, probably due to muscular spasm and impingement on the sciatic nerve. We will have her use oral prednisone for 10 days Prescription for Flexeril and tramadol to use as needed Advised the tramadol is a mild narcotic, do not use when driving or working.  Flexeril also can cause sedation, do not use when driving or working Avoid using these medications in combination, do not combine with trazodone No NSAIDs while on prednisone  She will let me know if not feeling significantly better in the next 1 or 2 days- Sooner if worse.     Signed Lamar Blinks, MD

## 2019-03-02 ENCOUNTER — Telehealth: Payer: Self-pay

## 2019-03-02 NOTE — Telephone Encounter (Signed)
Patient requesting to see if her DEXA has been ordered and placed to be scheduled. Patient has been scheduled for January 14th at 1:00. Patient notified and verbalized understanding.

## 2019-03-11 ENCOUNTER — Other Ambulatory Visit: Payer: Self-pay

## 2019-03-11 ENCOUNTER — Encounter: Payer: Self-pay | Admitting: Family Medicine

## 2019-03-11 ENCOUNTER — Ambulatory Visit (INDEPENDENT_AMBULATORY_CARE_PROVIDER_SITE_OTHER): Payer: No Typology Code available for payment source | Admitting: Family Medicine

## 2019-03-11 VITALS — BP 122/64 | HR 79 | Temp 97.8°F | Wt 180.0 lb

## 2019-03-11 DIAGNOSIS — M25552 Pain in left hip: Secondary | ICD-10-CM

## 2019-03-11 NOTE — Progress Notes (Signed)
Musculoskeletal Exam  Patient: Kathy King DOB: Jul 01, 1955  DOS: 03/11/2019  SUBJECTIVE:  Chief Complaint:   No chief complaint on file.   Kathy King is a 63 y.o.  female for evaluation and treatment of L hip pain.   Onset: Several months now Location: L outer hip Character:  aching and shooting  Progression of issue:  worsened Associated symptoms: no swelling, bruising, redness Treatment: to date has been injections, prednisone.  She was noncompliant with her home exercise recommendations. Neurovascular symptoms: no  ROS: Musculoskeletal/Extremities: +L hip pain  Past Medical History:  Diagnosis Date  . Adenomatous colon polyp    tubular  . CHICKENPOX, HX OF   . OSTEOPENIA   . SCIATICA, LEFT 08/09/2009   s/p PT, NSAIDs  . Unspecified vitamin D deficiency     Objective: VITAL SIGNS: BP 122/64 (BP Location: Left Arm, Patient Position: Sitting, Cuff Size: Normal)   Pulse 79   Temp 97.8 F (36.6 C) (Oral)   Wt 180 lb (81.6 kg)   SpO2 98%   BMI 30.90 kg/m  Constitutional: Well formed, well developed. No acute distress. Thorax & Lungs: No accessory muscle use Musculoskeletal: L hip.   Normal active range of motion: yes.   Normal passive range of motion: yes Tenderness to palpation: yes, over greater troch b/l, ext rotators of hip Deformity: no Ecchymosis: no Tests positive: Ober's Tests negative: Stinchfield, FABER, FADDIR, log roll Neurologic: Normal sensory function. No focal deficits noted. Gait mildly antalgic Psychiatric: Normal mood. Age appropriate judgment and insight. Alert & oriented x 3.    Assessment:  Left hip pain  Plan: Stretches and exercises given for a tight IT band, gluteus medius tendinopathy, and piriformis syndrome.  Heat, ice, Tylenol, ibuprofen, activity as tolerated. Given how tender her right greater trochanteric side is, will hold off on injections. F/u as needed. The patient voiced understanding and agreement to the  plan.   Mosinee, DO 03/11/19  12:46 PM

## 2019-03-11 NOTE — Patient Instructions (Addendum)
Heat (pad or rice pillow in microwave) over affected area, 10-15 minutes twice daily.   Ice/cold pack over area for 10-15 min twice daily.   Iliotibial Band Syndrome Rehab It is normal to feel mild stretching, pulling, tightness, or discomfort as you do these exercises, but you should stop right away if you feel sudden pain or your pain gets worse.  Stretching and range of motion exercises These exercises warm up your muscles and joints and improve the movement and flexibility of your hip and pelvis. Exercise A: Quadriceps, prone     1. Lie on your abdomen on a firm surface, such as a bed or padded floor. 2. Bend your left / right knee and hold your ankle. If you cannot reach your ankle or pant leg, loop a belt around your foot and grab the belt instead. 3. Gently pull your heel toward your buttocks. Your knee should not slide out to the side. You should feel a stretch in the front of your thigh and knee. 4. Hold this position for 30 seconds. Repeat 2 times. Complete this stretch 3 times per week. Exercise B: Iliotibial band    1. Lie on your side with your left / right leg in the top position. 2. Bend both of your knees and grab your left / right ankle. Stretch out your bottom arm to help you balance. 3. Slowly bring your top knee back so your thigh goes behind your trunk. 4. Slowly lower your top leg toward the floor until you feel a gentle stretch on the outside of your left / right hip and thigh. If you do not feel a stretch and your knee will not fall farther, place the heel of your other foot on top of your knee and pull your knee down toward the floor with your foot. 5. Hold this position for 30 seconds. Repeat 2 times. Complete this stretch 3 times per week. Strengthening exercises These exercises build strength and endurance in your hip and pelvis. Endurance is the ability to use your muscles for a long time, even after they get tired. Exercise C: Straight leg raises (hip  abductors)     1. Lie on your side with your left / right leg in the top position. Lie so your head, shoulder, knee, and hip line up. You may bend your bottom knee to help you balance. 2. Roll your hips slightly forward so your hips are stacked directly over each other and your left / right knee is facing forward. 3. Tense the muscles in your outer thigh and lift your top leg 4-6 inches (10-15 cm). 4. Hold this position for 3 seconds. Repeat for a total of 10 reps. 5. Slowly return to the starting position. Let your muscles relax completely before doing another repetition. Repeat 2 times. Complete this exercise 3 times per week. Exercise D: Straight leg raises (hip extensors) 1. Lie on your abdomen on your bed or a firm surface. You can put a pillow under your hips if that is more comfortable. 2. Bend your left / right knee so your foot is straight up in the air. 3. Squeeze your buttock muscles and lift your left / right thigh off the bed. Do not let your back arch. 4. Tense this muscle as hard as you can without increasing any knee pain. 5. Hold this position for 2 seconds. Repeat for a total of 10 reps 6. Slowly lower your leg to the starting position and allow it to relax completely. Repeat  2 times. Complete this exercise 3 times per week. Exercise E: Hip hike 1. Stand sideways on a bottom step. Stand on your left / right leg with your other foot unsupported next to the step. You can hold onto the railing or wall if needed for balance. 2. Keep your knees straight and your torso square. Then, lift your left / right hip up toward the ceiling. 3. Slowly let your left / right hip lower toward the floor, past the starting position. Your foot should get closer to the floor. Do not lean or bend your knees. Repeat 2 times. Complete this exercise 3 times per week.  Document Released: 04/14/2005 Document Revised: 12/18/2015 Document Reviewed: 03/16/2015 Elsevier Interactive Patient Education  2018  [employer].    Piriformis Syndrome Rehab It is normal to feel mild stretching, pulling, tightness, or discomfort as you do these exercises, but you should stop right away if you feel sudden pain or your pain gets worse.  Stretching and range of motion exercises These exercises warm up your muscles and joints and improve the movement and flexibility of your hip and pelvis. These exercises also help to relieve pain, numbness, and tingling. Exercise A: Hip rotators   1. Lie on your back on a firm surface. 2. Pull your left / right knee toward your same shoulder with your left / right hand until your knee is pointing toward the ceiling. Hold your left / right ankle with your other hand. 3. Keeping your knee steady, gently pull your left / right ankle toward your other shoulder until you feel a stretch in your buttocks. 4. Hold this position for 30 seconds. Repeat 2 times. Complete this stretch 3 times per week. Exercise B: Hip extensors 1. Lie on your back on a firm surface. Both of your legs should be straight. 2. Pull your left / right knee to your chest. Hold your leg in this position by holding onto the back of your thigh or the front of your knee. 3. Hold this position for 30 seconds. 4. Slowly return to the starting position. Repeat 2 times. Complete this stretch 3 times per week.  Strengthening exercises These exercises build strength and endurance in your hip and thigh muscles. Endurance is the ability to use your muscles for a long time, even after they get tired. Exercise C: Straight leg raises (hip abductors)    1. Lie on your side with your left / right leg in the top position. Lie so your head, shoulder, knee, and hip line up. Bend your bottom knee to help you balance. 2. Lift your top leg up 4-6 inches (10-15 cm), keeping your toes pointed straight ahead. 3. Hold this position for 1 second. 4. Slowly lower your leg to the starting position. Let your muscles relax  completely. Repeat for a total of 10 repetitions. Repeat 2 times. Complete this exercise 3 times per week. Exercise D: Hip abductors and rotators, quadruped   1. Get on your hands and knees on a firm, lightly padded surface. Your hands should be directly below your shoulders, and your knees should be directly below your hips. 2. Lift your left / right knee out to the side. Keep your knee bent. Do not twist your body. 3. Hold this position for 1 seconds. 4. Slowly lower your leg. Repeat for a total of 10 repetitions.  Repeat 1 times. Complete this exercise 3 times per week. Exercise E: Straight leg raises (hip extensors) 1. Lie on your abdomen on a  bed or a firm surface with a pillow under your hips. 2. Squeeze your buttock muscles and lift your left / right thigh off the bed. Do not let your back arch. 3. Hold this position for 3 seconds. 4. Slowly return to the starting position. Let your muscles relax completely before doing another repetition. Repeat 2 times. Complete this exercise 3 times per week.  This information is not intended to replace advice given to you by your health care provider. Make sure you discuss any questions you have with your health care provider. Document Released: 04/14/2005 Document Revised: 12/18/2015 Document Reviewed: 03/27/2015 Elsevier Interactive Patient Education  2018 [place] Syndrome Rehab It is normal to feel mild stretching, pulling, tightness, or discomfort as you do these exercises, but you should stop right away if you feel sudden pain or your pain gets worse.   Stretching and range of motion exercise This exercise warms up your muscles and joints and improves the movement and flexibility of your hip and pelvis. This exercise also helps to relieve pain and stiffness. Exercise A: Lunge (hip flexor stretch)     1. Kneel on the floor on your left / right knee. Bend your other knee so it is directly over your ankle. 2. Keep  good posture with your head over your shoulders. Tuck your tailbone underneath you. This will prevent your back from arching too much. 3. You should feel a gentle stretch in the front of your thigh or hip. If you do not feel a stretch, slowly lunge forward with your chest up. 4. Hold this position for 30 seconds. 5. Slowly return to the starting position. Repeat 2 times. Complete this exercise 3 times per week. Strengthening exercises These exercises build strength and endurance in your hip and pelvis. Endurance is the ability to use your muscles for a long time, even after they get tired. Exercise B: Bridge (hip extensors)    1. Lie on your back on a firm surface with your knees bent and your feet flat on the floor. 2. Tighten your buttocks muscles and lift your bottom off the floor until the trunk of your body is level with your thighs. ? You should feel the muscles working in your buttocks and the back of your thighs. If this exercise is too easy, cross your arms over your chest or lift one leg while your bottom is up off the floor. ? Do not arch your back. 3. Hold this position for 3 seconds. 4. Slowly lower your hips to the starting position. 5. Let your muscles relax completely between repetitions. Repeat 2 times. Complete this exercise 3 times per week. Exercise C: Straight leg raises (hip abductors)    1. Lie on your side with your left / right leg in the top position. Lie so your head, shoulder, knee, and hip line up. Bend your bottom knee to help you balance. 2. Lift your top leg up 4-6 inches (10-15 cm), keeping your toes pointed straight ahead. 3. Hold this position for 2 seconds. 4. Slowly lower your leg to the starting position and let your muscles relax completely. Repeat for a total of 10 repetitions. Repeat 2 times. Complete this exercise 3 times per week. Exercise D: Hip abductors and external rotators, quadruped 1. Get on your hands and knees on a firm, lightly padded  surface. Your hands should be directly below your shoulders, and your knees should be directly below your hips. 2. Lift your left / right knee  out to the side. Keep your knee bent. Do not twist your body. 3. Hold this position for 3 seconds. 4. Slowly lower your leg. Repeat for a total of 10 repetitions.  Repeat 2 times. Complete this exercise 3 times per week. Exercise E: Single leg stand 1. Stand near a counter or door frame to hold onto as needed. It is helpful to look in a mirror for this exercise so you can watch your hip. 2. Squeeze your left / right buttock muscles then lift up your other foot. Do not let your left / righthip push out to the side. 3. Hold this position for 3 seconds. Repeat for a total of 10 repetitions. Repeat 2 times. Complete this exercise 3 times per week. Make sure you discuss any questions you have with your health care provider. Document Released: 04/14/2005 Document Revised: 12/20/2015 Document Reviewed: 03/27/2015 Elsevier Interactive Patient Education  [employer].

## 2019-05-12 ENCOUNTER — Ambulatory Visit
Admission: RE | Admit: 2019-05-12 | Discharge: 2019-05-12 | Disposition: A | Discharge status: Home / Self Care | Payer: No Typology Code available for payment source | Source: Ambulatory Visit | Attending: Family Medicine | Admitting: Family Medicine

## 2019-05-12 ENCOUNTER — Other Ambulatory Visit: Payer: Self-pay

## 2019-05-12 ENCOUNTER — Encounter: Payer: Self-pay | Admitting: Family Medicine

## 2019-05-12 DIAGNOSIS — E2839 Other primary ovarian failure: Secondary | ICD-10-CM

## 2019-05-12 DIAGNOSIS — M8580 Other specified disorders of bone density and structure, unspecified site: Secondary | ICD-10-CM | POA: Insufficient documentation

## 2019-08-03 ENCOUNTER — Other Ambulatory Visit: Payer: Self-pay

## 2019-08-03 DIAGNOSIS — Z0100 Encounter for examination of eyes and vision without abnormal findings: Secondary | ICD-10-CM

## 2019-08-03 NOTE — Progress Notes (Signed)
Patient requested referral placed to [facility] for routine exam. Referral placed for patient.

## 2019-09-04 NOTE — Progress Notes (Signed)
[facility] [address], [place], [place] [address] [phone]  Date:  09/05/2019   Name:  Kathy King   DOB:  1955/11/09   MRN:  [id number]  PCP:  Darreld Mclean, MD    Chief Complaint: Insomnia   History of Present Illness:  Kathy King is a 64 y.o. very pleasant female patient who presents with the following:  Generally clear history of osteopenia and vitamin D deficiency, here for concern of trouble sleeping  Unfortunately Kathy King has been under a fair amount of stress between family and work recently.  Her mother is elderly and needs quite a bit of care, she has 4 children of her own who also are going through some challenges She goes to bed at 9 and is exhausted.  She will fall asleep but then wakes up off and on during the night.  She will wake up 3-4x between 11 pm and getting up. She gets up around 4 am to go to work  She thinks she is only getting perhaps 4 hours of sleep most nights She is feeling really tired, sometimes irritable.  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  She has not taken time off for vacation in some time  Her husband has noted that she snores- he suggested that she get a sleep study She may have some apneic spells that seem to partially wake her up  She has tried some over-the-counter sleep aids including melatonin and magnesium supplement.  She did use trazodone in the past, she notes that she recently tried a leftover trazodone and did find it was helpful  Lab Results  Component Value Date   TSH 2.09 02/10/2019    She is not big on naps, does not generally take a nap    Patient Active Problem List   Diagnosis Date Noted  . Osteopenia 05/12/2019  . Synovitis of left foot 05/25/2018  . Low back pain 12/12/2016  . Vitamin D deficiency   . Cough variant asthma   . Lumbar radiculopathy 10/22/2011  . SCIATICA, LEFT 08/09/2009  . Disorder of bone  and cartilage 08/09/2009    Past Medical History:  Diagnosis Date  . Adenomatous colon polyp    tubular  . CHICKENPOX, HX OF   . OSTEOPENIA   . SCIATICA, LEFT 08/09/2009   s/p PT, NSAIDs  . Unspecified vitamin D deficiency     Past Surgical History:  Procedure Laterality Date  . Arthroscopic shoulder  2008  . CERVICAL FUSION  2006  . CESAREAN SECTION  88 & 93   x's 2  . TONSILLECTOMY  1965    Social History   Tobacco Use  . Smoking status: Never Smoker  . Smokeless tobacco: Never Used  Substance Use Topics  . Alcohol use: No    Alcohol/week: 0.0 standard drinks  . Drug use: No    Family History  Problem Relation Age of Onset  . Memory loss Mother   . Hypertension Mother   . Hyperlipidemia Mother   . Arthritis Other        grandparent  . Breast cancer Maternal Grandmother   . Seizures Son     Allergies  Allergen Reactions  . Sulfonamide Derivatives     REACTION: unspecified; as child    Medication list has been reviewed and updated.  Current Outpatient Medications on File Prior to Visit  Medication Sig Dispense Refill  .  calcium gluconate 500 MG tablet Take 500 mg by mouth daily.      . Cholecalciferol (VITAMIN D) 2000 UNITS CAPS Take by mouth.      . cyclobenzaprine (FLEXERIL) 10 MG tablet Take 1 tablet (10 mg total) by mouth 2 (two) times daily as needed for muscle spasms. 30 tablet 0  . levocetirizine (XYZAL) 5 MG tablet Take 1 tablet (5 mg total) by mouth every evening. 90 tablet 1  . meloxicam (MOBIC) 15 MG tablet TAKE 1 TABLET (15 MG TOTAL) BY MOUTH DAILY. 90 tablet 1  . montelukast (SINGULAIR) 10 MG tablet Take 1 tablet (10 mg total) by mouth at bedtime. 90 tablet 1   No current facility-administered medications on file prior to visit.    Review of Systems:  As per HPI- otherwise negative.   Physical Examination: Vitals:   09/05/19 1110  BP: 120/70  Pulse: 70  Resp: 16  Temp: (!) 96 F (35.6 C)  SpO2: 98%   Vitals:   09/05/19 1110   Weight: 179 lb 12.8 oz (81.6 kg)  Height: 5\' 3"  (1.6 m)   Body mass index is 31.85 kg/m. Ideal Body Weight: Weight in (lb) to have BMI = 25: 140.8  GEN: no acute distress.  Overweight, otherwise looks well HEENT: Atraumatic, Normocephalic.  Ears and Nose: No external deformity. CV: RRR, No M/G/R. No JVD. No thrill. No extra heart sounds. PULM: CTA B, no wheezes, crackles, rhonchi. No retractions. No resp. distress. No accessory muscle use. EXTR: No c/c/e PSYCH: Normally interactive. Conversant.    Assessment and Plan: Difficulty sleeping - Plan: Ambulatory referral to Neurology  Sleep concern - Plan: traZODone (DESYREL) 50 MG tablet  Snoring - Plan: Ambulatory referral to Neurology  Kathy King is here today with concern about sleep.  She notes difficulty with sleep for many years, but recently has felt just exhausted.  Her husband has noted possible sleep apnea symptoms, I referred her to neurology for an evaluation Kathy King has quite a bit of vacation time banked, I encouraged her to use some of this time to let herself rest and recover.  We will try trazodone also as needed for sleep, if this is not helpful we can potentially try a benzodiazepine or some other agent.  She will keep me posted about her progress, and let me know if any other medications are desired. Moderate medical decision making today This visit occurred during the SARS-CoV-2 public health emergency.  Safety protocols were in place, including screening questions prior to the visit, additional usage of staff PPE, and extensive cleaning of exam room while observing appropriate contact time as indicated for disinfecting solutions.    Signed Lamar Blinks, MD

## 2019-09-05 ENCOUNTER — Encounter: Payer: Self-pay | Admitting: Family Medicine

## 2019-09-05 ENCOUNTER — Other Ambulatory Visit: Payer: Self-pay

## 2019-09-05 ENCOUNTER — Ambulatory Visit (INDEPENDENT_AMBULATORY_CARE_PROVIDER_SITE_OTHER): Payer: No Typology Code available for payment source | Admitting: Family Medicine

## 2019-09-05 ENCOUNTER — Other Ambulatory Visit: Payer: Self-pay | Admitting: Family Medicine

## 2019-09-05 VITALS — BP 120/70 | HR 70 | Temp 96.0°F | Resp 16 | Ht 63.0 in | Wt 179.8 lb

## 2019-09-05 DIAGNOSIS — R0683 Snoring: Secondary | ICD-10-CM

## 2019-09-05 DIAGNOSIS — Z7689 Persons encountering health services in other specified circumstances: Secondary | ICD-10-CM | POA: Diagnosis not present

## 2019-09-05 DIAGNOSIS — G479 Sleep disorder, unspecified: Secondary | ICD-10-CM | POA: Diagnosis not present

## 2019-09-05 MED ORDER — TRAZODONE HCL 50 MG PO TABS: 50.0000 mg | ORAL_TABLET | Freq: Every evening | ORAL | 6 refills | Status: DC | PRN

## 2019-09-05 NOTE — Patient Instructions (Signed)
It was good to see you today but I am sorry you are having a hard time.   Let's try some trazodone as needed for sleep- you might also try doxylamine OTC We will set you up for a sleep study

## 2019-09-15 ENCOUNTER — Ambulatory Visit: Payer: No Typology Code available for payment source | Admitting: Neurology

## 2019-09-15 ENCOUNTER — Other Ambulatory Visit: Payer: Self-pay

## 2019-09-15 ENCOUNTER — Encounter: Payer: Self-pay | Admitting: Neurology

## 2019-09-15 VITALS — BP 132/84 | HR 82 | Ht 63.0 in | Wt 183.0 lb

## 2019-09-15 DIAGNOSIS — R0683 Snoring: Secondary | ICD-10-CM | POA: Diagnosis not present

## 2019-09-15 DIAGNOSIS — N951 Menopausal and female climacteric states: Secondary | ICD-10-CM | POA: Diagnosis not present

## 2019-09-15 DIAGNOSIS — G4709 Other insomnia: Secondary | ICD-10-CM

## 2019-09-15 DIAGNOSIS — G478 Other sleep disorders: Secondary | ICD-10-CM | POA: Diagnosis not present

## 2019-09-15 NOTE — Patient Instructions (Signed)

## 2019-09-15 NOTE — Progress Notes (Signed)
SLEEP MEDICINE CLINIC    Provider:  Larey Seat, MD  Primary Care Physician:  Darreld Mclean, [address] [place] [address]     Referring Provider: Darreld Mclean, [address] [place],  [place] [address]          Chief Complaint according to patient   Patient presents with:    . New Patient (Initial Visit)     pt alone, tm 10. pt presents today to discuss difficulty with her sleep. she states that she usually can fall asleep with no problems but wakes up anywhere from 12-3 am and struggles with falling back to sleep. wakes up to void several times. pt states she has been told snores and has some irreuglarties with breathing in sleep per husband. 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 she states that she usually can fall asleep without problems but wakes up between 12-3 am and struggles with returning back to sleep. Wakes up and uses the arousal to void several times,pt states she has been told snores and has some irreuglarties with breathing in sleep per husband.  Son is an ICU Kathy and has CPAP. 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 Shift worker, menopausal since age 89. Progesteron helped sleeping better.      Sleep relevant medical history: Nocturia/ hypersomnia after lunch- sore and fatigued. Tonsillectomy , anterior cervical spine surgery, no ENT surgery, some hearing loss.    Family medical /sleep history: son with OSA, insomnia.    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  The patient currently works 10 hour shifts.  Pets are present. Tobacco use: never .  ETOH use; none,  Caffeine intake in form of Coffee( 2-3 / AM) Soda( none ) Tea ( rare ) or energy drinks. Regular exercise in form of walking.   Hobbies : large family , gardening.    Sleep habits are as follows: The patient's work ends at 5.30 and dinner time is between 6-7 PM. The patient goes to bed at 8.30-9 PM and continues to sleep for 2-3 hours, wakes spontaneously and uses this for frequent  bathroom breaks.  Light sleeper.  The preferred sleep position is Right side, left hip bursitis, with the support of 2  pillows.  Dreams are reportedly infrequent . 4.15 AM is the usual rise time.  The patient wakes up spontaneously at 4 AM with an alarm.   She reports not feeling refreshed or restored in AM, with morning headaches, and residual fatigue.  Naps are taken rarely.   Review of Systems: Out of a complete 14 system review, the patient complains of only the following symptoms, and all other reviewed systems are negative.:  Fatigue, sleepiness , snoring,  fragmented sleep, Insomnia - frequently voiding.   Hot flushes.   How likely are you to doze in the following situations: 0 =  not likely, 1 = slight chance, 2 = moderate chance, 3 = high chance   Sitting and Reading? Watching Television? Sitting inactive in a public place (theater or meeting)? As a passenger in a car for an hour without a break? Lying down in the afternoon when circumstances permit? Sitting and talking to someone? Sitting quietly after lunch without alcohol? In a car, while stopped for a few minutes in traffic?   Total = 8/ 24 points   FSS endorsed at 55/ 63 points.   Social History   Socioeconomic History  . Marital status: Married    Spouse name: Not on file  . Number of children: Not on file  . Years of education: Not on file  . Highest education level: Not on file    Occupational History  . Not on file  Tobacco Use  . Smoking status: Never Smoker  . Smokeless tobacco: Never Used  Substance and Sexual Activity  . Alcohol use: No    Alcohol/week: 0.0 standard drinks  . Drug use: No  . Sexual activity: Not on file  Other Topics Concern  . Not on file  Social History Narrative   Married, lives with spouse and kids   Social Determinants of Health   Financial Resource Strain:   . Difficulty of Paying Living Expenses:   Food Insecurity:   . Worried About Charity fundraiser in the Last Year:   . Arboriculturist in the Last Year:   Transportation Needs:   . Film/video editor (Medical):   Marland Kitchen Lack of Transportation (Non-Medical):   Physical Activity:   . Days of Exercise per Week:   . Minutes of Exercise per Session:   Stress:   . Feeling of Stress :   Social Connections:   . Frequency of Communication with Friends and Family:   . Frequency of Social Gatherings with Friends and Family:   . Attends Religious Services:   . Active Member of Clubs or Organizations:   . Attends Archivist Meetings:   Marland Kitchen Marital Status:     Family History  Problem Relation Age of Onset  . Memory loss Mother   . Hypertension Mother   . Hyperlipidemia Mother   . Arthritis Other        grandparent  . Breast cancer Maternal Grandmother   . Seizures Son     Past Medical History:  Diagnosis Date  . Adenomatous colon polyp    tubular  . CHICKENPOX, HX OF   . OSTEOPENIA   . SCIATICA, LEFT 08/09/2009   s/p PT, NSAIDs  . Unspecified vitamin D deficiency     Past Surgical History:  Procedure Laterality Date  . Arthroscopic shoulder  2008  . CERVICAL FUSION  2006  . CESAREAN SECTION  88 & 93   x's 2  . TONSILLECTOMY  1965     Current Outpatient Medications on File Prior to Visit  Medication Sig Dispense Refill  . calcium gluconate 500 MG tablet Take 500 mg by mouth daily.      . Cholecalciferol (VITAMIN D) 2000 UNITS CAPS Take by mouth.       . traZODone (DESYREL) 50 MG tablet Take 1-2 tablets (50-100 mg total) by mouth at bedtime as needed for sleep. (Patient not taking: Reported on 09/15/2019) 60 tablet 6   No current facility-administered medications on file prior to visit.    Allergies  Allergen Reactions  . Sulfonamide Derivatives     REACTION: unspecified; as  child    Physical exam:  Today's Vitals   09/15/19 1045  BP: 132/84  Pulse: 82  Weight: 183 lb (83 kg)  Height: 5\' 3"  (1.6 m)   Body mass index is 32.42 kg/m.   Wt Readings from Last 3 Encounters:  09/15/19 183 lb (83 kg)  09/05/19 179 lb 12.8 oz (81.6 kg)  03/11/19 180 lb (81.6 kg)     Ht Readings from Last 3 Encounters:  09/15/19 5\' 3"  (1.6 m)  09/05/19 5\' 3"  (1.6 m)  02/21/19 5\' 4"  (1.626 m)      General: The patient is awake, alert and appears not in acute distress. The patient is well groomed. Head: Normocephalic, atraumatic. Neck is supple. Mallampati 3,  neck circumference:15 inches . Nasal airflow patent.  Retrognathia is not  seen.  Dental status: intact  Cardiovascular:  Regular rate and cardiac rhythm by pulse,  without distended neck veins. Respiratory: Lungs are clear to auscultation.  Skin:  Without evidence of ankle edema, or rash. Trunk: The patient's posture is erect.   Neurologic exam : The patient is awake and alert, oriented to place and time.   Memory subjective described as intact.  Attention span & concentration ability appears normal.  Speech is fluent,  without  dysarthria, dysphonia or aphasia.  Mood and affect are appropriate.   Cranial nerves: no loss of smell or taste reported  Pupils are equal and briskly reactive to light. Funduscopic exam: deferred. .  Extraocular movements in vertical and horizontal planes were intact and without nystagmus. No Diplopia. Visual fields by finger perimetry are intact. Hearing was intact to soft voice and finger rubbing.    Facial sensation intact to fine touch.  Facial  motor strength is symmetric and tongue and uvula move midline.  Neck ROM : rotation, tilt and flexion extension were normal for age and shoulder shrug was symmetrical.    Motor exam:  Symmetric bulk, tone and ROM.   Normal tone without cog wheeling, symmetric grip strength .   Sensory:  Fine touch, pinprick and vibration were tested  and  normal.  Proprioception tested in the upper extremities was normal.   Coordination: Rapid alternating movements in the fingers/hands were of normal speed.  The Finger-to-nose maneuver was intact without evidence of ataxia, dysmetria or tremor.   Gait and station: Patient could rise unassisted from a seated position, walked without assistive device.  Stance is of normal width/ base and the patient turned with 3 steps.  Toe and heel walk were deferred.  Deep tendon reflexes: in the  upper and lower extremities are symmetric and intact.  Babinski response was deferred .      After spending a total time of  35 minutes face to face and additional time for physical and neurologic examination, review of laboratory studies,  personal review of imaging studies, reports and results of other testing and review of referral information / records as far as provided in visit, I have established the following assessments:  Kathy King is a 64 year old right-handed Caucasian female with a history of menopausal related sleep interruptions, lighter sleep, sleep fragmentation.  She has also been witnessed to have some irregular breathing at night and she snores.  She slept last very well when she was on progesterone supplements.  These have not been prescribed in the last couple of years. She still has hot flashes at night and she reports that she is such a light sleeper that she often wakes up by  no identifiable trigger at all and then goes to the bathroom but it is not the urge to urinate very well..  She can visit the bathroom 4 times or more at night.  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  Her physical exam reveals mild retrognathia there is no scalloped tongue but a low soft tissue creating a high right Mallampati.  Neck circumference is normal, BMI is slightly elevated but she is not morbidly obese.  Muscle tone coordination and sensation to vibration and touch are intact.     My Plan is to proceed with:  1) OSA - sleep apnea, we can do HST or PSG.  2) I suggested to discuss external topical or intravaginal use of progesterone, which is associated with a low cancer risk. She will discuss with Dr Lorelei Pont.  3) insomnia may be related to OSA- melatonin recommended.    I would like to thank  [place], [address], [address],  [place] [id number] for allowing me to meet with and to take care of this pleasant patient.    I plan to follow up either personally or through our NP within 2-3  month.     Electronically signed by: Larey Seat, MD 09/15/2019 11:22 AM  [facility] Board certified by The [employer] of Sleep Medicine and Diplomate of the [employer] of Sleep Medicine. Board certified In Neurology through the [place], Fellow of the [employer] of Neurology. Medical Director of [facility].

## 2019-09-20 ENCOUNTER — Telehealth: Payer: Self-pay

## 2019-09-20 NOTE — Telephone Encounter (Signed)
LVM for pt to call me back to schedule sleep study  

## 2019-10-17 ENCOUNTER — Encounter: Payer: Self-pay | Admitting: Family Medicine

## 2019-10-17 ENCOUNTER — Ambulatory Visit (INDEPENDENT_AMBULATORY_CARE_PROVIDER_SITE_OTHER): Payer: No Typology Code available for payment source | Admitting: Family Medicine

## 2019-10-17 ENCOUNTER — Other Ambulatory Visit: Payer: Self-pay

## 2019-10-17 VITALS — BP 126/70 | HR 70 | Temp 98.0°F | Resp 12 | Ht 63.0 in | Wt 185.0 lb

## 2019-10-17 DIAGNOSIS — M25552 Pain in left hip: Secondary | ICD-10-CM

## 2019-10-17 MED ORDER — MELOXICAM 15 MG PO TABS: 15.0000 mg | ORAL_TABLET | Freq: Every day | ORAL | 0 refills | Status: DC

## 2019-10-17 MED ORDER — OXYCODONE HCL 5 MG PO TABS: 5.0000 mg | ORAL_TABLET | ORAL | 0 refills | Status: DC | PRN

## 2019-10-17 MED ORDER — METHYLPREDNISOLONE ACETATE 80 MG/ML IJ SUSP
80.0000 mg | Freq: Once | INTRAMUSCULAR | Status: AC
  Administered 2019-10-17: 80 mg via INTRAMUSCULAR

## 2019-10-17 MED FILL — MELOXICAM 15 MG TABLET: 15 | 30 days supply | Qty: 30 | Fill #0

## 2019-10-17 MED FILL — oxyCODONE HCL 5 MG TABS: 5 | 5 days supply | Qty: 30 | Fill #0

## 2019-10-17 NOTE — Progress Notes (Signed)
Musculoskeletal Exam  Patient: Kathy King DOB: 1956/03/07  DOS: 10/17/2019  SUBJECTIVE:  Chief Complaint:   Chief Complaint  Patient presents with  . Hip Pain    left hip pain    Kathy King is a 64 y.o.  female for evaluation and treatment of L hip pain.   Onset:  2 days ago. No inj or change in activity.  Location: L outer hip Character:  sharp and stabbing  Progression of issue:  is unchanged Associated symptoms: no bruising, redness, swelling Treatment: to date has been ice, OTC NSAIDS, acetaminophen, muscle relaxers and heat.   Neurovascular symptoms: no   Past Medical History:  Diagnosis Date  . Adenomatous colon polyp    tubular  . CHICKENPOX, HX OF   . OSTEOPENIA   . SCIATICA, LEFT 08/09/2009   s/p PT, NSAIDs  . Unspecified vitamin D deficiency     Objective: VITAL SIGNS: BP 126/70 (BP Location: Right Arm, Cuff Size: Large)   Pulse 70   Temp 98 F (36.7 C) (Temporal)   Resp 12   Ht 5\' 3"  (1.6 m)   Wt 185 lb (83.9 kg)   SpO2 98%   BMI 32.77 kg/m  Constitutional: Well formed, well developed. No acute distress. Cardiovascular: Brisk cap refill Thorax & Lungs: No accessory muscle use Musculoskeletal: L hip.   Normal active range of motion: yes.   Normal passive range of motion: yes Tenderness to palpation: Yes, over the distal insertion of the left gluteus medius, greater trochanteric bursa, and bilateral lumbar paraspinal musculature and cephalad portion of the SI joint bilaterally Deformity: no Ecchymosis: no Tests positive: None Tests negative: Stinchfield, logroll, FABER, FADDIR Neurologic: Normal sensory function. No focal deficits noted. DTR's equal and symmetric in LE's. No clonus. Psychiatric: Normal mood. Age appropriate judgment and insight. Alert & oriented x 3.    Assessment:  Left hip pain - Plan: meloxicam (MOBIC) 15 MG tablet, oxyCODONE (OXY IR/ROXICODONE) 5 MG immediate release tablet, methylPREDNISolone acetate (DEPO-MEDROL)  injection 80 mg  Plan: Orders as above. Warnings about oxy provided. 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 Tylenol. Ice. Stretches/exercises.  I think she is a combination of greater trochanter bursitis and gluteus medius tendinopathy.  She has longstanding IT band syndrome as well which likely contributes to the bursitis. F/u prn. The patient voiced understanding and agreement to the plan.   [place], DO 10/17/19  10:35 AM

## 2019-10-17 NOTE — Patient Instructions (Addendum)
Ice/cold pack over area for 10-15 min twice daily.  Heat (pad or rice pillow in microwave) over affected area, 10-15 minutes twice daily.   Do not drink alcohol, do any illicit/street drugs, drive or do anything that requires alertness while on this medicine.   OK to take Tylenol 1000 mg (2 extra strength tabs) or 975 mg (3 regular strength tabs) every 6 hours as needed.   If you start having fevers, spreading redness, or increasing pain, please let me know.     Gluteus Medius Syndrome Rehab It is normal to feel mild stretching, pulling, tightness, or discomfort as you do these exercises, but you should stop right away if you feel sudden pain or your pain gets worse.   Stretching and range of motion exercise This exercise warms up your muscles and joints and improves the movement and flexibility of your hip and pelvis. This exercise also helps to relieve pain and stiffness. Exercise A: Lunge (hip flexor stretch)     1. Kneel on the floor on your left / right knee. Bend your other knee so it is directly over your ankle. 2. Keep good posture with your head over your shoulders. Tuck your tailbone underneath you. This will prevent your back from arching too much. 3. You should feel a gentle stretch in the front of your thigh or hip. If you do not feel a stretch, slowly lunge forward with your chest up. 4. Hold this position for 30 seconds. 5. Slowly return to the starting position. Repeat 2 times. Complete this exercise 3 times per week. Strengthening exercises These exercises build strength and endurance in your hip and pelvis. Endurance is the ability to use your muscles for a long time, even after they get tired. Exercise B: Bridge (hip extensors)    1. Lie on your back on a firm surface with your knees bent and your feet flat on the floor. 2. Tighten your buttocks muscles and lift your bottom off the floor until the trunk of your body is level with your thighs. ? You should feel the  muscles working in your buttocks and the back of your thighs. If this exercise is too easy, cross your arms over your chest or lift one leg while your bottom is up off the floor. ? Do not arch your back. 3. Hold this position for 3 seconds. 4. Slowly lower your hips to the starting position. 5. Let your muscles relax completely between repetitions. Repeat 2 times. Complete this exercise 3 times per week. Exercise C: Straight leg raises (hip abductors)    1. Lie on your side with your left / right leg in the top position. Lie so your head, shoulder, knee, and hip line up. Bend your bottom knee to help you balance. 2. Lift your top leg up 4-6 inches (10-15 cm), keeping your toes pointed straight ahead. 3. Hold this position for 2 seconds. 4. Slowly lower your leg to the starting position and let your muscles relax completely. Repeat for a total of 10 repetitions. Repeat 2 times. Complete this exercise 3 times per week. Exercise D: Hip abductors and external rotators, quadruped 1. Get on your hands and knees on a firm, lightly padded surface. Your hands should be directly below your shoulders, and your knees should be directly below your hips. 2. Lift your left / right knee out to the side. Keep your knee bent. Do not twist your body. 3. Hold this position for 3 seconds. 4. Slowly lower your leg.  Repeat for a total of 10 repetitions.  Repeat 2 times. Complete this exercise 3 times per week. Exercise E: Single leg stand 1. Stand near a counter or door frame to hold onto as needed. It is helpful to look in a mirror for this exercise so you can watch your hip. 2. Squeeze your left / right buttock muscles then lift up your other foot. Do not let your left / righthip push out to the side. 3. Hold this position for 3 seconds. Repeat for a total of 10 repetitions. Repeat 2 times. Complete this exercise 3 times per week. Make sure you discuss any questions you have with your health care  provider. Document Released: 04/14/2005 Document Revised: 12/20/2015 Document Reviewed: 03/27/2015 Elsevier Interactive Patient Education  2018 [place] Band Syndrome Rehab It is normal to feel mild stretching, pulling, tightness, or discomfort as you do these exercises, but you should stop right away if you feel sudden pain or your pain gets worse.  Stretching and range of motion exercises These exercises warm up your muscles and joints and improve the movement and flexibility of your hip and pelvis. Exercise A: Quadriceps, prone    1. Lie on your abdomen on a firm surface, such as a bed or padded floor. 2. Bend your left / right knee and hold your ankle. If you cannot reach your ankle or pant leg, loop a belt around your foot and grab the belt instead. 3. Gently pull your heel toward your buttocks. Your knee should not slide out to the side. You should feel a stretch in the front of your thigh and knee. 4. Hold this position for 30 seconds. Repeat 2 times. Complete this stretch 3 times per week. Exercise B: Iliotibial band    1. Lie on your side with your left / right leg in the top position. 2. Bend both of your knees and grab your left / right ankle. Stretch out your bottom arm to help you balance. 3. Slowly bring your top knee back so your thigh goes behind your trunk. 4. Slowly lower your top leg toward the floor until you feel a gentle stretch on the outside of your left / right hip and thigh. If you do not feel a stretch and your knee will not fall farther, place the heel of your other foot on top of your knee and pull your knee down toward the floor with your foot. 5. Hold this position for 30 seconds. Repeat 2 times. Complete this stretch 3 times per week. Strengthening exercises These exercises build strength and endurance in your hip and pelvis. Endurance is the ability to use your muscles for a long time, even after they get tired. Exercise C: Straight leg  raises (hip abductors)     1. Lie on your side with your left / right leg in the top position. Lie so your head, shoulder, knee, and hip line up. You may bend your bottom knee to help you balance. 2. Roll your hips slightly forward so your hips are stacked directly over each other and your left / right knee is facing forward. 3. Tense the muscles in your outer thigh and lift your top leg 4-6 inches (10-15 cm). 4. Hold this position for 3 seconds. Repeat for a total of 10 reps. 5. Slowly return to the starting position. Let your muscles relax completely before doing another repetition. Repeat 2 times. Complete this exercise 3 times per week. Exercise D: Straight leg raises (hip  extensors) 1. Lie on your abdomen on your bed or a firm surface. You can put a pillow under your hips if that is more comfortable. 2. Bend your left / right knee so your foot is straight up in the air. 3. Squeeze your buttock muscles and lift your left / right thigh off the bed. Do not let your back arch. 4. Tense this muscle as hard as you can without increasing any knee pain. 5. Hold this position for 2 seconds. Repeat for a total of 10 reps 6. Slowly lower your leg to the starting position and allow it to relax completely. Repeat 2 times. Complete this exercise 3 times per week. Exercise E: Hip hike 1. Stand sideways on a bottom step. Stand on your left / right leg with your other foot unsupported next to the step. You can hold onto the railing or wall if needed for balance. 2. Keep your knees straight and your torso square. Then, lift your left / right hip up toward the ceiling. 3. Slowly let your left / right hip lower toward the floor, past the starting position. Your foot should get closer to the floor. Do not lean or bend your knees. Repeat 2 times. Complete this exercise 3 times per week.  Document Released: 04/14/2005 Document Revised: 12/18/2015 Document Reviewed: 03/16/2015 Elsevier Interactive Patient  Education  [employer].

## 2019-10-19 ENCOUNTER — Other Ambulatory Visit: Payer: Self-pay | Admitting: Family Medicine

## 2019-10-19 DIAGNOSIS — M25552 Pain in left hip: Secondary | ICD-10-CM

## 2019-10-20 ENCOUNTER — Encounter: Payer: Self-pay | Admitting: Family Medicine

## 2019-10-20 ENCOUNTER — Ambulatory Visit (INDEPENDENT_AMBULATORY_CARE_PROVIDER_SITE_OTHER): Payer: No Typology Code available for payment source

## 2019-10-20 ENCOUNTER — Ambulatory Visit: Payer: Self-pay

## 2019-10-20 ENCOUNTER — Ambulatory Visit: Payer: No Typology Code available for payment source | Admitting: Family Medicine

## 2019-10-20 ENCOUNTER — Other Ambulatory Visit: Payer: Self-pay

## 2019-10-20 VITALS — BP 100/80 | HR 77 | Ht 63.0 in | Wt 185.0 lb

## 2019-10-20 DIAGNOSIS — G8929 Other chronic pain: Secondary | ICD-10-CM | POA: Diagnosis not present

## 2019-10-20 DIAGNOSIS — M545 Low back pain, unspecified: Secondary | ICD-10-CM

## 2019-10-20 DIAGNOSIS — M5416 Radiculopathy, lumbar region: Secondary | ICD-10-CM | POA: Diagnosis not present

## 2019-10-20 MED ORDER — PREDNISONE 50 MG PO TABS
ORAL_TABLET | ORAL | 0 refills | Status: DC
Start: 2019-10-20 — End: 2020-04-18

## 2019-10-20 MED ORDER — GABAPENTIN 100 MG PO CAPS: 200.0000 mg | ORAL_CAPSULE | Freq: Every day | ORAL | 3 refills | Status: DC

## 2019-10-20 MED ORDER — TIZANIDINE HCL 4 MG PO TABS: 4.0000 mg | ORAL_TABLET | Freq: Four times a day (QID) | ORAL | 1 refills | Status: DC | PRN

## 2019-10-20 MED FILL — predniSONE 50 MG TABS: 50 | 5 days supply | Qty: 5 | Fill #0

## 2019-10-20 MED FILL — tiZANidine HCL 4 MG TABS: 4 | 8 days supply | Qty: 30 | Fill #0

## 2019-10-20 MED FILL — GABAPENTIN 100 MG CAPSULE: 100 | 90 days supply | Qty: 180 | Fill #0

## 2019-10-20 NOTE — Progress Notes (Signed)
[facility] [address] [place] Phone: [phone] Subjective:   I Kandace Blitz am serving as a Education administrator for Dr. Hulan Saas.  This visit occurred during the SARS-CoV-2 public health emergency.  Safety protocols were in place, including screening questions prior to the visit, additional usage of staff PPE, and extensive cleaning of exam room while observing appropriate contact time as indicated for disinfecting solutions.   I'm seeing this patient by the request  of:  Copland, Gay Filler, MD  CC: Left hip pain  QQI:[id number]  Kathy King is a 64 y.o. female coming in with complaint of left hip pain. Last seen 06/22/2018 for left foot pain. Patient states she woke up with sharp pain. History of IT band issues and bursitis. Received a depo shot and meloxicam and oxycodone for pain. Received a prednisone injection in her glut. TTP down IT band. Has been walking more for the past 3 weeks. States she can feel the IT band pulling.   Onset- Monday Location - lateral  Duration-  Character- sharp  Aggravating factors- sitting  Reliving factors-  Therapies tried- ice, heat, oxycodone (has discontinued), tylenol, meloxicam  Severity- 5/10 at its worse      Past Medical History:  Diagnosis Date  . Adenomatous colon polyp    tubular  . CHICKENPOX, HX OF   . OSTEOPENIA   . SCIATICA, LEFT 08/09/2009   s/p PT, NSAIDs  . Unspecified vitamin D deficiency    Past Surgical History:  Procedure Laterality Date  . Arthroscopic shoulder  2008  . CERVICAL FUSION  2006  . CESAREAN SECTION  88 & 93   x's 2  . TONSILLECTOMY  1965   Social History   Socioeconomic History  . Marital status: Married    Spouse name: Not on file  . Number of children: Not on file  . Years of education: Not on file  . Highest education level: Not on file  Occupational History  . Not on file  Tobacco Use  . Smoking status: Never Smoker  . Smokeless tobacco:  Never Used  Substance and Sexual Activity  . Alcohol use: No    Alcohol/week: 0.0 standard drinks  . Drug use: No  . Sexual activity: Not on file  Other Topics Concern  . Not on file  Social History Narrative   Married, lives with spouse and kids   Social Determinants of Health   Financial Resource Strain:   . Difficulty of Paying Living Expenses:   Food Insecurity:   . Worried About Charity fundraiser in the Last Year:   . Arboriculturist in the Last Year:   Transportation Needs:   . Film/video editor (Medical):   Marland Kitchen Lack of Transportation (Non-Medical):   Physical Activity:   . Days of Exercise per Week:   . Minutes of Exercise per Session:   Stress:   . Feeling of Stress :   Social Connections:   . Frequency of Communication with Friends and Family:   . Frequency of Social Gatherings with Friends and Family:   . Attends Religious Services:   . Active Member of Clubs or Organizations:   . Attends Archivist Meetings:   Marland Kitchen Marital Status:    Allergies  Allergen Reactions  . Sulfonamide Derivatives     REACTION: unspecified; as child   Family History  Problem Relation Age of Onset  . Memory loss Mother   . Hypertension Mother   .  Hyperlipidemia Mother   . Arthritis Other        grandparent  . Breast cancer Maternal Grandmother   . Seizures Son     Current Outpatient Medications (Endocrine & Metabolic):  .  predniSONE (DELTASONE) 50 MG tablet, 1 tablet by mouth daily    Current Outpatient Medications (Analgesics):  .  meloxicam (MOBIC) 15 MG tablet, Take 1 tablet (15 mg total) by mouth daily. Marland Kitchen  oxyCODONE (OXY IR/ROXICODONE) 5 MG immediate release tablet, Take 1 tablet (5 mg total) by mouth every 4 (four) hours as needed for severe pain.   Current Outpatient Medications (Other):  .  calcium gluconate 500 MG tablet, Take 500 mg by mouth daily.   .  Cholecalciferol (VITAMIN D) 2000 UNITS CAPS, Take by mouth.   .  traZODone (DESYREL) 50 MG  tablet, Take 1-2 tablets (50-100 mg total) by mouth at bedtime as needed for sleep. Marland Kitchen  gabapentin (NEURONTIN) 100 MG capsule, Take 2 capsules (200 mg total) by mouth at bedtime. Marland Kitchen  tiZANidine (ZANAFLEX) 4 MG tablet, Take 1 tablet (4 mg total) by mouth every 6 (six) hours as needed for muscle spasms.   Reviewed prior external information including notes and imaging from  primary care provider As well as notes that were available from care everywhere and other healthcare systems.  Past medical history, social, surgical and family history all reviewed in electronic medical record.  No pertanent information unless stated regarding to the chief complaint.   Review of Systems:  No headache, visual changes, nausea, vomiting, diarrhea, constipation, dizziness, abdominal pain, skin rash, fevers, chills, night sweats, weight loss, swollen lymph nodes, body aches, joint swelling, chest pain, shortness of breath, mood changes. POSITIVE muscle aches  Objective  Blood pressure 100/80, pulse 77, height 5\' 3"  (1.6 m), weight 185 lb (83.9 kg), SpO2 97 %.   General: No apparent distress alert and oriented x3 mood and affect normal, dressed appropriately.  HEENT: Pupils equal, extraocular movements intact  Respiratory: Patient's speak in full sentences and does not appear short of breath  Cardiovascular: No lower extremity edema, non tender, no erythema  Neuro: Cranial nerves II through XII are intact, neurovascularly intact in all extremities with 2+ DTRs and 2+ pulses.  Gait normal with good balance and coordination.  MSK left hip exam is minorly tender to palpation over the lateral joint space.  Positive Ober test.  Severe tenderness to palpation in the L5-S1 area on the left side. No weakness of the lower extremities and deep tendon reflexes are intact.  Patient has tightness with Corky Sox but no significant pain compared to the straight leg test   Impression and Recommendations:     The above  documentation has been reviewed and is accurate and complete Lyndal Pulley, DO       Note: This dictation was prepared with Dragon dictation along with smaller phrase technology. Any transcriptional errors that result from this process are unintentional.

## 2019-10-20 NOTE — Assessment & Plan Note (Signed)
I believe the patient is actually having more of a lumbar radiculopathy.  X-rays of the back and hip ordered today.  Prednisone, discontinue the meloxicam, gabapentin and Zanaflex given.  Discussed with patient that if worsening symptoms such as weakness of the lower extremity patient will need the possibility of advanced imaging.  Patient is in agreement with the plan.  Follow-up with me again in 2 to 3 weeks need to consider possible formal physical therapy as well.

## 2019-10-20 NOTE — Patient Instructions (Addendum)
Prednisone daily for 5 days  Gabapentin 200 mg Zanaflex up to 3 times a day Out of work until Monday  Exercise 3 times a week Xray See me again in 2-3 weeks in 1 week call me

## 2019-11-01 ENCOUNTER — Telehealth: Payer: Self-pay

## 2019-11-01 NOTE — Telephone Encounter (Signed)
Pt has called the sleep lab to cancel HST on 11/09/2019, stating she is sleeping much better now and husband isn't complaining of her snoring either.

## 2019-11-11 MED FILL — tiZANidine HCL 4 MG TABS: 4 | 8 days supply | Qty: 30 | Fill #1

## 2019-11-17 ENCOUNTER — Encounter: Payer: Self-pay | Admitting: Family Medicine

## 2019-11-17 ENCOUNTER — Ambulatory Visit: Payer: No Typology Code available for payment source | Admitting: Family Medicine

## 2019-11-17 ENCOUNTER — Other Ambulatory Visit: Payer: Self-pay

## 2019-11-17 DIAGNOSIS — M5416 Radiculopathy, lumbar region: Secondary | ICD-10-CM

## 2019-11-17 NOTE — Patient Instructions (Addendum)
Good to see you Continue exercises  Increase gabpentin 300mg  at night When doing yard work do stretches after Stay well hydrated Eat with in 30 min of exercising  Do not lace last eye on [place]oka shoes  See me again in 6 weeks if not perfect

## 2019-11-17 NOTE — Progress Notes (Signed)
[facility] [address] [place] Phone: [phone] Subjective:   I Kathy King am serving as a Education administrator for Dr. Hulan Saas.  This visit occurred during the SARS-CoV-2 public health emergency.  Safety protocols were in place, including screening questions prior to the visit, additional usage of staff PPE, and extensive cleaning of exam room while observing appropriate contact time as indicated for disinfecting solutions.   I'm seeing this patient by the request  of:  Copland, Gay Filler, MD  CC: Low back pain follow-up  [id number]  Kathy King is a 64 y.o. female coming in with complaint of low back pain follow-up.Found to have more of a lumbar radiculopathy. Patient states she is about 85% better. Doing a lot better. Still taking gabapentin at night. Stretching some but not 3 times a week (2 times a week). Some left leg pain. Numbness in the medial left thigh. States it feels like sunburn.     Patient did have x-rays at last exam on October 20, 2019.  These were independently visualized by me showing the patient did have degenerative disc disease and facet hypertrophic changes.  Grade 1 anterior listhesis at L3-L4. Past Medical History:  Diagnosis Date  . Adenomatous colon polyp    tubular  . CHICKENPOX, HX OF   . OSTEOPENIA   . SCIATICA, LEFT 08/09/2009   s/p PT, NSAIDs  . Unspecified vitamin D deficiency    Past Surgical History:  Procedure Laterality Date  . Arthroscopic shoulder  2008  . CERVICAL FUSION  2006  . CESAREAN SECTION  88 & 93   x's 2  . TONSILLECTOMY  1965   Social History   Socioeconomic History  . Marital status: Married    Spouse name: Not on file  . Number of children: Not on file  . Years of education: Not on file  . Highest education level: Not on file  Occupational History  . Not on file  Tobacco Use  . Smoking status: Never Smoker  . Smokeless tobacco: Never Used  Substance and Sexual  Activity  . Alcohol use: No    Alcohol/week: 0.0 standard drinks  . Drug use: No  . Sexual activity: Not on file  Other Topics Concern  . Not on file  Social History Narrative   Married, lives with spouse and kids   Social Determinants of Health   Financial Resource Strain:   . Difficulty of Paying Living Expenses:   Food Insecurity:   . Worried About Charity fundraiser in the Last Year:   . Arboriculturist in the Last Year:   Transportation Needs:   . Film/video editor (Medical):   Marland Kitchen Lack of Transportation (Non-Medical):   Physical Activity:   . Days of Exercise per Week:   . Minutes of Exercise per Session:   Stress:   . Feeling of Stress :   Social Connections:   . Frequency of Communication with Friends and Family:   . Frequency of Social Gatherings with Friends and Family:   . Attends Religious Services:   . Active Member of Clubs or Organizations:   . Attends Archivist Meetings:   Marland Kitchen Marital Status:    Allergies  Allergen Reactions  . Sulfonamide Derivatives     REACTION: unspecified; as child   Family History  Problem Relation Age of Onset  . Memory loss Mother   . Hypertension Mother   . Hyperlipidemia Mother   .  Arthritis Other        grandparent  . Breast cancer Maternal Grandmother   . Seizures Son     Current Outpatient Medications (Endocrine & Metabolic):  .  predniSONE (DELTASONE) 50 MG tablet, 1 tablet by mouth daily    Current Outpatient Medications (Analgesics):  .  meloxicam (MOBIC) 15 MG tablet, Take 1 tablet (15 mg total) by mouth daily. Marland Kitchen  oxyCODONE (OXY IR/ROXICODONE) 5 MG immediate release tablet, Take 1 tablet (5 mg total) by mouth every 4 (four) hours as needed for severe pain.   Current Outpatient Medications (Other):  .  calcium gluconate 500 MG tablet, Take 500 mg by mouth daily.   .  Cholecalciferol (VITAMIN D) 2000 UNITS CAPS, Take by mouth.   .  gabapentin (NEURONTIN) 100 MG capsule, Take 2 capsules (200 mg  total) by mouth at bedtime. Marland Kitchen  tiZANidine (ZANAFLEX) 4 MG tablet, Take 1 tablet (4 mg total) by mouth every 6 (six) hours as needed for muscle spasms. .  traZODone (DESYREL) 50 MG tablet, Take 1-2 tablets (50-100 mg total) by mouth at bedtime as needed for sleep.   Reviewed prior external information including notes and imaging from  primary care provider As well as notes that were available from care everywhere and other healthcare systems.  Past medical history, social, surgical and family history all reviewed in electronic medical record.  No pertanent information unless stated regarding to the chief complaint.   Review of Systems:  No headache, visual changes, nausea, vomiting, diarrhea, constipation, dizziness, abdominal pain, skin rash, fevers, chills, night sweats, weight loss, swollen lymph nodes, body aches, joint swelling, chest pain, shortness of breath, mood changes. POSITIVE muscle aches  Objective  Blood pressure 124/86, pulse 73, height 5\' 3"  (1.6 m), weight 188 lb (85.3 kg), SpO2 97 %.   General: No apparent distress alert and oriented x3 mood and affect normal, dressed appropriately.  HEENT: Pupils equal, extraocular movements intact  Respiratory: Patient's speak in full sentences and does not appear short of breath  Cardiovascular: No lower extremity edema, non tender, no erythema  Neuro: Cranial nerves II through XII are intact, neurovascularly intact in all extremities with 2+ DTRs and 2+ pulses.  Gait normal with good balance and coordination.  MSK:  Non tender with full range of motion and good stability and symmetric strength and tone of shoulders, elbows, wrist, hip, knee and ankles bilaterally.  Low back exam shows tightness with straight leg test on the left side but no longer having the radicular symptoms.  Does have tightness with FABER test as well.  Patient has 5 out of 5 strength.  Deep tendon reflexes intact.  Sitting more comfortably.   Impression and  Recommendations:     The above documentation has been reviewed and is accurate and complete Lyndal Pulley, DO       Note: This dictation was prepared with Dragon dictation along with smaller phrase technology. Any transcriptional errors that result from this process are unintentional.

## 2019-11-17 NOTE — Assessment & Plan Note (Signed)
Patient is making significant improvement at this time.  Has Zanaflex for breakthrough pain as well as encouraged to continue the gabapentin for now.  We discussed potentially going back to the meloxicam now that she is done with the prednisone.  Patient will encouraged to do the exercises on a regular basis.  Worsening symptoms will consider the possibility of MRI but I think patient will do well with conservative therapy follow-up again in 6 to 8 weeks

## 2020-01-05 ENCOUNTER — Ambulatory Visit: Payer: No Typology Code available for payment source | Admitting: Family Medicine

## 2020-01-12 MED FILL — GABAPENTIN 100 MG CAPSULE: 100 | 90 days supply | Qty: 180 | Fill #1

## 2020-01-13 ENCOUNTER — Other Ambulatory Visit: Payer: Self-pay | Admitting: Family Medicine

## 2020-01-13 DIAGNOSIS — Z1231 Encounter for screening mammogram for malignant neoplasm of breast: Secondary | ICD-10-CM

## 2020-03-01 ENCOUNTER — Ambulatory Visit: Payer: No Typology Code available for payment source

## 2020-03-06 ENCOUNTER — Ambulatory Visit (INDEPENDENT_AMBULATORY_CARE_PROVIDER_SITE_OTHER): Payer: No Typology Code available for payment source | Admitting: Family

## 2020-03-06 ENCOUNTER — Other Ambulatory Visit: Payer: Self-pay

## 2020-03-06 VITALS — Ht 63.0 in | Wt 187.0 lb

## 2020-03-06 DIAGNOSIS — R197 Diarrhea, unspecified: Secondary | ICD-10-CM | POA: Diagnosis not present

## 2020-03-06 NOTE — Progress Notes (Signed)
Subjective:    Patient ID: Kathy King, female    DOB: 1955-06-07, 64 y.o.   MRN: [id number]  HPI  Reports hx of intermittent bouts of diarrhea.  States that she has had issues with this her entire life, but recently her diarrhea has been daily for about the last 8 days.  She had some GERD symptoms on 10/31 had gerd- drank some baking soda water and the following day took some nexium. Notes that her gerd symptoms improved. She then developed really loose stools on Monday.  Since that time she has had mostly watery stools.  Notes that this can be triggered by stress.  States that she had an incident last Monday with her mom and this triggered diarrhea.  Plans to go out of town this weekend to [place] which she is excited but stressed about.  She reports that she takes a daily probiotic and also tried some saurkraut without improve.  Denies associated pain.  Denies black or blood or bloody stools. Denies associated nausea or vomting. No recent antibiotic use.  No recent travel.     Review of Systems    see HPI  Past Medical History:  Diagnosis Date  . Adenomatous colon polyp    tubular  . CHICKENPOX, HX OF   . OSTEOPENIA   . SCIATICA, LEFT 08/09/2009   s/p PT, NSAIDs  . Unspecified vitamin D deficiency      Social History   Socioeconomic History  . Marital status: Married    Spouse name: Not on file  . Number of children: Not on file  . Years of education: Not on file  . Highest education level: Not on file  Occupational History  . Not on file  Tobacco Use  . Smoking status: Never Smoker  . Smokeless tobacco: Never Used  Substance and Sexual Activity  . Alcohol use: No    Alcohol/week: 0.0 standard drinks  . Drug use: No  . Sexual activity: Not on file  Other Topics Concern  . Not on file  Social History Narrative   Married, lives with spouse and kids   Social Determinants of Health   Financial Resource Strain:   . Difficulty of Paying Living Expenses: Not on file    Food Insecurity:   . Worried About Charity fundraiser in the Last Year: Not on file  . Ran Out of Food in the Last Year: Not on file  Transportation Needs:   . Lack of Transportation (Medical): Not on file  . Lack of Transportation (Non-Medical): Not on file  Physical Activity:   . Days of Exercise per Week: Not on file  . Minutes of Exercise per Session: Not on file  Stress:   . Feeling of Stress : Not on file  Social Connections:   . Frequency of Communication with Friends and Family: Not on file  . Frequency of Social Gatherings with Friends and Family: Not on file  . Attends Religious Services: Not on file  . Active Member of Clubs or Organizations: Not on file  . Attends Archivist Meetings: Not on file  . Marital Status: Not on file  Intimate Partner Violence:   . Fear of Current or Ex-Partner: Not on file  . Emotionally Abused: Not on file  . Physically Abused: Not on file  . Sexually Abused: Not on file    Past Surgical History:  Procedure Laterality Date  . Arthroscopic shoulder  2008  . CERVICAL FUSION  2006  .  CESAREAN SECTION  88 & 93   x's 2  . TONSILLECTOMY  1965    Family History  Problem Relation Age of Onset  . Memory loss Mother   . Hypertension Mother   . Hyperlipidemia Mother   . Arthritis Other        grandparent  . Breast cancer Maternal Grandmother   . Seizures Son     Allergies  Allergen Reactions  . Sulfonamide Derivatives     REACTION: unspecified; as child    Current Outpatient Medications on File Prior to Visit  Medication Sig Dispense Refill  . calcium gluconate 500 MG tablet Take 500 mg by mouth daily.      . Cholecalciferol (VITAMIN D) 2000 UNITS CAPS Take by mouth.      . gabapentin (NEURONTIN) 100 MG capsule Take 2 capsules (200 mg total) by mouth at bedtime. 180 capsule 3  . meloxicam (MOBIC) 15 MG tablet Take 1 tablet (15 mg total) by mouth daily. 30 tablet 0  . oxyCODONE (OXY IR/ROXICODONE) 5 MG immediate  release tablet Take 1 tablet (5 mg total) by mouth every 4 (four) hours as needed for severe pain. 30 tablet 0  . predniSONE (DELTASONE) 50 MG tablet 1 tablet by mouth daily 5 tablet 0  . tiZANidine (ZANAFLEX) 4 MG tablet Take 1 tablet (4 mg total) by mouth every 6 (six) hours as needed for muscle spasms. 30 tablet 1  . traZODone (DESYREL) 50 MG tablet Take 1-2 tablets (50-100 mg total) by mouth at bedtime as needed for sleep. 60 tablet 6   No current facility-administered medications on file prior to visit.    Ht 5\' 3"  (1.6 m)   Wt 187 lb (84.8 kg)   SpO2 99%   BMI 33.13 kg/m    Objective:   Physical Exam Constitutional:      Appearance: She is well-developed.  Neck:     Thyroid: No thyromegaly.  Cardiovascular:     Rate and Rhythm: Normal rate and regular rhythm.     Heart sounds: Normal heart sounds. No murmur heard.   Pulmonary:     Effort: Pulmonary effort is normal. No respiratory distress.     Breath sounds: Normal breath sounds. No wheezing.  Abdominal:     General: There is no distension.     Palpations: Abdomen is soft. There is no mass.     Tenderness: There is no abdominal tenderness. There is no guarding or rebound.  Musculoskeletal:     Cervical back: Neck supple.  Skin:    General: Skin is warm and dry.  Neurological:     Mental Status: She is alert and oriented to person, place, and time.  Psychiatric:        Behavior: Behavior normal.        Thought Content: Thought content normal.        Judgment: Judgment normal.           Assessment & Plan:  Diarrhea- suspect IBS as cause.  She is up to date on her colonoscopy.  Recommended that she complete stool culture and stool for c diff.  She can use immodium as needed.  If symptoms worsen or fail to improve we discussed that next step would be GI referral.   This visit occurred during the SARS-CoV-2 public health emergency.  Safety protocols were in place, including screening questions prior to the visit,  additional usage of staff PPE, and extensive cleaning of exam room while observing appropriate contact time  as indicated for disinfecting solutions.

## 2020-03-06 NOTE — Patient Instructions (Signed)
Please complete stool studies and return at your earliest convenience. You may use imodium as needed.

## 2020-03-15 ENCOUNTER — Ambulatory Visit
Admission: RE | Admit: 2020-03-15 | Discharge: 2020-03-15 | Disposition: A | Discharge status: Home / Self Care | Payer: No Typology Code available for payment source | Source: Ambulatory Visit | Attending: Family Medicine | Admitting: Family Medicine

## 2020-03-15 ENCOUNTER — Other Ambulatory Visit: Payer: Self-pay

## 2020-03-15 DIAGNOSIS — Z1231 Encounter for screening mammogram for malignant neoplasm of breast: Secondary | ICD-10-CM

## 2020-04-18 ENCOUNTER — Encounter: Payer: Self-pay | Admitting: Family Medicine

## 2020-04-18 ENCOUNTER — Other Ambulatory Visit: Payer: Self-pay

## 2020-04-18 ENCOUNTER — Ambulatory Visit (INDEPENDENT_AMBULATORY_CARE_PROVIDER_SITE_OTHER): Payer: No Typology Code available for payment source | Admitting: Family Medicine

## 2020-04-18 DIAGNOSIS — D489 Neoplasm of uncertain behavior, unspecified: Secondary | ICD-10-CM

## 2020-04-18 NOTE — Progress Notes (Signed)
Chief Complaint  Patient presents with  . Procedure    Kathy King is a 64 y.o. female here for a skin complaint.  Duration: several years Location: R posterior neck Pruritic? Yes Painful? No Drainage? No New soaps/lotions/topicals/detergents? No Sick contacts? No Other associated symptoms: feels it may be changing Therapies tried thus far: none  Past Medical History:  Diagnosis Date  . Adenomatous colon polyp    tubular  . CHICKENPOX, HX OF   . OSTEOPENIA   . SCIATICA, LEFT 08/09/2009   s/p PT, NSAIDs  . Unspecified vitamin D deficiency    Gen: awake, alert, appearing stated age Lungs: No accessory muscle use Skin: There is a hyperpigmented and raised dome-shaped lesion measuring 0.4 x 0.3 cm in diameter. No drainage, erythema, TTP, fluctuance, excoriation Psych: Age appropriate judgment and insight  Procedure note; shave biopsy Informed consent was obtained. The area was cleaned with alcohol and injected with 0.75 mL of 1% lidocaine with epinephrine. A Dermablade was slightly bent and used to cut under the area of interest. The specimen was placed in a sterile specimen cup and sent to the lab. The area was then cauterized ensuring adequate hemostasis. The area was dressed with triple antibiotic ointment and a bandage. There were no complications noted. The patient tolerated the procedure well.  Neoplasm of uncertain behavior - Plan: Surgical pathology( [facility]/ [facility]), PR SHAV SKIN LES < 0.5 CM TRUNK,ARM,LEG  Orders as above. Aftercare instructions verbalized and written down. Warning signs and symptoms verbalized and written down in AVS.  F/u prn. The patient voiced understanding and agreement to the plan.  [place], DO 04/18/20 11:55 AM

## 2020-04-18 NOTE — Patient Instructions (Signed)
Do not shower for the rest of the day. When you do wash it, use only soap and water. Do not vigorously scrub. Apply triple antibiotic ointment (like Neosporin) twice daily. Keep the area clean and dry.   Things to look out for: increasing pain not relieved by ibuprofen/acetaminophen, fevers, spreading redness, drainage of pus, or foul odor.  Give us a week to get the results of your biopsy back.   Let us know if you need anything.  

## 2020-06-08 ENCOUNTER — Encounter: Payer: Self-pay | Admitting: Family Medicine

## 2020-06-08 ENCOUNTER — Other Ambulatory Visit: Payer: Self-pay | Admitting: Family Medicine

## 2020-06-08 ENCOUNTER — Other Ambulatory Visit: Payer: Self-pay

## 2020-06-08 ENCOUNTER — Ambulatory Visit (INDEPENDENT_AMBULATORY_CARE_PROVIDER_SITE_OTHER): Payer: No Typology Code available for payment source | Admitting: Family Medicine

## 2020-06-08 VITALS — BP 122/70 | HR 66 | Temp 98.4°F | Resp 16 | Ht 63.0 in | Wt 183.0 lb

## 2020-06-08 DIAGNOSIS — F418 Other specified anxiety disorders: Secondary | ICD-10-CM

## 2020-06-08 MED ORDER — CLONAZEPAM 0.5 MG PO TABS: 0.5000 mg | ORAL_TABLET | Freq: Two times a day (BID) | ORAL | 1 refills | Status: DC | PRN

## 2020-06-08 MED ORDER — VENLAFAXINE HCL ER 37.5 MG PO CP24: 37.5000 mg | ORAL_CAPSULE | Freq: Every day | ORAL | 3 refills | Status: DC

## 2020-06-08 MED FILL — clonazePAM 0.5 MG TABS: 0.5 | 15 days supply | Qty: 30 | Fill #0

## 2020-06-08 MED FILL — VENLAFAXINE HCL ER 37.5 MG: 37.5 | 30 days supply | Qty: 30 | Fill #0

## 2020-06-08 NOTE — Patient Instructions (Addendum)
Aim to do some physical exertion for 150 minutes per week. This is typically divided into 5 days per week, 30 minutes per day. The activity should be enough to get your heart rate up. Anything is better than nothing if you have time constraints.  Please consider counseling. Contact [phone] to schedule an appointment or inquire about cost/insurance coverage.  Coping skills Choose 5 that work for you: Take a deep breath Count to 20 Read a book Do a puzzle Meditate Bake Sing Knit Garden Pray Go outside Call a friend Listen to music Take a walk Color Send a note Take a bath Watch a movie Be alone in a quiet place Pet an animal Visit a friend Journal Exercise Stretch   

## 2020-06-08 NOTE — Progress Notes (Signed)
Chief Complaint  Patient presents with  . Anxiety    Subjective: Patient is a 65 y.o. female here for anxiety. Here w spouse, Kathy King.  The patient has an elderly mother who recently fractured her hip.  Around a week and a half ago, she was admitted and is currently in rehab.  Her mother's mental status has deteriorated even prior to the fall.  She is dealing with a lot regarding getting her set up with palliative care and possibly hospice.  There is a lack of clarity regarding her specific diagnosis regarding her mental status.  The combination of all these factors has frustrated the patient and she is starting to have racing thoughts, insomnia, irritability, difficulty concentrating, feelings of losing control, decreased appetite, and anxiety.  No self medication.  No homicidal or suicidal ideation.  She is not following with a counselor or psychologist.  She has no history of generalized anxiety or major depressive disorder.  Past Medical History:  Diagnosis Date  . Adenomatous colon polyp    tubular  . CHICKENPOX, HX OF   . OSTEOPENIA   . SCIATICA, LEFT 08/09/2009   s/p PT, NSAIDs  . Unspecified vitamin D deficiency     Objective: BP 122/70   Pulse 66   Temp 98.4 F (36.9 C)   Resp 16   Ht 5\' 3"  (1.6 m)   Wt 183 lb (83 kg)   SpO2 98%   BMI 32.42 kg/m  General: Awake, appears stated age Lungs: No accessory muscle use Psych: Age appropriate judgment and insight, normal affect and mood  Assessment and Plan: Situational anxiety - Plan: venlafaxine XR (EFFEXOR XR) 37.5 MG 24 hr capsule, clonazePAM (KLONOPIN) 0.5 MG tablet   Start Effexor 37.5 mg daily.  Klonopin 0.5 mg twice daily as needed.  I did warn her about the dependent nature of benzodiazepines in addition to drowsiness.  Behavioral health contact information provided.  Counseled on exercise.  Coping skills list provided. Follow-up in 6 weeks. The patient and her spouse voiced understanding and agreement to the  plan.  [place], DO 06/08/20  12:02 PM

## 2020-08-09 ENCOUNTER — Encounter: Payer: No Typology Code available for payment source | Admitting: Family Medicine

## 2020-08-18 NOTE — Progress Notes (Addendum)
[facility] [address], [place], [place] [address] [phone]  Date:  08/22/2020   Name:  Kathy King   DOB:  1955/12/11   MRN:  [id number]  PCP:  Darreld Mclean, MD    Chief Complaint: Annual Exam   History of Present Illness:  Kathy King is a 65 y.o. very pleasant female patient who presents with the following:  [name] is here today for physical exam.  Last seen by myself in May of last year- history of osteopenia and vitamin D deficiency, stress  She lost her mother within the last few weeks which was very difficult- the estate will be closed soon, her home is under contract for sale  Her husband also had a cervical fusion 3 days after she passed away   She was using effexor for a while but stopped taking this.  She feels like she is doing ok on her own at this point   Pap can be updated- pt declines today  COVID-19 booster- encouraged her to do this  Mammogram up-to-date Colon cancer screen up-to-date Can update routine labs today DEXA scan done January 2021, osteopenia Shingrix- done   She did eat some cereal this am - not fasting   She has been having some lower back pain which started about a year ago after moving a lot of mulch She saw Dr Tamala Julian last year, had x-rays She back did get better, but she will have pain off and on that can be severe at times  She will take muscle relaxer, mobic, gabapentin as needed  After work she has quite severe pain- she will use a hot bath which helps some   She would like to work harder on her strength and fitness but she is limited by time No numbness or pain in her legs   Lower back films from 6/21: IMPRESSION: 1. Stable radiographic appearance of the lumbar spine since November 2018. Degenerative disc disease and facet hypertrophy in the lower lumbar spine with scoliotic curvature, unchanged from prior exam. 2. Grade 1 anterolisthesis of  L3 on L4 is unchanged.  Patient Active Problem List   Diagnosis Date Noted  . Osteopenia 05/12/2019  . Synovitis of left foot 05/25/2018  . Low back pain 12/12/2016  . Vitamin D deficiency   . Cough variant asthma   . Lumbar radiculopathy 10/22/2011  . SCIATICA, LEFT 08/09/2009  . Disorder of bone and cartilage 08/09/2009    Past Medical History:  Diagnosis Date  . Adenomatous colon polyp    tubular  . CHICKENPOX, HX OF   . OSTEOPENIA   . SCIATICA, LEFT 08/09/2009   s/p PT, NSAIDs  . Unspecified vitamin D deficiency     Past Surgical History:  Procedure Laterality Date  . Arthroscopic shoulder  2008  . CERVICAL FUSION  2006  . CESAREAN SECTION  88 & 93   x's 2  . TONSILLECTOMY  1965    Social History   Tobacco Use  . Smoking status: Never Smoker  . Smokeless tobacco: Never Used  Substance Use Topics  . Alcohol use: No    Alcohol/week: 0.0 standard drinks  . Drug use: No    Family History  Problem Relation Age of Onset  . Memory loss Mother   . Hypertension Mother   . Hyperlipidemia Mother   . Arthritis Other        grandparent  . Breast cancer Maternal  Grandmother   . Seizures Son     Allergies  Allergen Reactions  . Sulfonamide Derivatives     REACTION: unspecified; as child    Medication list has been reviewed and updated.  Current Outpatient Medications on File Prior to Visit  Medication Sig Dispense Refill  . calcium gluconate 500 MG tablet Take 500 mg by mouth daily.    . Cholecalciferol (VITAMIN D) 2000 UNITS CAPS Take by mouth.     No current facility-administered medications on file prior to visit.    Review of Systems:  As per HPI- otherwise negative.   Physical Examination: Vitals:   08/22/20 0731  BP: 124/68  Pulse: 81  Resp: 16  Temp: 98.2 F (36.8 C)  SpO2: 98%   Vitals:   08/22/20 0731  Weight: 188 lb 12.8 oz (85.6 kg)  Height: 5\' 3"  (1.6 m)   Body mass index is 33.44 kg/m. Ideal Body Weight: Weight in (lb) to  have BMI = 25: 140.8  GEN: no acute distress.  Overweight, looks well HEENT: Atraumatic, Normocephalic.  Bilateral TM wnl, oropharynx normal.  PEERL,EOMI.   Ears and Nose: No external deformity. CV: RRR, No M/G/R. No JVD. No thrill. No extra heart sounds. PULM: CTA B, no wheezes, crackles, rhonchi. No retractions. No resp. distress. No accessory muscle use. ABD: S, NT, ND, +BS. No rebound. No HSM. EXTR: No c/c/e PSYCH: Normally interactive. Conversant.  There is a small flat skin lesion on her right hip, most likely seborrheic keratosis. Verbal consent is obtained.  Cryotherapy with liquid nitrogen x3.  No complications, patient tolerated well  Assessment and Plan: Physical exam  Screening for diabetes mellitus - Plan: Comprehensive metabolic panel, Hemoglobin A1c  Screening for hyperlipidemia - Plan: Lipid panel  Screening for deficiency anemia - Plan: CBC  Vitamin D deficiency - Plan: VITAMIN D 25 Hydroxy (Vit-D Deficiency, Fractures)  Screening for thyroid disorder - Plan: TSH  Lumbar radiculopathy - Plan: meloxicam (MOBIC) 15 MG tablet, gabapentin (NEURONTIN) 100 MG capsule  Patient in today for physical exam.  She prefers to do her Pap next year. Reminded that bone density is due in the spring Labs are pending as above No longer taking Effexor or clonazepam, she feels that she has returned to her baseline following the death of her mother  Her main concern today is chronic lower back pain, this tends to get worse after a long day at work.  We discussed some strategies that may be helpful.  Dalisa knows that exercise such as yoga would be helpful, but she really does not have time to add anything else to her schedule. We will have her start on gabapentin at that time, she will try to take this consistently and see if that helps.  I also refilled her meloxicam to use as needed, she may also experiment with lidocaine patches Will plan further follow- up pending labs.   This  visit occurred during the SARS-CoV-2 public health emergency.  Safety protocols were in place, including screening questions prior to the visit, additional usage of staff PPE, and extensive cleaning of exam room while observing appropriate contact time as indicated for disinfecting solutions.    Signed Lamar Blinks, MD  Received her labs and sent message to pt Results for orders placed or performed in visit on 08/22/20  CBC  Result Value Ref Range   WBC 6.0 4.0 - 10.5 K/uL   RBC 4.16 3.87 - 5.11 Mil/uL   Platelets 217.0 150.0 - 400.0 K/uL  Hemoglobin 12.4 12.0 - 15.0 g/dL   HCT 37.4 36.0 - 46.0 %   MCV 89.8 78.0 - 100.0 fl   MCHC 33.1 30.0 - 36.0 g/dL   RDW 13.5 11.5 - 15.5 %  Comprehensive metabolic panel  Result Value Ref Range   Sodium 139 135 - 145 mEq/L   Potassium 4.1 3.5 - 5.1 mEq/L   Chloride 103 96 - 112 mEq/L   CO2 28 19 - 32 mEq/L   Glucose, Bld 100 (H) 70 - 99 mg/dL   BUN 16 6 - 23 mg/dL   Creatinine, Ser 0.84 0.40 - 1.20 mg/dL   Total Bilirubin 0.4 0.2 - 1.2 mg/dL   Alkaline Phosphatase 73 39 - 117 U/L   AST 21 0 - 37 U/L   ALT 27 0 - 35 U/L   Total Protein 6.0 6.0 - 8.3 g/dL   Albumin 4.2 3.5 - 5.2 g/dL   GFR 74.51 >60.00 mL/min   Calcium 9.2 8.4 - 10.5 mg/dL  Hemoglobin A1c  Result Value Ref Range   Hgb A1c MFr Bld 5.6 4.6 - 6.5 %  Lipid panel  Result Value Ref Range   Cholesterol 205 (H) 0 - 200 mg/dL   Triglycerides 91.0 0.0 - 149.0 mg/dL   HDL 73.10 >39.00 mg/dL   VLDL 18.2 0.0 - 40.0 mg/dL   LDL Cholesterol 114 (H) 0 - 99 mg/dL   Total CHOL/HDL Ratio 3    NonHDL 132.27   TSH  Result Value Ref Range   TSH 2.18 0.35 - 4.50 uIU/mL  VITAMIN D 25 Hydroxy (Vit-D Deficiency, Fractures)  Result Value Ref Range   VITD 39.21 30.00 - 100.00 ng/mL

## 2020-08-18 NOTE — Patient Instructions (Addendum)
It was great to see you again today, I will be in touch with your labs as soon as possible!  Try the gabapentin at bedtime for your back- start with 100 mg for a few days and then go to 200 mg Ok to use mobic as needed Try lidocaine patches as needed also Bone density due next year     Health Maintenance, Female Adopting a healthy lifestyle and getting preventive care are important in promoting health and wellness. Ask your health care provider about:  The right schedule for you to have regular tests and exams.  Things you can do on your own to prevent diseases and keep yourself healthy. What should I know about diet, weight, and exercise? Eat a healthy diet  Eat a diet that includes plenty of vegetables, fruits, low-fat dairy products, and lean protein.  Do not eat a lot of foods that are high in solid fats, added sugars, or sodium.   Maintain a healthy weight Body mass index (BMI) is used to identify weight problems. It estimates body fat based on height and weight. Your health care provider can help determine your BMI and help you achieve or maintain a healthy weight. Get regular exercise Get regular exercise. This is one of the most important things you can do for your health. Most adults should:  Exercise for at least 150 minutes each week. The exercise should increase your heart rate and make you sweat (moderate-intensity exercise).  Do strengthening exercises at least twice a week. This is in addition to the moderate-intensity exercise.  Spend less time sitting. Even light physical activity can be beneficial. Watch cholesterol and blood lipids Have your blood tested for lipids and cholesterol at 63 years of age, then have this test every 5 years. Have your cholesterol levels checked more often if:  Your lipid or cholesterol levels are high.  You are older than 63 years of age.  You are at high risk for heart disease. What should I know about cancer screening? Depending  on your health history and family history, you may need to have cancer screening at various ages. This may include screening for:  Breast cancer.  Cervical cancer.  Colorectal cancer.  Skin cancer.  Lung cancer. What should I know about heart disease, diabetes, and high blood pressure? Blood pressure and heart disease  High blood pressure causes heart disease and increases the risk of stroke. This is more likely to develop in people who have high blood pressure readings, are of African descent, or are overweight.  Have your blood pressure checked: ? Every 3-5 years if you are 52-65 years of age. ? Every year if you are 105 years old or older. Diabetes Have regular diabetes screenings. This checks your fasting blood sugar level. Have the screening done:  Once every three years after age 47 if you are at a normal weight and have a low risk for diabetes.  More often and at a younger age if you are overweight or have a high risk for diabetes. What should I know about preventing infection? Hepatitis B If you have a higher risk for hepatitis B, you should be screened for this virus. Talk with your health care provider to find out if you are at risk for hepatitis B infection. Hepatitis C Testing is recommended for:  Everyone born from [birth year] through 1963.  Anyone with known risk factors for hepatitis C. Sexually transmitted infections (STIs)  Get screened for STIs, including gonorrhea and chlamydia, if: ?  You are sexually active and are younger than 63 years of age. ? You are older than 63 years of age and your health care provider tells you that you are at risk for this type of infection. ? Your sexual activity has changed since you were last screened, and you are at increased risk for chlamydia or gonorrhea. Ask your health care provider if you are at risk.  Ask your health care provider about whether you are at high risk for HIV. Your health care provider may recommend a  prescription medicine to help prevent HIV infection. If you choose to take medicine to prevent HIV, you should first get tested for HIV. You should then be tested every 3 months for as long as you are taking the medicine. Pregnancy  If you are about to stop having your period (premenopausal) and you may become pregnant, seek counseling before you get pregnant.  Take 400 to 800 micrograms (mcg) of folic acid every day if you become pregnant.  Ask for birth control (contraception) if you want to prevent pregnancy. Osteoporosis and menopause Osteoporosis is a disease in which the bones lose minerals and strength with aging. This can result in bone fractures. If you are 55 years old or older, or if you are at risk for osteoporosis and fractures, ask your health care provider if you should:  Be screened for bone loss.  Take a calcium or vitamin D supplement to lower your risk of fractures.  Be given hormone replacement therapy (HRT) to treat symptoms of menopause. Follow these instructions at home: Lifestyle  Do not use any products that contain nicotine or tobacco, such as cigarettes, e-cigarettes, and chewing tobacco. If you need help quitting, ask your health care provider.  Do not use street drugs.  Do not share needles.  Ask your health care provider for help if you need support or information about quitting drugs. Alcohol use  Do not drink alcohol if: ? Your health care provider tells you not to drink. ? You are pregnant, may be pregnant, or are planning to become pregnant.  If you drink alcohol: ? Limit how much you use to 0-1 drink a day. ? Limit intake if you are breastfeeding.  Be aware of how much alcohol is in your drink. In the [place] one drink equals one 12 oz bottle of beer (355 mL), one 5 oz glass of wine (148 mL), or one 1 oz glass of hard liquor (44 mL). General instructions  Schedule regular health, dental, and eye exams.  Stay current with your  vaccines.  Tell your health care provider if: ? You often feel depressed. ? You have ever been abused or do not feel safe at home. Summary  Adopting a healthy lifestyle and getting preventive care are important in promoting health and wellness.  Follow your health care provider's instructions about healthy diet, exercising, and getting tested or screened for diseases.  Follow your health care provider's instructions on monitoring your cholesterol and blood pressure. This information is not intended to replace advice given to you by your health care provider. Make sure you discuss any questions you have with your health care provider. Document Revised: 04/07/2018 Document Reviewed: 04/07/2018 Elsevier Patient Education  2021 [employer].

## 2020-08-22 ENCOUNTER — Encounter: Payer: Self-pay | Admitting: Family Medicine

## 2020-08-22 ENCOUNTER — Other Ambulatory Visit: Payer: Self-pay

## 2020-08-22 ENCOUNTER — Ambulatory Visit (INDEPENDENT_AMBULATORY_CARE_PROVIDER_SITE_OTHER): Payer: No Typology Code available for payment source | Admitting: Family Medicine

## 2020-08-22 ENCOUNTER — Other Ambulatory Visit (HOSPITAL_BASED_OUTPATIENT_CLINIC_OR_DEPARTMENT_OTHER): Payer: Self-pay

## 2020-08-22 VITALS — BP 124/68 | HR 81 | Temp 98.2°F | Resp 16 | Ht 63.0 in | Wt 188.8 lb

## 2020-08-22 DIAGNOSIS — Z1329 Encounter for screening for other suspected endocrine disorder: Secondary | ICD-10-CM | POA: Diagnosis not present

## 2020-08-22 DIAGNOSIS — E559 Vitamin D deficiency, unspecified: Secondary | ICD-10-CM | POA: Diagnosis not present

## 2020-08-22 DIAGNOSIS — M5416 Radiculopathy, lumbar region: Secondary | ICD-10-CM

## 2020-08-22 DIAGNOSIS — Z131 Encounter for screening for diabetes mellitus: Secondary | ICD-10-CM | POA: Diagnosis not present

## 2020-08-22 DIAGNOSIS — Z130 Encounter for screening for diseases of the blood and blood-forming organs and certain disorders involving the immune mechanism: Secondary | ICD-10-CM | POA: Diagnosis not present

## 2020-08-22 DIAGNOSIS — Z13220 Encounter for screening for lipoid disorders: Secondary | ICD-10-CM | POA: Diagnosis not present

## 2020-08-22 DIAGNOSIS — Z0000 Encounter for general adult medical examination without abnormal findings: Secondary | ICD-10-CM

## 2020-08-22 LAB — CBC
HCT: 37.4 % (ref 36.0–46.0)
Hemoglobin: 12.4 g/dL (ref 12.0–15.0)
MCHC: 33.1 g/dL (ref 30.0–36.0)
MCV: 89.8 fl (ref 78.0–100.0)
Platelets: 217 10*3/uL (ref 150.0–400.0)
RBC: 4.16 Mil/uL (ref 3.87–5.11)
RDW: 13.5 % (ref 11.5–15.5)
WBC: 6 10*3/uL (ref 4.0–10.5)

## 2020-08-22 LAB — COMPREHENSIVE METABOLIC PANEL
ALT: 27 U/L (ref 0–35)
AST: 21 U/L (ref 0–37)
Albumin: 4.2 g/dL (ref 3.5–5.2)
Alkaline Phosphatase: 73 U/L (ref 39–117)
BUN: 16 mg/dL (ref 6–23)
CO2: 28 mEq/L (ref 19–32)
Calcium: 9.2 mg/dL (ref 8.4–10.5)
Chloride: 103 mEq/L (ref 96–112)
Creatinine, Ser: 0.84 mg/dL (ref 0.40–1.20)
GFR: 74.51 mL/min (ref >60.00)
Glucose, Bld: 100 mg/dL — ABNORMAL HIGH (ref 70–99)
Potassium: 4.1 mEq/L (ref 3.5–5.1)
Sodium: 139 mEq/L (ref 135–145)
Total Bilirubin: 0.4 mg/dL (ref 0.2–1.2)
Total Protein: 6 g/dL (ref 6.0–8.3)

## 2020-08-22 LAB — LIPID PANEL
Cholesterol: 205 mg/dL — ABNORMAL HIGH (ref 0–200)
HDL: 73.1 mg/dL (ref >39.00)
LDL Cholesterol: 114 mg/dL — ABNORMAL HIGH (ref 0–99)
NonHDL: 132.27
Total CHOL/HDL Ratio: 3
Triglycerides: 91 mg/dL (ref 0.0–149.0)
VLDL: 18.2 mg/dL (ref 0.0–40.0)

## 2020-08-22 LAB — VITAMIN D 25 HYDROXY (VIT D DEFICIENCY, FRACTURES): VITD: 39.21 ng/mL (ref 30.00–100.00)

## 2020-08-22 LAB — HEMOGLOBIN A1C: Hgb A1c MFr Bld: 5.6 % (ref 4.6–6.5)

## 2020-08-22 LAB — TSH: TSH: 2.18 u[IU]/mL (ref 0.35–4.50)

## 2020-08-22 MED ORDER — MELOXICAM 15 MG PO TABS
15.0000 mg | ORAL_TABLET | Freq: Every day | ORAL | 1 refills | Status: DC | PRN
  Filled 2020-08-22: qty 90, 90d supply, fill #0
  Filled 2020-12-25: qty 90, 90d supply, fill #1

## 2020-08-22 MED ORDER — GABAPENTIN 100 MG PO CAPS
200.0000 mg | ORAL_CAPSULE | Freq: Every day | ORAL | 1 refills | Status: DC
  Filled 2020-08-22: qty 180, 90d supply, fill #0
  Filled 2020-11-21: qty 180, 90d supply, fill #1

## 2020-08-29 ENCOUNTER — Other Ambulatory Visit (HOSPITAL_BASED_OUTPATIENT_CLINIC_OR_DEPARTMENT_OTHER): Payer: Self-pay

## 2020-08-29 ENCOUNTER — Encounter: Payer: Self-pay | Admitting: Family Medicine

## 2020-08-29 DIAGNOSIS — F418 Other specified anxiety disorders: Secondary | ICD-10-CM

## 2020-08-29 MED ORDER — VENLAFAXINE HCL ER 37.5 MG PO CP24
37.5000 mg | ORAL_CAPSULE | Freq: Every day | ORAL | 1 refills | Status: DC
  Filled 2020-08-29: qty 90, 90d supply, fill #0

## 2020-09-10 ENCOUNTER — Other Ambulatory Visit (HOSPITAL_BASED_OUTPATIENT_CLINIC_OR_DEPARTMENT_OTHER): Payer: Self-pay

## 2020-09-10 ENCOUNTER — Other Ambulatory Visit (HOSPITAL_COMMUNITY): Payer: Self-pay

## 2020-09-10 ENCOUNTER — Encounter: Payer: Self-pay | Admitting: Family Medicine

## 2020-09-10 ENCOUNTER — Other Ambulatory Visit: Payer: No Typology Code available for payment source

## 2020-09-10 ENCOUNTER — Other Ambulatory Visit: Payer: Self-pay

## 2020-09-10 ENCOUNTER — Telehealth (INDEPENDENT_AMBULATORY_CARE_PROVIDER_SITE_OTHER): Payer: No Typology Code available for payment source | Admitting: Family Medicine

## 2020-09-10 VITALS — Temp 99.8°F | Ht 63.0 in | Wt 188.0 lb

## 2020-09-10 DIAGNOSIS — R6889 Other general symptoms and signs: Secondary | ICD-10-CM

## 2020-09-10 MED ORDER — NIRMATRELVIR/RITONAVIR (PAXLOVID)TABLET
3.0000 | ORAL_TABLET | Freq: Two times a day (BID) | ORAL | 0 refills | Status: AC
  Filled 2020-09-10: qty 30, 5d supply, fill #0

## 2020-09-10 MED ORDER — BENZONATATE 100 MG PO CAPS
100.0000 mg | ORAL_CAPSULE | Freq: Three times a day (TID) | ORAL | 0 refills | Status: DC | PRN
  Filled 2020-09-10: qty 30, 10d supply, fill #0

## 2020-09-10 MED ORDER — PROMETHAZINE-CODEINE 6.25-10 MG/5ML PO SYRP
5.0000 mL | ORAL_SOLUTION | Freq: Four times a day (QID) | ORAL | 0 refills | Status: DC | PRN
  Filled 2020-09-10: qty 120, 6d supply, fill #0

## 2020-09-10 NOTE — Addendum Note (Signed)
Addended by: Ames Coupe on: 09/10/2020 04:48 PM   Modules accepted: Orders

## 2020-09-10 NOTE — Progress Notes (Signed)
Chief Complaint  Patient presents with  . Cough  . Fever    101.8 this am.    . Generalized Body Aches    Was exposed last Monday.  Home test neg. And got tested this am with [facility]     Kathy King here for URI complaints.  Duration: 3 days  Associated symptoms: Fever (101.8 F), sinus congestion, rhinorrhea, myalgia and cough Denies: sinus pain, itchy watery eyes, ear pain, ear drainage, sore throat, wheezing, shortness of breath and N/V/D Treatment to date: Tylenol, ibuprofen Sick contacts: No; exposed to covid potentially last week at work She tested neg at home for covid, PCR test this AM is pending. She is vaccinated, not boosted.  Past Medical History:  Diagnosis Date  . Adenomatous colon polyp    tubular  . CHICKENPOX, HX OF   . OSTEOPENIA   . SCIATICA, LEFT 08/09/2009   s/p PT, NSAIDs  . Unspecified vitamin D deficiency     Temp 99.8 F (37.7 C) (Oral)   Ht 5\' 3"  (1.6 m)   Wt 188 lb (85.3 kg)   BMI 33.30 kg/m  No conversational dyspnea Age appropriate judgment and insight Nml affect and mood  Flu-like symptoms - Plan: benzonatate (TESSALON) 100 MG capsule, promethazine-codeine (PHENERGAN WITH CODEINE) 6.25-10 MG/5ML syrup  Discussed paxlovid, she will think about it. Will send in should she test positive. Age is a risk factor.  If neg, will tx for PNA.  Continue to push fluids, practice good hand hygiene, cover mouth when coughing. F/u prn. If starting to experience irreplaceable fluid loss, shaking, or shortness of breath, seek immediate care. Pt voiced understanding and agreement to the plan.  [place], DO 09/10/20 10:48 AM

## 2020-09-11 ENCOUNTER — Other Ambulatory Visit (HOSPITAL_COMMUNITY): Payer: Self-pay

## 2020-09-11 ENCOUNTER — Other Ambulatory Visit (HOSPITAL_BASED_OUTPATIENT_CLINIC_OR_DEPARTMENT_OTHER): Payer: Self-pay

## 2020-09-21 ENCOUNTER — Other Ambulatory Visit: Payer: Self-pay | Admitting: Family Medicine

## 2020-09-21 ENCOUNTER — Other Ambulatory Visit (HOSPITAL_BASED_OUTPATIENT_CLINIC_OR_DEPARTMENT_OTHER): Payer: Self-pay

## 2020-09-21 MED ORDER — FLOVENT HFA 110 MCG/ACT IN AERO
2.0000 | INHALATION_SPRAY | Freq: Two times a day (BID) | RESPIRATORY_TRACT | 1 refills | Status: DC
  Filled 2020-09-21: qty 12, 30d supply, fill #0

## 2020-09-21 MED ORDER — FLUTICASONE PROPIONATE 50 MCG/ACT NA SUSP
2.0000 | Freq: Every day | NASAL | 6 refills | Status: DC
  Filled 2020-09-21: qty 16, 30d supply, fill #0

## 2020-10-01 ENCOUNTER — Other Ambulatory Visit: Payer: Self-pay | Admitting: Family Medicine

## 2020-10-01 ENCOUNTER — Other Ambulatory Visit (HOSPITAL_BASED_OUTPATIENT_CLINIC_OR_DEPARTMENT_OTHER): Payer: Self-pay

## 2020-10-01 MED ORDER — BENZONATATE 200 MG PO CAPS
200.0000 mg | ORAL_CAPSULE | Freq: Three times a day (TID) | ORAL | 1 refills | Status: DC | PRN
  Filled 2020-10-01: qty 30, 10d supply, fill #0

## 2020-10-26 ENCOUNTER — Other Ambulatory Visit: Payer: Self-pay

## 2020-10-26 ENCOUNTER — Ambulatory Visit (INDEPENDENT_AMBULATORY_CARE_PROVIDER_SITE_OTHER): Payer: No Typology Code available for payment source | Admitting: Family Medicine

## 2020-10-26 ENCOUNTER — Encounter: Payer: Self-pay | Admitting: Family Medicine

## 2020-10-26 ENCOUNTER — Other Ambulatory Visit: Payer: Self-pay | Admitting: Family Medicine

## 2020-10-26 VITALS — BP 116/84 | HR 73 | Temp 98.1°F | Resp 16 | Ht 63.0 in | Wt 185.0 lb

## 2020-10-26 DIAGNOSIS — S46811A Strain of other muscles, fascia and tendons at shoulder and upper arm level, right arm, initial encounter: Secondary | ICD-10-CM

## 2020-10-26 DIAGNOSIS — M25511 Pain in right shoulder: Secondary | ICD-10-CM | POA: Diagnosis not present

## 2020-10-26 NOTE — Patient Instructions (Signed)
Heat (pad or rice pillow in microwave) over affected area, 10-15 minutes twice daily.   Ice/cold pack over area for 10-15 min twice daily.  OK to take Tylenol 1000 mg (2 extra strength tabs) or 975 mg (3 regular strength tabs) every 6 hours as needed.  Continue Mobic and Tizanadine, may need to half it if too sedating.  Let us know if you need anything.  Acromioclavicular Separation Rehab It is normal to feel mild stretching, pulling, tightness, or discomfort as you do these exercises, but you should stop right away if you feel sudden pain or your pain gets worse.   Stretching and range of motion exercises These exercises warm up your muscles and joints and improve the movement and flexibility of your shoulder. These exercises also help to relieve pain and stiffness. Exercise A: Pendulum  Stand near a wall or a surface that you can hold onto for balance. Bend forward at the waist and let your left / right arm hang straight down. Use your other arm to keep your balance. Relax your arm and shoulder muscles, and move your hips and your trunk so your left / right arm swings freely. Your arm should swing because of the motion of your body, not because you are using your arm or shoulder muscles. Keep moving so your arm swings in the following directions, as told by your health care provider: Side to side. Forward and backward. In clockwise and counterclockwise circles. Slowly return to the starting position. Repeat 2 times, or for 30 seconds per direction. Complete this exercise 3 times per week. Exercise B: Flexion, seated  Sit in a stable chair and rest your left / right forearm on a flat surface. Your elbow should rest at a height that keeps your upper arm next to your body. Keeping your left / right shoulder relaxed, lean forward at the waist and slide your hand forward until you feel a stretch in your shoulder. You can move your chair farther away from the surface to increase the stretch,  if needed. Hold for 30 seconds. Slowly return to the starting position. Repeat 2 times. Complete this exercise 3 times per week.  Strengthening exercises These exercises build strength and endurance in your shoulder. Endurance is the ability to use your muscles for a long time, even after they get tired. Exercise C: Scapular retraction  Sit in a stable chair without armrests, or stand. Secure an exercise band to a stable object in front of you so the band is at shoulder height. Hold one end of the exercise band in each hand. Squeeze your shoulder blades together and move your elbows slightly behind you. Do not shrug your shoulders while you do this. Hold for 3 seconds. Slowly return to the starting position. Repeat 2 times. Complete this exercise 3 times per week. Exercise D: Shoulder abduction Sit in a stable chair without arm rests, or stand. Hold a 5 weight in your left / right hand. Start with your arms straight down. Turn your left / right hand so your palm faces in, toward your body. Slowly lift your right / left hand out to your side. Do not lift your hand above shoulder height. Keep your arms straight. Avoid shrugging your shoulder while you do this movement. Keep your shoulder blade tucked down toward the middle of your back. Hold for 3 seconds. Slowly lower your arm and return to the starting position. Repeat 2 times. Complete this exercise 3 times per week. Exercise E: Scapular protraction,  standing  Stand so you are facing a wall. Place your feet about one arm-length away from the wall. Place your hands on the wall and straighten your elbows. Keep your hands on the wall as you push your upper back away from the wall. You should feel your shoulder blades sliding forward around your rib cage. Keep your elbows and your head still. Hold for 3 seconds. Slowly return to the starting position. Let your muscles relax completely before you repeat this exercise. Repeat 2 times.  Complete this exercise 3 times per week.  This information is not intended to replace advice given to you by your health care provider. Make sure you discuss any questions you have with your health care provider. Document Released: 04/14/2005 Document Revised: 12/20/2015 Document Reviewed: 03/17/2015 Elsevier Interactive Patient Education  2018 [employer].  Trapezius stretches/exercises Do exercises exactly as told by your health care provider and adjust them as directed. It is normal to feel mild stretching, pulling, tightness, or discomfort as you do these exercises, but you should stop right away if you feel sudden pain or your pain gets worse.   Stretching and range of motion exercises These exercises warm up your muscles and joints and improve the movement and flexibility of your shoulder. These exercises can also help to relieve pain, numbness, and tingling. If you are unable to do any of the following for any reason, do not further attempt to do it.   Exercise A: Flexion, standing     Stand and hold a broomstick, a cane, or a similar object. Place your hands a little more than shoulder-width apart on the object. Your left / right hand should be palm-up, and your other hand should be palm-down. Push the stick to raise your left / right arm out to your side and then over your head. Use your other hand to help move the stick. Stop when you feel a stretch in your shoulder, or when you reach the angle that is recommended by your health care provider. Avoid shrugging your shoulder while you raise your arm. Keep your shoulder blade tucked down toward your spine. Hold for 30 seconds. Slowly return to the starting position. Repeat 2 times. Complete this exercise 3 times per week.  Exercise B: Abduction, supine     Lie on your back and hold a broomstick, a cane, or a similar object. Place your hands a little more than shoulder-width apart on the object. Your left / right hand should be  palm-up, and your other hand should be palm-down. Push the stick to raise your left / right arm out to your side and then over your head. Use your other hand to help move the stick. Stop when you feel a stretch in your shoulder, or when you reach the angle that is recommended by your health care provider. Avoid shrugging your shoulder while you raise your arm. Keep your shoulder blade tucked down toward your spine. Hold for 30 seconds. Slowly return to the starting position. Repeat 2 times. Complete this exercise 3 times per week.  Exercise C: Flexion, active-assisted     Lie on your back. You may bend your knees for comfort. Hold a broomstick, a cane, or a similar object. Place your hands about shoulder-width apart on the object. Your palms should face toward your feet. Raise the stick and move your arms over your head and behind your head, toward the floor. Use your healthy arm to help your left / right arm move farther.  Stop when you feel a gentle stretch in your shoulder, or when you reach the angle where your health care provider tells you to stop. Hold for 30 seconds. Slowly return to the starting position. Repeat 2 times. Complete this exercise 3 times per week.  Exercise D: External rotation and abduction     Stand in a door frame with one of your feet slightly in front of the other. This is called a staggered stance. Choose one of the following positions as told by your health care provider: Place your hands and forearms on the door frame above your head. Place your hands and forearms on the door frame at the height of your head. Place your hands on the door frame at the height of your elbows. Slowly move your weight onto your front foot until you feel a stretch across your chest and in the front of your shoulders. Keep your head and chest upright and keep your abdominal muscles tight. Hold for 30 seconds. To release the stretch, shift your weight to your back foot. Repeat 2  times. Complete this stretch 3 times per week.  Strengthening exercises These exercises build strength and endurance in your shoulder. Endurance is the ability to use your muscles for a long time, even after your muscles get tired. Exercise E: Scapular depression and adduction  Sit on a stable chair. Support your arms in front of you with pillows, armrests, or a tabletop. Keep your elbows in line with the sides of your body. Gently move your shoulder blades down toward your middle back. Relax the muscles on the tops of your shoulders and in the back of your neck. Hold for 3 seconds. Slowly release the tension and relax your muscles completely before doing this exercise again. Repeat for a total of 10 repetitions. After you have practiced this exercise, try doing the exercise without the arm support. Then, try the exercise while standing instead of sitting. Repeat 2 times. Complete this exercise 3 times per week.  Exercise F: Shoulder abduction, isometric     Stand or sit about 4-6 inches (10-15 cm) from a wall with your left / right side facing the wall. Bend your left / right elbow and gently press your elbow against the wall. Increase the pressure slowly until you are pressing as hard as you can without shrugging your shoulder. Hold for 3 seconds. Slowly release the tension and relax your muscles completely. Repeat for a total of 10 repetitions. Repeat 2 times. Complete this exercise 3 times per week.  Exercise G: Shoulder flexion, isometric     Stand or sit about 4-6 inches (10-15 cm) away from a wall with your left / right side facing the wall. Keep your left / right elbow straight and gently press the top of your fist against the wall. Increase the pressure slowly until you are pressing as hard as you can without shrugging your shoulder. Hold for 10-15 seconds. Slowly release the tension and relax your muscles completely. Repeat for a total of 10 repetitions. Repeat 2 times.  Complete this exercise 3 times per week.  Exercise H: Internal rotation     Sit in a stable chair without armrests, or stand. Secure an exercise band at your left / right side, at elbow height. Place a soft object, such as a folded towel or a small pillow, under your left / right upper arm so your elbow is a few inches (about 8 cm) away from your side. Hold the end of the  exercise band so the band stretches. Keeping your elbow pressed against the soft object under your arm, move your forearm across your body toward your abdomen. Keep your body steady so the movement is only coming from your shoulder. Hold for 3 seconds. Slowly return to the starting position. Repeat for a total of 10 repetitions. Repeat 2 times. Complete this exercise 3 times per week.  Exercise I: External rotation     Sit in a stable chair without armrests, or stand. Secure an exercise band at your left / right side, at elbow height. Place a soft object, such as a folded towel or a small pillow, under your left / right upper arm so your elbow is a few inches (about 8 cm) away from your side. Hold the end of the exercise band so the band stretches. Keeping your elbow pressed against the soft object under your arm, move your forearm out, away from your abdomen. Keep your body steady so the movement is only coming from your shoulder. Hold for 3 seconds. Slowly return to the starting position. Repeat for a total of 10 repetitions. Repeat 2 times. Complete this exercise 3 times per week. Exercise J: Shoulder extension  Sit in a stable chair without armrests, or stand. Secure an exercise band to a stable object in front of you so the band is at shoulder height. Hold one end of the exercise band in each hand. Your palms should face each other. Straighten your elbows and lift your hands up to shoulder height. Step back, away from the secured end of the exercise band, until the band stretches. Squeeze your shoulder blades  together and pull your hands down to the sides of your thighs. Stop when your hands are straight down by your sides. Do not let your hands go behind your body. Hold for 3 seconds. Slowly return to the starting position. Repeat for a total of 10 repetitions. Repeat 2 times. Complete this exercise 3 times per week.  Exercise K: Shoulder extension, prone     Lie on your abdomen on a firm surface so your left / right arm hangs over the edge. Hold a 5 lb weight in your hand so your palm faces in toward your body. Your arm should be straight. Squeeze your shoulder blade down toward the middle of your back. Slowly raise your arm behind you, up to the height of the surface that you are lying on. Keep your arm straight. Hold for 3 seconds. Slowly return to the starting position and relax your muscles. Repeat for a total of 10 repetitions. Repeat 2 times. Complete this exercise 3 times per week.   Exercise L: Horizontal abduction, prone  Lie on your abdomen on a firm surface so your left / right arm hangs over the edge. Hold a 5 lb weight in your hand so your palm faces toward your feet. Your arm should be straight. Squeeze your shoulder blade down toward the middle of your back. Bend your elbow so your hand moves up, until your elbow is bent to an "L" shape (90 degrees). With your elbow bent, slowly move your forearm forward and up. Raise your hand up to the height of the surface that you are lying on. Your upper arm should not move, and your elbow should stay bent. At the top of the movement, your palm should face the floor. Hold for 3 seconds. Slowly return to the starting position and relax your muscles. Repeat for a total of 10 repetitions. Repeat  2 times. Complete this exercise 3 times per week.  Exercise M: Horizontal abduction, standing  Sit on a stable chair, or stand. Secure an exercise band to a stable object in front of you so the band is at shoulder height. Hold one end of the  exercise band in each hand. Straighten your elbows and lift your hands straight in front of you, up to shoulder height. Your palms should face down, toward the floor. Step back, away from the secured end of the exercise band, until the band stretches. Move your arms out to your sides, and keep your arms straight. Hold for 3 seconds. Slowly return to the starting position. Repeat for a total of 10 repetitions. Repeat 2 times. Complete this exercise 3 times per week.  Exercise N: Scapular retraction and elevation  Sit on a stable chair, or stand. Secure an exercise band to a stable object in front of you so the band is at shoulder height. Hold one end of the exercise band in each hand. Your palms should face each other. Sit in a stable chair without armrests, or stand. Step back, away from the secured end of the exercise band, until the band stretches. Squeeze your shoulder blades together and lift your hands over your head. Keep your elbows straight. Hold for 3 seconds. Slowly return to the starting position. Repeat for a total of 10 repetitions. Repeat 2 times. Complete this exercise 3 times per week.  This information is not intended to replace advice given to you by your health care provider. Make sure you discuss any questions you have with your health care provider. Document Released: 04/14/2005 Document Revised: 12/20/2015 Document Reviewed: 03/01/2015 Elsevier Interactive Patient Education  2017 [employer].

## 2020-10-26 NOTE — Progress Notes (Signed)
Musculoskeletal Exam  Patient: Kathy King DOB: Jan 16, 1956  DOS: 10/26/2020  SUBJECTIVE:  Chief Complaint:   Chief Complaint  Patient presents with   Shoulder Pain   Back Pain    Kathy King is a 65 y.o.  female for evaluation and treatment of R shoulder popping.   Onset:  4 days ago. No inj or change in activity.  Location: Top of the shoulder, points to [facility] joint She has no pain. Progression of issue:  is unchanged Associated symptoms: No bruising, redness, or swelling Treatment: to date has been: None Neurovascular symptoms: no  Right upper back pain The patient woke up yesterday morning with severe and sharp right upper back pain.  She took ibuprofen, tizanidine, and Tylenol without significant relief.  She also used heat which did help a little bit.  There is no specific injury or change in activity.  No redness, bruising, or swelling.  She does not feel it is related to her shoulder popping.  No neurologic signs or symptoms.  Past Medical History:  Diagnosis Date   Adenomatous colon polyp    tubular   CHICKENPOX, HX OF    OSTEOPENIA    SCIATICA, LEFT 08/09/2009   s/p PT, NSAIDs   Unspecified vitamin D deficiency     Objective: VITAL SIGNS: BP 116/84   Pulse 73   Temp 98.1 F (36.7 C)   Resp 16   Ht 5\' 3"  (1.6 m)   Wt 185 lb (83.9 kg)   SpO2 99%   BMI 32.77 kg/m  Constitutional: Well formed, well developed. No acute distress. Thorax & Lungs: No accessory muscle use Musculoskeletal: Right shoulder.   Normal active range of motion: yes.   Normal passive range of motion: yes Tenderness to palpation: Yes, over the right AC joint Deformity: no Ecchymosis: no Tests positive: Crossover O'Briens Tests negative: Crossover, O'Briens, Neer's, liftoff, Hawkins, empty can, Spurling's She also has tenderness to palpation over the caudal portion of the right trapezius/rhomboids on the right; there is no reproduced pain with scapular retraction there  was mild discomfort with shrugging her shoulders Neurologic: Normal sensory function. No focal deficits noted. DTR's equal and symmetric in UE's. No clonus. Psychiatric: Normal mood. Age appropriate judgment and insight. Alert & oriented x 3.    Assessment:  Arthralgia of right acromioclavicular joint  Strain of right trapezius muscle, initial encounter  Plan: Stretches/exercises, heat, ice, Tylenol.  Continue tizanidine and Mobic.  Consider injection of the [facility] joint if no improvement.  I think her upper back area will improve spontaneously. F/u prn. The patient voiced understanding and agreement to the plan.   [place], DO 10/26/20  9:41 AM

## 2020-11-01 ENCOUNTER — Other Ambulatory Visit (HOSPITAL_BASED_OUTPATIENT_CLINIC_OR_DEPARTMENT_OTHER): Payer: Self-pay

## 2020-11-21 ENCOUNTER — Other Ambulatory Visit (HOSPITAL_BASED_OUTPATIENT_CLINIC_OR_DEPARTMENT_OTHER): Payer: Self-pay

## 2020-12-24 ENCOUNTER — Ambulatory Visit (INDEPENDENT_AMBULATORY_CARE_PROVIDER_SITE_OTHER): Payer: No Typology Code available for payment source | Admitting: Family Medicine

## 2020-12-24 ENCOUNTER — Other Ambulatory Visit: Payer: Self-pay

## 2020-12-24 VITALS — BP 124/68 | HR 70 | Temp 98.1°F | Resp 17 | Ht 63.0 in | Wt 188.8 lb

## 2020-12-24 DIAGNOSIS — R232 Flushing: Secondary | ICD-10-CM

## 2020-12-24 NOTE — Patient Instructions (Signed)
Great to see you as always!   Let's double effexor to 75. Let me know how this works for you -can increase to 150 in a couple of weeks if you like

## 2020-12-24 NOTE — Progress Notes (Signed)
[facility] [place], [place], [place] [address] [phone]  Date:  12/24/2020   Name:  Kathy King   DOB:  Feb 10, 1956   MRN:  [id number]  PCP:  Darreld Mclean, MD    Chief Complaint: Hot Flashes   History of Present Illness:  Kathy King is a 65 y.o. 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 She used the estrogen patch for 3-4 months and then weaned off No bleeding since then She has continued to have hot flashes since menopause.  However, she has noted worsening of hot flashes - have been getting worse the last 6 months May occur several times a day She can feel it coming on-almost like an aura Do not tend to wake her up at night - she does use a fan and just a sheet to sleep, does not tend to be woken up with sweats It is worse if she is exposed to hot temps but can happen when she is in a cool setting and at rest   Wt Readings from Last 3 Encounters:  12/24/20 188 lb 12.8 oz (85.6 kg)  10/26/20 185 lb (83.9 kg)  09/10/20 188 lb (85.3 kg)     BP Readings from Last 3 Encounters:  12/24/20 124/68  10/26/20 116/84  08/22/20 124/68     Patient Active Problem List   Diagnosis Date Noted   Osteopenia 05/12/2019   Synovitis of left foot 05/25/2018   Low back pain 12/12/2016   Vitamin D deficiency    Cough variant asthma    Lumbar radiculopathy 10/22/2011   SCIATICA, LEFT 08/09/2009   Disorder of bone and cartilage 08/09/2009    Past Medical History:  Diagnosis Date   Adenomatous colon polyp     tubular   CHICKENPOX, HX OF    OSTEOPENIA    SCIATICA, LEFT 08/09/2009   s/p PT, NSAIDs   Unspecified vitamin D deficiency     Past Surgical History:  Procedure Laterality Date   Arthroscopic shoulder  2008   CERVICAL FUSION  2006   CESAREAN SECTION  89 & 93   x's 2   TONSILLECTOMY  1965    Social History   Tobacco Use   Smoking status: Never   Smokeless tobacco: Never  Substance Use Topics   Alcohol use: No    Alcohol/week: 0.0 standard drinks   Drug use: No    Family History  Problem Relation Age of Onset   Memory loss Mother    Hypertension Mother    Hyperlipidemia Mother    Arthritis Other        grandparent   Breast cancer Maternal Grandmother    Seizures Son     Allergies  Allergen Reactions   Sulfonamide Derivatives     REACTION: unspecified; as child    Medication list has been reviewed and updated.  Current Outpatient Medications on File Prior to Visit  Medication Sig Dispense Refill   calcium gluconate 500 MG tablet Take 500 mg by mouth daily.  Cholecalciferol (VITAMIN D) 2000 UNITS CAPS Take by mouth.     fluticasone (FLONASE) 50 MCG/ACT nasal spray Place 2 sprays into both nostrils daily. 16 g 6   gabapentin (NEURONTIN) 100 MG capsule Take 2 capsules (200 mg total) by mouth at bedtime. 180 capsule 1   meloxicam (MOBIC) 15 MG tablet Take 1 tablet (15 mg total) by mouth daily as needed. Use for back pain 90 tablet 1   venlafaxine XR (EFFEXOR XR) 37.5 MG 24 hr capsule Take 1 capsule (37.5 mg total) by mouth daily with breakfast. 90 capsule 1   No current facility-administered medications on file prior to visit.    Review of Systems:  As per HPI- otherwise negative.   Physical Examination: Vitals:   12/24/20 1448  BP: 124/68  Pulse: 70  Resp: 17  Temp: 98.1 F (36.7 C)  SpO2: 99%   Vitals:   12/24/20 1448  Weight: 188 lb 12.8 oz (85.6 kg)  Height: '5\' 3"'$  (1.6 m)   Body mass index is 33.44 kg/m. Ideal Body Weight: Weight in (lb)  to have BMI = 25: 140.8  GEN: no acute distress.  Overweight, looks well HEENT: Atraumatic, Normocephalic.  Ears and Nose: No external deformity. CV: RRR, No M/G/R. No JVD. No thrill. No extra heart sounds. PULM: CTA B, no wheezes, crackles, rhonchi. No retractions. No resp. distress. No accessory muscle use. EXTR: No c/c/e PSYCH: Normally interactive. Conversant.    Assessment and Plan: Hot flashes - Plan: Ambulatory referral to Obstetrics / Gynecology Patient seen today with worsening of hot flashes.  She is not eager to start on hormones, but might consider doing this if necessary.  For the time being we decided to increase her Effexor to 75 mg.  She would be able to do this increase with the supplies she has at home, will update me in about 2 weeks.  At that time we can increase to 150 mg if she would like If symptoms or not controlled with this medication, she might consider going back on hormone replacement per GYN She will keep me posted   Signed Lamar Blinks, MD

## 2020-12-25 ENCOUNTER — Other Ambulatory Visit (HOSPITAL_BASED_OUTPATIENT_CLINIC_OR_DEPARTMENT_OTHER): Payer: Self-pay

## 2021-01-09 ENCOUNTER — Encounter: Payer: Self-pay | Admitting: Family Medicine

## 2021-01-09 DIAGNOSIS — R0683 Snoring: Secondary | ICD-10-CM

## 2021-01-15 ENCOUNTER — Encounter: Payer: Self-pay | Admitting: Family Medicine

## 2021-01-15 ENCOUNTER — Ambulatory Visit (INDEPENDENT_AMBULATORY_CARE_PROVIDER_SITE_OTHER): Payer: No Typology Code available for payment source | Admitting: Family Medicine

## 2021-01-15 ENCOUNTER — Other Ambulatory Visit: Payer: Self-pay

## 2021-01-15 VITALS — BP 118/78 | HR 74 | Temp 98.3°F | Resp 18 | Ht 63.0 in | Wt 187.0 lb

## 2021-01-15 DIAGNOSIS — J069 Acute upper respiratory infection, unspecified: Secondary | ICD-10-CM | POA: Diagnosis not present

## 2021-01-15 DIAGNOSIS — H6983 Other specified disorders of Eustachian tube, bilateral: Secondary | ICD-10-CM | POA: Diagnosis not present

## 2021-01-15 MED ORDER — METHYLPREDNISOLONE ACETATE 80 MG/ML IJ SUSP
80.0000 mg | Freq: Once | INTRAMUSCULAR | Status: AC
  Administered 2021-01-15: 80 mg via INTRAMUSCULAR

## 2021-01-15 NOTE — Patient Instructions (Signed)
Take some honey.   Let me know if you need to me send in some cough medication.   Continue to push fluids, practice good hand hygiene, and cover your mouth if you cough.  If you start having fevers, shaking or shortness of breath, seek immediate care.  Let us know if you need anything.

## 2021-01-15 NOTE — Progress Notes (Addendum)
CC: URI s/s's   Kathy King here for URI complaints.  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   Past Medical History:  Diagnosis Date   Adenomatous colon polyp    tubular   CHICKENPOX, HX OF    OSTEOPENIA    SCIATICA, LEFT 08/09/2009   s/p PT, NSAIDs   Unspecified vitamin D deficiency     Objective BP 118/78   Pulse 74   Temp 98.3 F (36.8 C)   Resp 18   Ht 5\' 3"  (1.6 m)   Wt 187 lb (84.8 kg)   SpO2 94%   BMI 33.13 kg/m  General: Awake, alert, appears stated age HEENT: AT, [place], ears patent b/l and TM's retracted b/l w/o erythema or fluid/purulence, nares patent w/o discharge, pharynx pink and without exudates, MMM Neck: No masses or asymmetry Heart: RRR Lungs: CTAB, no accessory muscle use Psych: Age appropriate judgment and insight, normal mood and affect  Viral URI with cough - Plan: methylPREDNISolone acetate (DEPO-MEDROL) injection 80 mg  Dysfunction of both eustachian tubes  Continue to push fluids, practice good hand hygiene, cover mouth when coughing. Will get sample of ICS for her to use daily.  F/u prn. If starting to experience fevers, shaking, or shortness of breath, seek immediate care. Pt voiced understanding and agreement to the plan.  [place], DO 01/15/21 4:27 PM

## 2021-01-16 ENCOUNTER — Ambulatory Visit: Payer: No Typology Code available for payment source | Admitting: Family Medicine

## 2021-02-04 ENCOUNTER — Other Ambulatory Visit: Payer: Self-pay | Admitting: Family Medicine

## 2021-02-04 DIAGNOSIS — Z1231 Encounter for screening mammogram for malignant neoplasm of breast: Secondary | ICD-10-CM

## 2021-02-07 ENCOUNTER — Other Ambulatory Visit (HOSPITAL_BASED_OUTPATIENT_CLINIC_OR_DEPARTMENT_OTHER): Payer: Self-pay

## 2021-02-07 MED ORDER — BUSPIRONE HCL 5 MG PO TABS
ORAL_TABLET | ORAL | 0 refills | Status: DC
  Filled 2021-02-07: qty 30, 15d supply, fill #0

## 2021-02-07 MED ORDER — SERTRALINE HCL 50 MG PO TABS
ORAL_TABLET | ORAL | 1 refills | Status: DC
  Filled 2021-02-07: qty 30, 30d supply, fill #0

## 2021-02-08 ENCOUNTER — Other Ambulatory Visit (HOSPITAL_BASED_OUTPATIENT_CLINIC_OR_DEPARTMENT_OTHER): Payer: Self-pay

## 2021-02-19 ENCOUNTER — Other Ambulatory Visit: Payer: Self-pay | Admitting: Family Medicine

## 2021-02-19 ENCOUNTER — Other Ambulatory Visit (HOSPITAL_BASED_OUTPATIENT_CLINIC_OR_DEPARTMENT_OTHER): Payer: Self-pay

## 2021-02-19 DIAGNOSIS — M5416 Radiculopathy, lumbar region: Secondary | ICD-10-CM

## 2021-02-19 MED ORDER — GABAPENTIN 100 MG PO CAPS
200.0000 mg | ORAL_CAPSULE | Freq: Every day | ORAL | 1 refills | Status: DC
  Filled 2021-02-19: qty 180, 90d supply, fill #0
  Filled 2021-05-17: qty 180, 90d supply, fill #1

## 2021-03-28 ENCOUNTER — Ambulatory Visit
Admission: RE | Admit: 2021-03-28 | Discharge: 2021-03-28 | Disposition: A | Discharge status: Home / Self Care | Payer: No Typology Code available for payment source | Source: Ambulatory Visit | Attending: Family Medicine | Admitting: Family Medicine

## 2021-03-28 ENCOUNTER — Other Ambulatory Visit: Payer: Self-pay

## 2021-03-28 DIAGNOSIS — Z1231 Encounter for screening mammogram for malignant neoplasm of breast: Secondary | ICD-10-CM

## 2021-04-11 ENCOUNTER — Ambulatory Visit: Payer: No Typology Code available for payment source | Admitting: Neurology

## 2021-05-17 ENCOUNTER — Other Ambulatory Visit (HOSPITAL_BASED_OUTPATIENT_CLINIC_OR_DEPARTMENT_OTHER): Payer: Self-pay

## 2021-05-22 ENCOUNTER — Ambulatory Visit (INDEPENDENT_AMBULATORY_CARE_PROVIDER_SITE_OTHER): Payer: No Typology Code available for payment source | Admitting: Family Medicine

## 2021-05-22 VITALS — BP 114/60 | HR 57 | Resp 18 | Ht 63.0 in | Wt 190.0 lb

## 2021-05-22 DIAGNOSIS — Z13220 Encounter for screening for lipoid disorders: Secondary | ICD-10-CM

## 2021-05-22 DIAGNOSIS — E6609 Other obesity due to excess calories: Secondary | ICD-10-CM

## 2021-05-22 DIAGNOSIS — Z131 Encounter for screening for diabetes mellitus: Secondary | ICD-10-CM | POA: Diagnosis not present

## 2021-05-22 DIAGNOSIS — Z130 Encounter for screening for diseases of the blood and blood-forming organs and certain disorders involving the immune mechanism: Secondary | ICD-10-CM

## 2021-05-22 DIAGNOSIS — Z1329 Encounter for screening for other suspected endocrine disorder: Secondary | ICD-10-CM | POA: Diagnosis not present

## 2021-05-22 DIAGNOSIS — Z6833 Body mass index (BMI) 33.0-33.9, adult: Secondary | ICD-10-CM

## 2021-05-22 DIAGNOSIS — M5416 Radiculopathy, lumbar region: Secondary | ICD-10-CM

## 2021-05-22 MED ORDER — OZEMPIC (0.25 OR 0.5 MG/DOSE) 2 MG/1.5ML ~~LOC~~ SOPN: 0.2500 mg | PEN_INJECTOR | SUBCUTANEOUS | 1 refills | Status: DC

## 2021-05-22 MED ORDER — GABAPENTIN 100 MG PO CAPS: 200.0000 mg | ORAL_CAPSULE | Freq: Every day | ORAL | 1 refills | Status: DC

## 2021-05-22 NOTE — Progress Notes (Addendum)
[facility] [place], [place], [place] [address] [phone]  Date:  05/22/2021   Name:  Kathy King   DOB:  1956-02-11   MRN:  [id number]  PCP:  Darreld Mclean, MD    Chief Complaint: weight concerns   History of Present Illness:  Kathy King is a 66 y.o. very pleasant female patient who presents with the following:  Visit today to discuss weight- pt is concerned about her weight and is interested in using ozempic for weight loss  Most recent labs done in June 2022-vitamin D normal at that time Can update lab work today  Pt notes she has struggled with her weight for a long time She has been under a lot of stress with her job It is hard to exercise given her work schedule and family pressures She has tried not to eat as many calories, but dieting has not been effective in losing weight  No history of pancreatitis No history of thyroid cancer or MEN in herself or the family  Patient Active Problem List   Diagnosis Date Noted   Osteopenia 05/12/2019   Synovitis of left foot 05/25/2018   Low back pain 12/12/2016   Vitamin D deficiency    Cough variant asthma    Lumbar radiculopathy 10/22/2011   SCIATICA, LEFT 08/09/2009   Disorder of bone and cartilage 08/09/2009    Past Medical History:  Diagnosis Date   Adenomatous colon polyp    tubular   CHICKENPOX, HX OF    OSTEOPENIA    SCIATICA, LEFT 08/09/2009   s/p PT, NSAIDs   Unspecified vitamin D deficiency     Past Surgical History:  Procedure Laterality Date   Arthroscopic shoulder  2008   CERVICAL FUSION  2006   CESAREAN SECTION  88 & 93   x's 2   TONSILLECTOMY  1965    Social History   Tobacco Use   Smoking status: Never   Smokeless tobacco: Never  Substance Use Topics   Alcohol use: No    Alcohol/week: 0.0 standard drinks   Drug use: No    Family History  Problem Relation Age of Onset   Memory loss Mother     Hypertension Mother    Hyperlipidemia Mother    Arthritis Other        grandparent   Breast cancer Maternal Grandmother    Seizures Son     Allergies  Allergen Reactions   Sulfonamide Derivatives     REACTION: unspecified; as child    Medication list has been reviewed and updated.  Current Outpatient Medications on File Prior to Visit  Medication Sig Dispense Refill   Cholecalciferol (VITAMIN D) 2000 UNITS CAPS Take by mouth.     No current facility-administered medications on file prior to visit.    Review of Systems:  As per HPI- otherwise negative.   Physical Examination: Vitals:   05/22/21 1442  BP: 114/60  Pulse: (!) 57  Resp: 18  SpO2: 97%   Vitals:   05/22/21 1442  Weight: 190 lb (86.2 kg)  Height: 5\' 3"  (1.6 m)   Body mass index is 33.66 kg/m. Ideal Body Weight: Weight in (lb) to have BMI = 25: 140.8  GEN: no acute distress.  Mildly obese, looks well HEENT: Atraumatic, Normocephalic.  Ears and Nose: No external deformity. CV: RRR, No M/G/R. No JVD. No thrill. No extra heart sounds. PULM: CTA B, no wheezes,  crackles, rhonchi. No retractions. No resp. distress. No accessory muscle use. EXTR: No c/c/e PSYCH: Normally interactive. Conversant.    Assessment and Plan: Screening for diabetes mellitus - Plan: Comprehensive metabolic panel, Hemoglobin A1c  Screening for hyperlipidemia - Plan: Lipid panel  Screening for deficiency anemia - Plan: CBC  Screening for thyroid disorder - Plan: TSH  Class 1 obesity due to excess calories without serious comorbidity with body mass index (BMI) of 33.0 to 33.9 in adult - Plan: Semaglutide,0.25 or 0.5MG /DOS, (OZEMPIC, 0.25 OR 0.5 MG/DOSE,) 2 MG/1.5ML SOPN  Lumbar radiculopathy - Plan: gabapentin (NEURONTIN) 100 MG capsule  Lab work pending as above Gave a Midwife, she was started 0.25 weekly for 4 weeks She will monitor for any side effects or other problems, will keep me  posted  Signed Lamar Blinks, MD  Received labs as below 1/26- message to pt  Results for orders placed or performed in visit on 05/22/21  CBC  Result Value Ref Range   WBC 6.3 4.0 - 10.5 K/uL   RBC 4.08 3.87 - 5.11 Mil/uL   Platelets 204.0 150.0 - 400.0 K/uL   Hemoglobin 12.1 12.0 - 15.0 g/dL   HCT 37.0 36.0 - 46.0 %   MCV 90.7 78.0 - 100.0 fl   MCHC 32.6 30.0 - 36.0 g/dL   RDW 12.6 11.5 - 15.5 %  Comprehensive metabolic panel  Result Value Ref Range   Sodium 141 135 - 145 mEq/L   Potassium 4.4 3.5 - 5.1 mEq/L   Chloride 105 96 - 112 mEq/L   CO2 31 19 - 32 mEq/L   Glucose, Bld 80 70 - 99 mg/dL   BUN 15 6 - 23 mg/dL   Creatinine, Ser 0.99 0.40 - 1.20 mg/dL   Total Bilirubin 0.4 0.2 - 1.2 mg/dL   Alkaline Phosphatase 71 39 - 117 U/L   AST 17 0 - 37 U/L   ALT 15 0 - 35 U/L   Total Protein 6.1 6.0 - 8.3 g/dL   Albumin 4.3 3.5 - 5.2 g/dL   GFR 60.86 >60.00 mL/min   Calcium 9.4 8.4 - 10.5 mg/dL  Hemoglobin A1c  Result Value Ref Range   Hgb A1c MFr Bld 5.4 4.6 - 6.5 %  Lipid panel  Result Value Ref Range   Cholesterol 199 0 - 200 mg/dL   Triglycerides 72.0 0.0 - 149.0 mg/dL   HDL 67.10 >39.00 mg/dL   VLDL 14.4 0.0 - 40.0 mg/dL   LDL Cholesterol 117 (H) 0 - 99 mg/dL   Total CHOL/HDL Ratio 3    NonHDL 131.46   TSH  Result Value Ref Range   TSH 2.24 0.35 - 5.50 uIU/mL

## 2021-05-22 NOTE — Patient Instructions (Signed)
I will be in touch with your labs Ozempic- start with 0.25 mg injected weekly for 4 weeks- then we can go to 0.5 mg assuming you tolerate ok Can go up from there- let me know how it works for you

## 2021-05-23 ENCOUNTER — Encounter: Payer: Self-pay | Admitting: Family Medicine

## 2021-05-23 LAB — TSH: TSH: 2.24 u[IU]/mL (ref 0.35–5.50)

## 2021-05-23 LAB — COMPREHENSIVE METABOLIC PANEL
ALT: 15 U/L (ref 0–35)
AST: 17 U/L (ref 0–37)
Albumin: 4.3 g/dL (ref 3.5–5.2)
Alkaline Phosphatase: 71 U/L (ref 39–117)
BUN: 15 mg/dL (ref 6–23)
CO2: 31 mEq/L (ref 19–32)
Calcium: 9.4 mg/dL (ref 8.4–10.5)
Chloride: 105 mEq/L (ref 96–112)
Creatinine, Ser: 0.99 mg/dL (ref 0.40–1.20)
GFR: 60.86 mL/min (ref >60.00)
Glucose, Bld: 80 mg/dL (ref 70–99)
Potassium: 4.4 mEq/L (ref 3.5–5.1)
Sodium: 141 mEq/L (ref 135–145)
Total Bilirubin: 0.4 mg/dL (ref 0.2–1.2)
Total Protein: 6.1 g/dL (ref 6.0–8.3)

## 2021-05-23 LAB — HEMOGLOBIN A1C: Hgb A1c MFr Bld: 5.4 % (ref 4.6–6.5)

## 2021-05-23 LAB — CBC
HCT: 37 % (ref 36.0–46.0)
Hemoglobin: 12.1 g/dL (ref 12.0–15.0)
MCHC: 32.6 g/dL (ref 30.0–36.0)
MCV: 90.7 fl (ref 78.0–100.0)
Platelets: 204 10*3/uL (ref 150.0–400.0)
RBC: 4.08 Mil/uL (ref 3.87–5.11)
RDW: 12.6 % (ref 11.5–15.5)
WBC: 6.3 10*3/uL (ref 4.0–10.5)

## 2021-05-23 LAB — LIPID PANEL
Cholesterol: 199 mg/dL (ref 0–200)
HDL: 67.1 mg/dL (ref >39.00)
LDL Cholesterol: 117 mg/dL — ABNORMAL HIGH (ref 0–99)
NonHDL: 131.46
Total CHOL/HDL Ratio: 3
Triglycerides: 72 mg/dL (ref 0.0–149.0)
VLDL: 14.4 mg/dL (ref 0.0–40.0)

## 2021-08-09 ENCOUNTER — Other Ambulatory Visit (HOSPITAL_BASED_OUTPATIENT_CLINIC_OR_DEPARTMENT_OTHER): Payer: Self-pay

## 2021-08-09 ENCOUNTER — Other Ambulatory Visit: Payer: Self-pay | Admitting: Family Medicine

## 2021-08-09 DIAGNOSIS — M5416 Radiculopathy, lumbar region: Secondary | ICD-10-CM

## 2021-08-09 MED ORDER — GABAPENTIN 100 MG PO CAPS
200.0000 mg | ORAL_CAPSULE | Freq: Every day | ORAL | 1 refills | Status: DC
  Filled 2021-08-09: qty 180, 90d supply, fill #0

## 2021-08-26 ENCOUNTER — Ambulatory Visit (INDEPENDENT_AMBULATORY_CARE_PROVIDER_SITE_OTHER): Payer: No Typology Code available for payment source | Admitting: Family Medicine

## 2021-08-26 ENCOUNTER — Other Ambulatory Visit (HOSPITAL_BASED_OUTPATIENT_CLINIC_OR_DEPARTMENT_OTHER): Payer: Self-pay

## 2021-08-26 VITALS — BP 120/74 | HR 79 | Resp 18 | Ht 63.0 in | Wt 185.0 lb

## 2021-08-26 DIAGNOSIS — M79671 Pain in right foot: Secondary | ICD-10-CM

## 2021-08-26 DIAGNOSIS — M2550 Pain in unspecified joint: Secondary | ICD-10-CM

## 2021-08-26 DIAGNOSIS — M722 Plantar fascial fibromatosis: Secondary | ICD-10-CM

## 2021-08-26 LAB — C-REACTIVE PROTEIN: CRP: <1 mg/dL (ref 0.5–20.0)

## 2021-08-26 LAB — SEDIMENTATION RATE: Sed Rate: 4 mm/hr (ref 0–30)

## 2021-08-26 MED ORDER — DULOXETINE HCL 60 MG PO CPEP
60.0000 mg | ORAL_CAPSULE | Freq: Every day | ORAL | 6 refills | Status: DC
  Filled 2021-08-26: qty 30, 30d supply, fill #0

## 2021-08-26 NOTE — Progress Notes (Addendum)
[facility] ?[address], [address] ?[place], [place] [address] ?336 [phone] ?Fax [phone] ? ?Date:  08/26/2021  ? ?Name:  Kathy King   DOB:  1955/07/01   MRN:  [id number] ? ?PCP:  Darreld Mclean, MD  ? ? ?Chief Complaint: Insomnia (And joint pain. Taking Ibu as needed. Unsure what started first. ) ? ? ?History of Present Illness: ? ?Kathy King is a 66 y.o. very pleasant female patient who presents with the following: ? ?Seen today for follow-up ?Last seen by myself in January - at that time we planned to start her on ozempic for weight loss.  She tried it for a month but the nausea was too much- she stopped using it  ? ?Also treated with gabapentin for lumbar radiculopathy  ?Also taking vit D ?No other medications at this time  ? ?[name] notes pain in her right heel for about 2 weeks- right side only  ?NKI ?Will come and go somewhat ?Cannot walk barefoot due to pain ?Supportive shoes are best ?Worst first in the am or after sitting for a while ? ?Notes she has some days when she feels really achy, has joint pain ?She tends to take ibuprofen for her various pains before bed- not just for her back ?She has to limit nsaids due to gastritis sx  ?Sometimes she is very tired at night- she may eat, take a hot bath and then goes right to bed  ?She is working 4 10 hour days right now ?Admits she has canceled a couple of appointments for sleep study ? ?She notes some difficulty with sleep- she gets to sleep fine- she feels exhausted- but she wakes up too early and cannot get back to sleep ? ?She tried not taking gabapentin the last 2 nights- she tried a 1/2 trazodone but it did not really help ? ?She also recently tried taking 300 mg of gabapentin at bedtime and noted this did help her sleep ?Her husband has not noted snoring  ? ?Lab Results  ?Component Value Date  ? HGBA1C 5.4 05/22/2021  ? ? ? ? ? ?Patient Active Problem List  ? Diagnosis Date Noted  ? Osteopenia  05/12/2019  ? Synovitis of left foot 05/25/2018  ? Low back pain 12/12/2016  ? Vitamin D deficiency   ? Cough variant asthma   ? Lumbar radiculopathy 10/22/2011  ? SCIATICA, LEFT 08/09/2009  ? Disorder of bone and cartilage 08/09/2009  ? ? ?Past Medical History:  ?Diagnosis Date  ? Adenomatous colon polyp   ? tubular  ? CHICKENPOX, HX OF   ? OSTEOPENIA   ? SCIATICA, LEFT 08/09/2009  ? s/p PT, NSAIDs  ? Unspecified vitamin D deficiency   ? ? ?Past Surgical History:  ?Procedure Laterality Date  ? Arthroscopic shoulder  2008  ? CERVICAL FUSION  2006  ? CESAREAN SECTION  88 & 93  ? x's 2  ? TONSILLECTOMY  1965  ? ? ?Social History  ? ?Tobacco Use  ? Smoking status: Never  ? Smokeless tobacco: Never  ?Substance Use Topics  ? Alcohol use: No  ?  Alcohol/week: 0.0 standard drinks  ? Drug use: No  ? ? ?Family History  ?Problem Relation Age of Onset  ? Memory loss Mother   ? Hypertension Mother   ? Hyperlipidemia Mother   ? Arthritis Other   ?     grandparent  ? Breast cancer Maternal Grandmother   ? Seizures  Son   ? ? ?Allergies  ?Allergen Reactions  ? Sulfonamide Derivatives   ?  REACTION: unspecified; as child  ? ? ?Medication list has been reviewed and updated. ? ?Current Outpatient Medications on File Prior to Visit  ?Medication Sig Dispense Refill  ? Cholecalciferol (VITAMIN D) 2000 UNITS CAPS Take by mouth.    ? gabapentin (NEURONTIN) 100 MG capsule Take 2 capsules (200 mg total) by mouth at bedtime. 180 capsule 1  ? ?No current facility-administered medications on file prior to visit.  ? ? ?Review of Systems: ? ?As per HPI- otherwise negative. ? ? ?Physical Examination: ?Vitals:  ? 08/26/21 0909  ?BP: 120/74  ?Pulse: 79  ?Resp: 18  ?SpO2: 97%  ? ?Vitals:  ? 08/26/21 0909  ?Weight: 185 lb (83.9 kg)  ?Height: '5\' 3"'$  (1.6 m)  ? ?Body mass index is 32.77 kg/m?. ?Ideal Body Weight: Weight in (lb) to have BMI = 25: 140.8 ? ?GEN: no acute distress.  Mildly obese, looks well ?HEENT: Atraumatic, Normocephalic.  ?Ears and Nose:  No external deformity. ?CV: RRR, No M/G/R. No JVD. No thrill. No extra heart sounds. ?PULM: CTA B, no wheezes, crackles, rhonchi. No retractions. No resp. distress. No accessory muscle use. ?EXTR: No c/c/e ?PSYCH: Normally interactive. Conversant.  ?Tenderness over the right plantar fascia consistent with plantar fasciitis ?No obvious joint abnormalities at the hands, wrists or elbows ?Assessment and Plan: ?Right foot pain ? ?Plantar fasciitis of right foot ? ?Pain in joint, multiple sites - Plan: Sedimentation rate, C-reactive protein, Antinuclear Antib (ANA), Rheumatoid Factor, DULoxetine (CYMBALTA) 60 MG capsule ? ?Patient seen today with a couple of concerns.  She seems to have plantar fasciitis, recommended conservative treatment including stretching before rising in the morning and ice massage.  She will let me know if getting worse or if not improving in the next couple of months ?She notes pain in multiple joints, general feeling of achiness.  She wonders if Cymbalta might be helpful for her chronic pain, certainly we can give this a try.  I encouraged her to reschedule her sleep study which she plans to do.  Also okay to take gabapentin 300 mg at night-if she finds this helpful I can prescribe the 300 mg strength ?We thought of trying Celebrex, but she has a sulfa allergy ?We will obtain basic autoimmune labs as above ? ?With labs-inquire about Pap smear ? ?Signed ?Lamar Blinks, MD ? ?Results for orders placed or performed in visit on 08/26/21  ?Sedimentation rate  ?Result Value Ref Range  ? Sed Rate 4 0 - 30 mm/hr  ?C-reactive protein  ?Result Value Ref Range  ? CRP <1.0 0.5 - 20.0 mg/dL  ? ?Received labs as below, 5/2-message to patient ? ?Results for orders placed or performed in visit on 08/26/21  ?Sedimentation rate  ?Result Value Ref Range  ? Sed Rate 4 0 - 30 mm/hr  ?C-reactive protein  ?Result Value Ref Range  ? CRP <1.0 0.5 - 20.0 mg/dL  ?Rheumatoid Factor  ?Result Value Ref Range  ? Rhuematoid  fact SerPl-aCnc <14 <14 IU/mL  ? ? ? ?

## 2021-08-26 NOTE — Patient Instructions (Addendum)
It was good to see you today- try ice massage and stretching for your foot ?Please call [facility] to set up your sleep study- let me know if you need a new referral  ? ?Address: [address], [place], [place] [address] ?Phone: [phone] ? ?I will be in touch with your labs asap ?Ok to use 300 mg of gabapentin at bedtime- let me know if you stick with this dose  ?We will try cymbalta as well- start on 60 mg daily and let me know what you think  ? ? ?

## 2021-08-27 ENCOUNTER — Encounter: Payer: Self-pay | Admitting: Family Medicine

## 2021-08-28 ENCOUNTER — Encounter: Payer: Self-pay | Admitting: Family Medicine

## 2021-08-28 DIAGNOSIS — R768 Other specified abnormal immunological findings in serum: Secondary | ICD-10-CM

## 2021-08-28 LAB — RHEUMATOID FACTOR: Rheumatoid fact SerPl-aCnc: <14 IU/mL (ref <14)

## 2021-08-28 LAB — ANA: Anti Nuclear Antibody (ANA): POSITIVE — AB

## 2021-08-28 LAB — ANTI-NUCLEAR AB-TITER (ANA TITER): ANA Titer 1: 1:40 {titer} — ABNORMAL HIGH

## 2021-08-29 ENCOUNTER — Telehealth: Payer: Self-pay | Admitting: Neurology

## 2021-08-29 NOTE — Telephone Encounter (Signed)
Noted  

## 2021-08-29 NOTE — Telephone Encounter (Signed)
Pt called to schedule a f/u due to worsening symptoms related to insomnia and the request to look into Sleep study.  Pt only available on Thursdays.  Dr [name] had availability earlier than NP as to why appointment was made with Dr Brett Fairy , this is FYI no request for a call back.  ?

## 2021-10-14 ENCOUNTER — Other Ambulatory Visit (INDEPENDENT_AMBULATORY_CARE_PROVIDER_SITE_OTHER): Payer: No Typology Code available for payment source

## 2021-10-14 DIAGNOSIS — R768 Other specified abnormal immunological findings in serum: Secondary | ICD-10-CM | POA: Diagnosis not present

## 2021-10-16 LAB — ANTI-NUCLEAR AB-TITER (ANA TITER): ANA Titer 1: 1:40 {titer} — ABNORMAL HIGH

## 2021-10-16 LAB — ANA: Anti Nuclear Antibody (ANA): POSITIVE — AB

## 2021-10-18 ENCOUNTER — Encounter: Payer: Self-pay | Admitting: Family Medicine

## 2021-10-30 ENCOUNTER — Encounter: Payer: Self-pay | Admitting: Family Medicine

## 2021-10-30 ENCOUNTER — Other Ambulatory Visit (HOSPITAL_BASED_OUTPATIENT_CLINIC_OR_DEPARTMENT_OTHER): Payer: Self-pay

## 2021-10-30 ENCOUNTER — Ambulatory Visit (INDEPENDENT_AMBULATORY_CARE_PROVIDER_SITE_OTHER): Payer: No Typology Code available for payment source | Admitting: Family Medicine

## 2021-10-30 VITALS — BP 120/82 | HR 75 | Temp 97.9°F | Resp 18 | Ht 63.0 in | Wt 185.0 lb

## 2021-10-30 DIAGNOSIS — M79671 Pain in right foot: Secondary | ICD-10-CM | POA: Diagnosis not present

## 2021-10-30 MED ORDER — DULOXETINE HCL 20 MG PO CPEP
20.0000 mg | ORAL_CAPSULE | Freq: Every day | ORAL | 3 refills | Status: DC
  Filled 2021-10-30: qty 30, 30d supply, fill #0
  Filled 2021-11-27: qty 30, 30d supply, fill #1

## 2021-10-30 NOTE — Patient Instructions (Addendum)
Consider Powerstep insoles. There are very quality over the counter inserts. Shop around online and in stores. Dr. Felicie Morn is a cheaper alternative, though is not as high of quality.   Use Voltaren gel.   I think this is the flexor hallucis brevis tendon under your foot.   Ice/cold pack over area for 10-15 min twice daily.  Wear supportive shoes.   Let us know if you need anything.  Stretching and range of motion exercises These exercises warm up your muscles and joints and improve the movement and flexibility of your lower leg. These exercises also help to relieve pain and stiffness.  Exercise A: Gastrocnemius stretch Sit with your left / right leg extended. Loop a belt or towel around the ball of your left / right foot. The ball of your foot is on the walking surface, right under your toes. Hold both ends of the belt or towel. Keep your left / right ankle and foot relaxed and keep your knee straight while you use the belt or towel to pull your foot and ankle toward you. Stop at the first point of resistance. Hold this position for 30 seconds. Repeat 2 times. Complete this exercise 3 times per week.  Exercise B: Ankle alphabet Sit with your left / right leg supported at the lower leg. Do not rest your foot on anything. Make sure your foot has room to move freely. Think of your left / right foot as a paintbrush, and move your foot to trace each letter of the alphabet in the air. Keep your hip and knee still while you trace. Trace every letter from A to Z. Repeat 2 times. Complete this exercise 3 times per week.  Strengthening exercises These exercises build strength and endurance in your lower leg. Endurance is the ability to use your muscles for a long time, even after they get tired.  Exercise C: Plantar flexors with band Sit with your left / right leg extended. Loop a rubber exercise band or tube around the ball of your left / right foot. The ball of your foot is on the  walking surface, right under your toes. While holding both ends of the band or tube, slowly point your toes downward, pushing them away from you. Hold this position for 3 seconds. Slowly return your foot to the starting position and repeat for a total of 10 repetitions. Repeat 2 times. Complete this exercise 3 times per week.  Exercise D: Plantar flexors, standing Stand with your feet shoulder-width apart. Place your hands on a wall or table to steady yourself as needed, but try not to use it very much for support. Rise up on your toes. If this exercise is too easy, try these options: Shift your weight toward your left / right leg until you feel challenged. If told by your health care provider, stand on your left / right foot only. Hold this position for 3 seconds. Repeat for a total of 10 repetitions. Repeat 2 times. Complete this exercise 3 times per week.  Exercise E: Plantar flexors, eccentric Stand on the balls of your feet on the edge of a step. The ball of your foot is on the walking surface, right under your toes. Place your hands on a wall or railing for balance as needed, but try not to lean on it for support. Rise up on your toes, using both legs to help. Slowly shift all of your weight to your left / right foot and lift your other foot off  the step. Slowly lower your left / right heel so it drops below the level of the step. You will feel a slight stretch in your left / right calf. Put your other foot back onto the step. Repeat 2 times. Complete this exercise 3 times per week. This information is not intended to replace advice given to you by your health care provider. Make sure you discuss any questions you have with your health care provider.  Plantar Fasciitis Stretches/exercises Do exercises exactly as told by your health care provider and adjust them as directed. It is normal to feel mild stretching, pulling, tightness, or discomfort as you do these exercises, but you  should stop right away if you feel sudden pain or your pain gets worse.   Stretching and range of motion exercises These exercises warm up your muscles and joints and improve the movement and flexibility of your foot. These exercises also help to relieve pain.  Exercise A: Plantar fascia stretch Sit with your left / right leg crossed over your opposite knee. Hold your heel with one hand with that thumb near your arch. With your other hand, hold your toes and gently pull them back toward the top of your foot. You should feel a stretch on the bottom of your toes or your foot or both. Hold this stretch for 30 seconds. Slowly release your toes and return to the starting position. Repeat 2 times. Complete this exercise 3 times per week.  Exercise B: Gastroc, standing Stand with your hands against a wall. Extend your left / right leg behind you, and bend your front knee slightly. Keeping your heels on the floor and keeping your back knee straight, shift your weight toward the wall without arching your back. You should feel a gentle stretch in your left / right calf. Hold this position for 30 seconds. Repeat 2 times. Complete this exercise 3 times a week. Exercise C: Soleus, standing Stand with your hands against a wall. Extend your left / right leg behind you, and bend your front knee slightly. Keeping your heels on the floor, bend your back knee and slightly shift your weight over the back leg. You should feel a gentle stretch deep in your calf. Hold this position for 30 seconds. Repeat 2 times. Complete this exercise 3 times per week. Exercise D: Gastrocsoleus, standing Stand with the ball of your left / right foot on a step. The ball of your foot is on the walking surface, right under your toes. Keep your other foot firmly on the same step. Hold onto the wall or a railing for balance. Slowly lift your other foot, allowing your body weight to press your heel down over the edge of the step.  You should feel a stretch in your left / right calf. Hold this position for 30 seconds. Return both feet to the step. Repeat this exercise with a slight bend in your left / right knee. Repeat 2 times with your left / right knee straight and 2times with your left / right knee bent. Complete this exercise 3 times a week.  Balance exercise This exercise builds your balance and strength control of your arch to help take pressure off your plantar fascia. Exercise E: Single leg stand Without shoes, stand near a railing or in a doorway. You may hold onto the railing or door frame as needed. Stand on your left / right foot. Keep your big toe down on the floor and try to keep your arch lifted. Do  not let your foot roll inward. Hold this position for 30 seconds. If this exercise is too easy, you can try it with your eyes closed or while standing on a pillow. Repeat 2 times. Complete this exercise 3 times per week. This information is not intended to replace advice given to you by your health care provider. Make sure you discuss any questions you have with your health care provider. Document Released: 04/14/2005 Document Revised: 12/18/2015 Document Reviewed: 02/26/2015 Elsevier Interactive Patient Education  2017 [employer].

## 2021-10-30 NOTE — Progress Notes (Signed)
Musculoskeletal Exam  Patient: Kathy King DOB: 04-25-1956  DOS: 10/30/2021  SUBJECTIVE:  Chief Complaint:   No chief complaint on file.   Kathy King is a 66 y.o.  female for evaluation and treatment of R foot pain.   Onset:  6 months ago. No inj or change in activity.  Location: Over arch of foot when pressing, heel and forefoot when wt bearing Character:  aching and sharp  Progression of issue:  has worsened Associated symptoms: no swelling,, bruising, redness; worse when she goes barefoot or wears footwear with poor support Treatment: to date has been rest and ice.   Neurovascular symptoms: no  Past Medical History:  Diagnosis Date   Adenomatous colon polyp    tubular   CHICKENPOX, HX OF    OSTEOPENIA    SCIATICA, LEFT 08/09/2009   s/p PT, NSAIDs   Unspecified vitamin D deficiency     Objective: VITAL SIGNS: BP 120/82   Pulse 75   Temp 97.9 F (36.6 C)   Resp 18   Ht '5\' 3"'$  (1.6 m)   Wt 185 lb (83.9 kg)   SpO2 98%   BMI 32.77 kg/m  Constitutional: Well formed, well developed. No acute distress. Thorax & Lungs: No accessory muscle use Musculoskeletal: R foot.   Tenderness to palpation: yes over origin of flexor hallucis brevis She really isn't that tender over the plantar surface of the calcaneus or the prox insertion of the PF Deformity: no Ecchymosis: no Her calf/calcaneal tendon are tight Neurologic: Normal sensory function. Normal gait.  Psychiatric: Normal mood. Age appropriate judgment and insight. Alert & oriented x 3.    Assessment:  Right foot pain - Plan: Ambulatory referral to Sports Medicine  Plan: Stretches/exercises for PF and for calf provided, heat, ice, Tylenol. Topical NSAID in addition to arch support rec'd. This seems more FPB rather than PF, but will have her see sports med for possible US/eval. She will let me know if anything changes.  F/u as originally scheduled w reg PCP. The patient voiced understanding and  agreement to the plan.   [place], DO 10/30/21  9:18 PM

## 2021-11-07 ENCOUNTER — Encounter: Payer: Self-pay | Admitting: Neurology

## 2021-11-07 ENCOUNTER — Ambulatory Visit: Payer: No Typology Code available for payment source | Admitting: Neurology

## 2021-11-07 VITALS — BP 114/66 | HR 65 | Ht 63.0 in | Wt 188.0 lb

## 2021-11-07 DIAGNOSIS — G9331 Postviral fatigue syndrome: Secondary | ICD-10-CM | POA: Diagnosis not present

## 2021-11-07 DIAGNOSIS — G4709 Other insomnia: Secondary | ICD-10-CM

## 2021-11-07 DIAGNOSIS — N951 Menopausal and female climacteric states: Secondary | ICD-10-CM | POA: Diagnosis not present

## 2021-11-07 NOTE — Progress Notes (Addendum)
SLEEP MEDICINE CLINIC    Provider:  Larey Seat, MD  Primary Care Physician:  Darreld Mclean, [address] [place] [address]     Referring Provider: Darreld Mclean, [address],  [place] [address]          Chief Complaint according to patient   Patient presents with:     New Patient (Initial Visit)     pt alone, tm 10. pt presents with increasing Insomnia-  to discuss difficulty with her sleep. she states that she usually can fall asleep with no problems but wakes up anywhere from 12-3 am and struggles with falling back to sleep. wakes up to void several times. pt states she has been told snores and has some irreuglarties with breathing in sleep per husband. 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 Last consultation in [phone].  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 She is worried by increasing insomnia- she falls asleep well, but wakes up easily, a bathroom break can cause a delayed re-initiation of sleep. She doesn't snore loudly, more of an audible breathing.  " When I get home from work and I eat dinner and  go to bed-  I have no strength left to even walk the dog. I take a hot bath or shower, and can go to sleep easily".  She is a water drinker, rarely tea, one cup of coffee in AM.  Never been a drinker or smoker. She has not worked shifts.        09-15-2019 Chief concern according to patient : RN presents today to discuss difficulty with her sleep. she states that she usually can fall asleep without problems but wakes up between 12-3 am and struggles with returning back to sleep. Wakes up and uses the arousal to void several times,pt states she has been told snores and has some irreuglarties  with breathing in sleep per husband.  Son is an ICU nurse and has CPAP. 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 Shift worker, menopausal since age 6. Progesteron helped sleeping better.      Sleep relevant medical history: Nocturia/ hypersomnia after lunch- sore and fatigued. Tonsillectomy , anterior cervical spine surgery, no ENT surgery, some hearing loss.  Family medical /sleep history: son with OSA, insomnia.    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  The patient currently works 10 hour shifts.  Pets are present. Tobacco use: never .  ETOH use; none,  Caffeine intake in form of Coffee( 2-3 / AM) Soda( none ) Tea ( rare ) or energy drinks. Regular exercise in form of walking.   Hobbies : large family , gardening.    Sleep habits are as follows: The patient's work ends at 5.30 and dinner time is between 6-7 PM. The patient goes to bed  at 8.30-9 PM and continues to sleep for 2-3 hours, wakes spontaneously and uses this for frequent  bathroom breaks.  Light sleeper.  The preferred sleep position is Right side, left hip bursitis, with the support of 2  pillows.  Dreams are reportedly infrequent . 4.15 AM is the usual rise time.  The patient wakes up spontaneously at 4 AM with an alarm.   She reports not feeling refreshed or restored in AM, with morning headaches, and residual fatigue.  Naps are taken rarely.        Component Ref Range & Units 3 wk ago 2 mo ago  ANA Titer 1 titer 1:40 High   1:40 High  CM   Comment: A low level ANA titer may be present in pre-clinical  autoimmune diseases and normal individuals.                  Reference Range                  <1:40        Negative                   1:40-1:80    Low Antibody Level                  >1:80        Elevated Antibody Level  .   ANA Pattern 1  Nuclear, Speckled Abnormal   Nuclear, Speckled Abnormal  CM   Comment: Speckled pattern is associated with mixed connective  tissue disease (MCTD), systemic lupus erythematosus  (SLE), Sjogren's syndrome, dermatomyositis, and  systemic sclerosis/polymyositis overlap.  .  AC-2,4,5,29: Speckled        Review of Systems: Out of a complete 14 system review, the patient complains of only the following symptoms, and all other reviewed systems are negative.:  Fatigue, sleepiness , perhaps mild snoring,  fragmented sleep, Insomnia -nocturia  2 times .- she feels she is awake due to other causes and then goes to the bathroom, it is not the urge to void that wakes her.    Hot flushes.   How likely are you to doze in the following situations: 0 = not likely, 1 = slight chance, 2 = moderate chance, 3 = high chance   Sitting and Reading? Watching Television? Sitting inactive in a public place (theater or meeting)? As a passenger in a car for an hour without a break? Lying down in the afternoon when circumstances permit? Sitting and talking to someone? Sitting quietly after lunch without alcohol? In a car, while stopped for a few minutes in traffic?   Total = still at 8 - / 24 points   FSS endorsed at  63 even higher in comparison to May 2021; 55/ 63 points.   GDS: 3/ 15 points- no clinical depression, no nocturnal pain.   Social History   Socioeconomic History   Marital status: Married    Spouse name: Not on file   Number of children: Not on file   Years of education: Not on file   Highest education level: Not on file  Occupational History   Not on file  Tobacco Use   Smoking status: Never   Smokeless tobacco: Never  Substance and Sexual Activity   Alcohol use: No    Alcohol/week: 0.0 standard drinks of alcohol   Drug use: No   Sexual activity: Not on file   Other Topics Concern   Not on file  Social History  Narrative   Married, lives with spouse and kids   Social Determinants of Health   Financial Resource Strain: Not on file  Food Insecurity: Not on file  Transportation Needs: Not on file  Physical Activity: Not on file  Stress: Not on file  Social Connections: Not on file    Family History  Problem Relation Age of Onset   Memory loss Mother    Hypertension Mother    Hyperlipidemia Mother    Arthritis Other        grandparent   Breast cancer Maternal Grandmother    Seizures Son     Past Medical History:  Diagnosis Date   Adenomatous colon polyp    tubular   CHICKENPOX, HX OF    OSTEOPENIA    SCIATICA, LEFT 08/09/2009   s/p PT, NSAIDs   Unspecified vitamin D deficiency     Past Surgical History:  Procedure Laterality Date   Arthroscopic shoulder  2008   CERVICAL FUSION  2006   CESAREAN SECTION  88 & 93   x's 2   TONSILLECTOMY  1965     Current Outpatient Medications on File Prior to Visit  Medication Sig Dispense Refill   Cholecalciferol (VITAMIN D) 2000 UNITS CAPS Take by mouth.     DULoxetine (CYMBALTA) 20 MG capsule Take 1 capsule (20 mg total) by mouth daily. 30 capsule 3   gabapentin (NEURONTIN) 100 MG capsule Take 2 capsules (200 mg total) by mouth at bedtime. (Patient taking differently: Take 300 mg by mouth at bedtime.) 180 capsule 1   No current facility-administered medications on file prior to visit.    Allergies  Allergen Reactions   Sulfonamide Derivatives     REACTION: unspecified; as child    Physical exam:  Today's Vitals   11/07/21 0909  BP: 114/66  Pulse: 65  Weight: 188 lb (85.3 kg)  Height: _0  (1.6 m)   Body mass index is 33.3 kg/m.   Wt Readings from Last 3 Encounters:  11/07/21 188 lb (85.3 kg)  10/30/21 185 lb (83.9 kg)  08/26/21 185 lb (83.9 kg)     Ht Readings from Last 3 Encounters:  11/07/21 _1  (1.6 m)  10/30/21 _2  (1.6 m)  08/26/21 _3  (1.6 m)       General: The patient is awake, alert and appears not in acute distress. The patient is well groomed. Head: Normocephalic, atraumatic. Neck is supple. Mallampati 2-3,  neck circumference: still 15 inches . Nasal airflow patent.   Retrognathia - small oral opening.  Dental status: intact , small lower jaw.  Cardiovascular:  Regular rate and cardiac rhythm by pulse,  without distended neck veins. Respiratory: Lungs are clear to auscultation.  Skin:  Without evidence of ankle edema, or rash. Trunk: The patient's posture is erect.   Neurologic exam : The patient is awake and alert, oriented to place and time.   Memory subjective described as intact.  Attention span & concentration ability appears normal.  Speech is fluent,  without  dysarthria, dysphonia or aphasia.  Mood and affect are appropriate.   Cranial nerves: no loss of smell or taste reported  Pupils are equal and briskly reactive to light. Funduscopic exam: deferred. .  Extraocular movements in vertical and horizontal planes were intact and without nystagmus. No Diplopia. Visual fields by finger perimetry are intact. Hearing was intact to soft voice and finger rubbing.    Facial sensation intact to fine touch.  Facial motor strength is symmetric and  tongue and uvula move midline.  Neck ROM : rotation, tilt and flexion extension were normal for age and shoulder shrug was symmetrical.    Motor exam:  Symmetric bulk, tone and ROM.   Normal tone without cog -wheeling, symmetric grip strength .   Sensory:  Fine touch and vibration were tested  and  normal.  Proprioception tested in the upper extremities was normal.   Coordination: Rapid alternating movements in the fingers/hands were of normal speed.  The Finger-to-nose maneuver was intact .   Gait and station: Patient could rise unassisted from a seated position, walked without assistive device.  Stance is of normal width/ base and the patient turned with 3 steps.  Toe and  heel walk were deferred.  Deep tendon reflexes: in the  upper and lower extremities are symmetric and intact.  Babinski response was deferred .      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  She has also been witnessed to have some irregular breathing at night and she snores mildly  .  She slept last very well when she was on progesterone supplements, 8 years ago(?) .  These have not been prescribed in the last couple of years. She still has a few hot flashes at night and she reports that she is such a light sleeper that she often wakes up by no identifiable trigger at all and then goes to the bathroom but it is not the urge to urinate that wakes her-  She can visit the bathroom 2 times or more at night.  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   Her physical exam reveals  retrognathia there is no scalloped tongue but a low soft tissue creating a v class 2-3 Mallampati.   Neck circumference is  15 "normal, BMI is 33 slightly elevated but she is not morbidly obese.  Muscle tone coordination and sensation to vibration and touch are intact.     My Plan is to proceed with:  1) what we are investigating today is insomnia, nonrestorative sleep but it seems no longer be dominated by menstrual or menopausal changes much less so than 2 years ago, there is also less nocturia than when I met Mrs. Lynds before.  She has no trouble initiating sleep she has trouble to stay  asleep.  So I am investigating if she may have upper airway resistance syndrome, if she has an unspecified form of sleep apnea, if there are cardiac arrhythmias at night, if there are periodic limb movements at night.  I am less inclined to order a home sleep test because I am just not looking at apnea rule and rule out I want to see what leads to her arousals.  And she has clearly stated that she arouses from sleep and then goes to the bathroom and it is not the urge to void that wakes her.  To see other physiologic factors in this form of insomnia I need to have an attended sleep study.  2)  I suggested to discuss external topical or intravaginal use of progesterone, which is associated with a low cancer risk. She will discuss with Dr Copland/ Dr. At physician for women.   3) insomnia had not responded much to trazodone. She is sensitive and wakes with headaches- She may need to bring this to the sleep lab.   4) Covid 19 in may 2022- with protracted FATIUE afterwards.    I would like to thank  Copland, [facility], [address] [place],  [place] [address] for allowing me to meet with and to take care of this pleasant patient.    I plan to follow up either personally or through our NP within 2-3  month.     Electronically signed by: Larey Seat, MD 11/07/2021 9:59 AM  [facility] Board certified by The [employer] of Sleep Medicine and Diplomate of the [employer] of Sleep Medicine. Board certified In Neurology through the [place], Fellow of the [employer] of Neurology. Medical Director of [facility].

## 2021-11-07 NOTE — Patient Instructions (Signed)

## 2021-11-22 ENCOUNTER — Other Ambulatory Visit (HOSPITAL_BASED_OUTPATIENT_CLINIC_OR_DEPARTMENT_OTHER): Payer: Self-pay

## 2021-11-22 ENCOUNTER — Other Ambulatory Visit: Payer: Self-pay | Admitting: Family Medicine

## 2021-11-22 MED ORDER — LEVOCETIRIZINE DIHYDROCHLORIDE 5 MG PO TABS
5.0000 mg | ORAL_TABLET | Freq: Every evening | ORAL | 2 refills | Status: DC
  Filled 2021-11-22: qty 30, 30d supply, fill #0
  Filled 2022-02-05 – 2022-03-24 (×2): qty 30, 30d supply, fill #1

## 2021-11-22 MED ORDER — FLUTICASONE PROPIONATE 50 MCG/ACT NA SUSP
2.0000 | Freq: Every day | NASAL | 6 refills | Status: DC
  Filled 2021-11-22: qty 16, 30d supply, fill #0
  Filled 2022-02-05: qty 16, 30d supply, fill #1

## 2021-11-27 ENCOUNTER — Other Ambulatory Visit (HOSPITAL_BASED_OUTPATIENT_CLINIC_OR_DEPARTMENT_OTHER): Payer: Self-pay

## 2021-11-28 ENCOUNTER — Ambulatory Visit: Payer: No Typology Code available for payment source | Admitting: Family Medicine

## 2021-12-02 ENCOUNTER — Telehealth: Payer: Self-pay | Admitting: Neurology

## 2021-12-02 NOTE — Telephone Encounter (Signed)
Patient requesting call back regarding CPAP or follow up apt. Stated she was told to wait a couple weeks and she did but still has not heard back. Okay to leave detailed msg [phone]

## 2021-12-09 ENCOUNTER — Telehealth: Payer: Self-pay | Admitting: Neurology

## 2021-12-09 NOTE — Telephone Encounter (Signed)
LVM for pt to call back to schedule  [facility] no auth req spoke to [place] ref # [id number]

## 2021-12-10 NOTE — Telephone Encounter (Signed)
Patient returned my call she is scheduled at [facility] for 12/31/21 at 8 pm.  Mailed packet to the patient.  [facility]- [facility] no Josem Kaufmann req spoke to [place] ref # [id number]

## 2021-12-16 ENCOUNTER — Encounter: Payer: No Typology Code available for payment source | Admitting: Family Medicine

## 2021-12-17 NOTE — Patient Instructions (Signed)
Good to see you again today!  Flu shot this fall

## 2021-12-17 NOTE — Progress Notes (Unsigned)
[place] [facility] [address], [place], [place] [address] [phone]  Date:  12/19/2021   Name:  Lelania Bia Nagele   DOB:  1955/10/16   MRN:  [id number]  PCP:  Darreld Mclean, MD    Chief Complaint: No chief complaint on file.   History of Present Illness:  Tava Peery Ezekiel is a 66 y.o. very pleasant female patient who presents with the following:  Pt seen today for a CPE- history of osteopenia, vit D def, cough variant asthma  Last seen by myself in May for foot pain  Dexa 1/21 Mammo 12/22 Colon 10/16  Seen by neurology regarding possible OSA in late July: 1) what we are investigating today is insomnia, nonrestorative sleep but it seems no longer be dominated by menstrual or menopausal changes much less so than 2 years ago, there is also less nocturia than when I met Mrs. Idleman before.  She has no trouble initiating sleep she has trouble to stay asleep.  So I am investigating if she may have upper airway resistance syndrome, if she has an unspecified form of sleep apnea, if there are cardiac arrhythmias at night, if there are periodic limb movements at night.  I am less inclined to order a home sleep test because I am just not looking at apnea rule and rule out I want to see what leads to her arousals.  And she has clearly stated that she arouses from sleep and then goes to the bathroom and it is not the urge to void that wakes her.  To see other physiologic factors in this form of insomnia I need to have an attended sleep study. 2) I suggested to discuss external topical or intravaginal use of progesterone, which is associated with a low cancer risk. She will discuss with Dr Nadiya Pieratt/ Dr. At physician for women.  3) insomnia had not responded much to trazodone. She is sensitive and wakes with headaches- She may need to bring this to the sleep lab.  Labs done by myself in January  Visit with rheumatology earlier  this month- low + ANA.  Likely FBM as culprit in her body pains    Patient Active Problem List   Diagnosis Date Noted   Postviral fatigue syndrome 11/07/2021   Menopausal syndrome (hot flashes) 11/07/2021   Other insomnia 11/07/2021   Osteopenia 05/12/2019   Synovitis of left foot 05/25/2018   Low back pain 12/12/2016   Vitamin D deficiency    Cough variant asthma    Lumbar radiculopathy 10/22/2011   SCIATICA, LEFT 08/09/2009   Disorder of bone and cartilage 08/09/2009    Past Medical History:  Diagnosis Date   Adenomatous colon polyp    tubular   CHICKENPOX, HX OF    OSTEOPENIA    SCIATICA, LEFT 08/09/2009   s/p PT, NSAIDs   Unspecified vitamin D deficiency     Past Surgical History:  Procedure Laterality Date   Arthroscopic shoulder  2008   CERVICAL FUSION  2006   CESAREAN SECTION  88 & 93   x's 2   TONSILLECTOMY  1965    Social History   Tobacco Use   Smoking status: Never   Smokeless tobacco: Never  Substance Use Topics   Alcohol use: No    Alcohol/week: 0.0 standard drinks of alcohol   Drug use: No    Family History  Problem Relation Age of Onset   Memory loss Mother  Hypertension Mother    Hyperlipidemia Mother    Arthritis Other        grandparent   Breast cancer Maternal Grandmother    Seizures Son     Allergies  Allergen Reactions   Sulfonamide Derivatives     REACTION: unspecified; as child    Medication list has been reviewed and updated.  Current Outpatient Medications on File Prior to Visit  Medication Sig Dispense Refill   Cholecalciferol (VITAMIN D) 2000 UNITS CAPS Take by mouth.     DULoxetine (CYMBALTA) 20 MG capsule Take 1 capsule (20 mg total) by mouth daily. 30 capsule 3   fluticasone (FLONASE) 50 MCG/ACT nasal spray Place 2 sprays into both nostrils daily. 16 g 6   gabapentin (NEURONTIN) 100 MG capsule Take 2 capsules (200 mg total) by mouth at bedtime. (Patient taking differently: Take 300 mg by mouth at bedtime.) 180  capsule 1   levocetirizine (XYZAL) 5 MG tablet Take 1 tablet (5 mg total) by mouth every evening. 30 tablet 2   No current facility-administered medications on file prior to visit.    Review of Systems:  As per HPI- otherwise negative.   Physical Examination: There were no vitals filed for this visit. There were no vitals filed for this visit. There is no height or weight on file to calculate BMI. Ideal Body Weight:    GEN: no acute distress. HEENT: Atraumatic, Normocephalic.  Ears and Nose: No external deformity. CV: RRR, No M/G/R. No JVD. No thrill. No extra heart sounds. PULM: CTA B, no wheezes, crackles, rhonchi. No retractions. No resp. distress. No accessory muscle use. ABD: S, NT, ND, +BS. No rebound. No HSM. EXTR: No c/c/e PSYCH: Normally interactive. Conversant.    Assessment and Plan: *** Physical exam today- encouraged healthy diet and exercise routine  Signed Lamar Blinks, MD

## 2021-12-19 ENCOUNTER — Other Ambulatory Visit (HOSPITAL_BASED_OUTPATIENT_CLINIC_OR_DEPARTMENT_OTHER): Payer: Self-pay

## 2021-12-19 ENCOUNTER — Encounter: Payer: Self-pay | Admitting: Family Medicine

## 2021-12-19 ENCOUNTER — Ambulatory Visit (INDEPENDENT_AMBULATORY_CARE_PROVIDER_SITE_OTHER): Payer: No Typology Code available for payment source | Admitting: Family Medicine

## 2021-12-19 ENCOUNTER — Other Ambulatory Visit: Payer: Self-pay | Admitting: Family Medicine

## 2021-12-19 VITALS — BP 132/80 | HR 70 | Temp 97.8°F | Resp 18 | Ht 63.0 in | Wt 188.0 lb

## 2021-12-19 DIAGNOSIS — Z130 Encounter for screening for diseases of the blood and blood-forming organs and certain disorders involving the immune mechanism: Secondary | ICD-10-CM | POA: Diagnosis not present

## 2021-12-19 DIAGNOSIS — Z1329 Encounter for screening for other suspected endocrine disorder: Secondary | ICD-10-CM | POA: Diagnosis not present

## 2021-12-19 DIAGNOSIS — Z0000 Encounter for general adult medical examination without abnormal findings: Secondary | ICD-10-CM | POA: Diagnosis not present

## 2021-12-19 DIAGNOSIS — M797 Fibromyalgia: Secondary | ICD-10-CM

## 2021-12-19 DIAGNOSIS — Z13220 Encounter for screening for lipoid disorders: Secondary | ICD-10-CM | POA: Diagnosis not present

## 2021-12-19 DIAGNOSIS — Z131 Encounter for screening for diabetes mellitus: Secondary | ICD-10-CM

## 2021-12-19 DIAGNOSIS — M5416 Radiculopathy, lumbar region: Secondary | ICD-10-CM

## 2021-12-19 DIAGNOSIS — R944 Abnormal results of kidney function studies: Secondary | ICD-10-CM

## 2021-12-19 LAB — COMPREHENSIVE METABOLIC PANEL
ALT: 24 U/L (ref 0–35)
AST: 22 U/L (ref 0–37)
Albumin: 4.2 g/dL (ref 3.5–5.2)
Alkaline Phosphatase: 82 U/L (ref 39–117)
BUN: 18 mg/dL (ref 6–23)
CO2: 30 mEq/L (ref 19–32)
Calcium: 9.3 mg/dL (ref 8.4–10.5)
Chloride: 104 mEq/L (ref 96–112)
Creatinine, Ser: 1.02 mg/dL (ref 0.40–1.20)
GFR: 58.48 mL/min — ABNORMAL LOW (ref >60.00)
Glucose, Bld: 67 mg/dL — ABNORMAL LOW (ref 70–99)
Potassium: 4 mEq/L (ref 3.5–5.1)
Sodium: 141 mEq/L (ref 135–145)
Total Bilirubin: 0.4 mg/dL (ref 0.2–1.2)
Total Protein: 6.1 g/dL (ref 6.0–8.3)

## 2021-12-19 LAB — CBC
HCT: 38.6 % (ref 36.0–46.0)
Hemoglobin: 12.7 g/dL (ref 12.0–15.0)
MCHC: 32.9 g/dL (ref 30.0–36.0)
MCV: 90.3 fl (ref 78.0–100.0)
Platelets: 205 10*3/uL (ref 150.0–400.0)
RBC: 4.27 Mil/uL (ref 3.87–5.11)
RDW: 13.7 % (ref 11.5–15.5)
WBC: 8.1 10*3/uL (ref 4.0–10.5)

## 2021-12-19 LAB — LIPID PANEL
Cholesterol: 219 mg/dL — ABNORMAL HIGH (ref 0–200)
HDL: 74.2 mg/dL (ref >39.00)
LDL Cholesterol: 128 mg/dL — ABNORMAL HIGH (ref 0–99)
NonHDL: 144.37
Total CHOL/HDL Ratio: 3
Triglycerides: 81 mg/dL (ref 0.0–149.0)
VLDL: 16.2 mg/dL (ref 0.0–40.0)

## 2021-12-19 LAB — TSH: TSH: 1.93 u[IU]/mL (ref 0.35–5.50)

## 2021-12-19 LAB — HEMOGLOBIN A1C: Hgb A1c MFr Bld: 5.7 % (ref 4.6–6.5)

## 2021-12-19 MED ORDER — GABAPENTIN 300 MG PO CAPS
300.0000 mg | ORAL_CAPSULE | Freq: Every day | ORAL | 3 refills | Status: DC
  Filled 2021-12-19: qty 90, 90d supply, fill #0
  Filled 2022-03-24: qty 90, 90d supply, fill #1
  Filled 2022-05-07 – 2022-05-12 (×2): qty 90, 90d supply, fill #2

## 2021-12-19 MED ORDER — DULOXETINE HCL 40 MG PO CSDR
40.0000 mg | DELAYED_RELEASE_CAPSULE | Freq: Every day | ORAL | 3 refills | Status: DC
  Filled 2021-12-19 – 2021-12-24 (×2): qty 90, 90d supply, fill #0

## 2021-12-20 ENCOUNTER — Other Ambulatory Visit (HOSPITAL_BASED_OUTPATIENT_CLINIC_OR_DEPARTMENT_OTHER): Payer: Self-pay

## 2021-12-24 ENCOUNTER — Telehealth: Payer: Self-pay

## 2021-12-24 ENCOUNTER — Other Ambulatory Visit (HOSPITAL_BASED_OUTPATIENT_CLINIC_OR_DEPARTMENT_OTHER): Payer: Self-pay

## 2021-12-24 MED ORDER — DULOXETINE HCL 20 MG PO CPEP
20.0000 mg | ORAL_CAPSULE | Freq: Two times a day (BID) | ORAL | 5 refills | Status: DC
  Filled 2021-12-24: qty 60, 30d supply, fill #0
  Filled 2022-02-05: qty 60, 30d supply, fill #1

## 2021-12-24 NOTE — Telephone Encounter (Signed)
Called downstairs, and her Duloxetine 40 mg needs a PA.   Saraiah asks if it would make a difference to send 20 mg BID?

## 2021-12-24 NOTE — Addendum Note (Signed)
Addended by: Lamar Blinks C on: 12/24/2021 02:06 PM   Modules accepted: Orders

## 2021-12-31 ENCOUNTER — Ambulatory Visit (INDEPENDENT_AMBULATORY_CARE_PROVIDER_SITE_OTHER): Payer: No Typology Code available for payment source | Admitting: Neurology

## 2021-12-31 DIAGNOSIS — G9331 Postviral fatigue syndrome: Secondary | ICD-10-CM

## 2021-12-31 DIAGNOSIS — N951 Menopausal and female climacteric states: Secondary | ICD-10-CM

## 2021-12-31 DIAGNOSIS — G4709 Other insomnia: Secondary | ICD-10-CM

## 2021-12-31 DIAGNOSIS — G4733 Obstructive sleep apnea (adult) (pediatric): Secondary | ICD-10-CM

## 2022-01-13 ENCOUNTER — Telehealth: Payer: Self-pay

## 2022-01-13 NOTE — Procedures (Signed)
[facility] POLYSOMNOGRAPHY  INTERPRETATION REPORT   STUDY DATE:  12/31/2021     PATIENT NAME:  Shirlean Mylar Sakurai         DATE OF BIRTH:  1955/09/06  PATIENT ID:  [id number]    TYPE OF STUDY:  PSG  READING PHYSICIAN: Larey Seat, MD REFERRED BY: Dr. 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 RN but daytime worker. Past medical history:  shift work history, mother of 25, menopausal since age 16, limited caffeine use, no tobacco, no alcohol. Treated for Vit D deficiency. Positive ANA.   ADDITIONAL INFORMATION:  The Epworth Sleepiness Scale endorsed at at  8 /24 points (scores above or equal to 10 are suggestive of hypersomnolence). FSS endorsed at  63/63points.  Height: 63 in Weight: 188 lb (BMI 33) Neck Size: 15 in  MEDICATIONS: Vitamin D, Cymbalta, Neurontin TECHNICAL DESCRIPTION: A registered sleep technologist ( RPSGT)  was in attendance for the duration of the recording.  Data collection, scoring, video monitoring, and reporting were performed in compliance with the AASM Manual for the Scoring of Sleep and Associated Events; (Hypopnea is scored based on the criteria listed in Section VIII D. 1b in the AASM Manual V2.6 using a 4% oxygen desaturation rule or Hypopnea is scored based on the criteria listed in Section VIII D. 1a in the AASM Manual V2.6 using 3% oxygen desaturation and /or arousal rule).   SLEEP CONTINUITY AND SLEEP ARCHITECTURE:  Lights-out was at 20:47: and lights-on at  05:04:, with  8.3 minutes of recording time . Total sleep time ( TST) was 403.0 minutes with a decreased sleep efficiency at 81.0%. Sleep latency was normal  at 28.0 minutes.  REM sleep latency was increased at 340.0 minutes.  Of the total sleep time, the percentage of stage N1 sleep was 2.4%, stage N2 sleep was 55%, stage N3 sleep was 35.7%, and REM sleep was 7.1%.  Wake after sleep onset (WASO) time accounted for 66 minutes. BODY POSITION:  TST was divided between the following sleep positions: : supine 296 minutes (74%), non-supine 107 minutes (26%); right 106 minutes (26%), left 00 minutes (0%), and prone 00 minutes (0%).  Total supine REM sleep time was 28 minutes (100% of total REM sleep).  2 REM sleep episodes were recorded this study night, 8 awakenings (i.e. transitions to Stage W from any sleep stage), and 37 total stage transitions. RESPIRATORY MONITORING:  Based on AASM criteria (using a 3% oxygen desaturation and /or arousal rule for scoring hypopneas), there were 3 apneas (3 obstructive; 0 central; 0 mixed), and 31 hypopneas. Apnea index was 0.4. Hypopnea index was 4.6. The apnea-hypopnea index was 5.1/h. (AHI), divided into AHI of 6.9/h in supine, and the non-supine sleep AHI was 0.0/h. OSA apnea was further divided into REM AHI of 35.8/h and NREM sleep AHI of 0.8/h .  There were 0 respiratory effort-related arousals (RERAs) scored.  OXIMETRY: Oxyhemoglobin Saturation Nadir during sleep was at  85% from a mean of 94%.  Of the Total sleep time (TST)  hypoxemia (<89%) was present for  0.6 minutes, or 0.2% of total sleep time.  LIMB MOVEMENTS: There were 37 periodic limb movements of  sleep (5.5/hr), of which 0 (0.0/hr) were associated with an arousal. AROUSAL: There were 22 arousals in total, for an arousal index of 3/hour.  Of these, 4 were identified as respiratory-related arousals (1 /h), 0 were PLM-related arousals (0 /h), and 22 were non-specific arousals (3 /h). There were 0 occurrences of Cheyne Stokes breathing.  Snoring was classified as mild to moderate. EEG:  PSG EEG was of normal amplitude and frequency, with symmetric manifestation of  sleep stages. EKG: The electrocardiogram documented NSR .  The average heart rate during sleep was 69 bpm.  The heart rate during sleep varied between 57 bpm and 83 bpm.  AUDIO and VIDEO: No complex movements, vocalization or dream enactment was recorded.   IMPRESSION: 1) Sleep disordered breathing was present. Obstructive Sleep apnea was seen at mild degree and strongly supine and REM sleep dependent. snoring was present and hypoxia was not clinically relevant.     2) Periodic limb movements were not seen.  3) Total sleep time was within normal limits at 403.0 minutes.  Sleep efficiency was decreased at 81.0%.   The overall findings were that of good sleep architecture, treatment for mild sleep apnea is generally optional.   RECOMMENDATIONS: auto CPAP ResMed device with a setting to treat mainly mild OSA and snoring, 5-12 cm water, 1 cm EPR and interface of patient's comfort. can be nasal pillow with chinstrap if she likes it.  1) snoring and mild, but strongly REM sleep dependent OSA would best be treated with a PAP therapy device. I like for the patient to consider this- 90 days should allow symptom impact under PAP therapy to be notable if mild apnea is the cause of fatigue.  Larey Seat,  MD

## 2022-01-13 NOTE — Telephone Encounter (Signed)
-----   Message from Larey Seat, MD sent at 01/13/2022  2:20 PM EDT ----- Apnea index was 0.4. Hypopnea index was 4.6. The apnea-hypopnea index was 5.1/h. (AHI), divided into AHI of 6.9/h in supine, and the non-supine sleep AHI was 0.0/h. OSA apnea was further divided into REM AHI of 35.8/h and NREM sleep AHI of 0.8/h. IMPRESSION: 1) Sleep disordered breathing was present. Obstructive Sleep apnea was seen at mild degree and strongly supine and REM sleep dependent. snoring was present and hypoxia was not clinically relevant.   2) Periodic limb movements were not seen.  3)Total sleep time was within normal limitsat 403.42mnutes. Sleep efficiency was decreasedat 81.0%.The overall findings were that of good sleep continuity, treatment for mild sleep apnea is generally optional.   RECOMMENDATIONS: auto CPAP ResMed device with a setting to treat mainly mild OSA and snoring, 5-12 cm water, 1 cm EPR and interface of patient's comfort. can be nasal pillow with chinstrap if she likes it.  1) snoring and mild, but strongly REM sleep dependent OSA would best be treated with a PAP therapy device. I like for the patient to consider this- 90 days should allow symptom impactunder PAP therapy to be notable if mild apnea is the cause of fatigue.  CarmenDohmeier, MD

## 2022-01-13 NOTE — Addendum Note (Signed)
Addended by: Larey Seat on: 01/13/2022 02:20 PM   Modules accepted: Orders

## 2022-01-13 NOTE — Telephone Encounter (Signed)
I called pt. I advised pt that Dr. Brett Fairy reviewed their sleep study results and found that pt has mild osa. Dr. Brett Fairy recommends that pt start an auto-pap at home. I reviewed PAP compliance expectations with the pt. Pt is agreeable to starting an auto-PAP. I advised pt that an order will be sent to a DME, [facility], and [facility] will call the pt within about one week after they file with the pt's insurance. [facility] will show the pt how to use the machine, fit for masks, and troubleshoot the auto-PAP if needed. A follow up appt was made for insurance purposes with Judson Roch, NP on 05/01/22 at 12:45pm. Pt verbalized understanding to arrive 15 minutes early and bring their auto-PAP. A letter with all of this information in it will be mailed to the pt as a reminder. I verified with the pt that the address we have on file is correct. Pt verbalized understanding of results. Pt had no questions at this time but was encouraged to call back if questions arise. I have sent the order to [facility] and have received confirmation that they have received the order.

## 2022-01-29 NOTE — Progress Notes (Unsigned)
[facility] [place] Phone: [phone] Subjective:   Fontaine No, am serving as a scribe for Dr. Hulan Saas.  I'm seeing this patient by the request  of:  Copland, Gay Filler, MD  CC: Right foot pain  [id number]  January Bergthold Matto is a 66 y.o. female coming in with complaint of R foot pain for past 8 months. Pain over arch and heel during the day and at night. When lying down in bed she cannot put pressure on the heel and will have a burning sensation. Patient states that pain worsens after she has been on her feet. Patient is on her feet 10 hours a day and wears HOKA for work. Uses OOFOS slides when at home. L foot will bother her occasionally as well. Overall she said that both of her feet ache by 10:00am during work days. Has seen rheumatology and was diagnosed with fibromyalgia. Autoimmune labs were normal with one titer slightly abnormal. Using gabapentin and Duloxetine.      Past Medical History:  Diagnosis Date   Adenomatous colon polyp    tubular   CHICKENPOX, HX OF    OSTEOPENIA    SCIATICA, LEFT 08/09/2009   s/p PT, NSAIDs   Unspecified vitamin D deficiency    Past Surgical History:  Procedure Laterality Date   Arthroscopic shoulder  2008   CERVICAL FUSION  2006   CESAREAN SECTION  88 & 93   x's 2   TONSILLECTOMY  1965   Social History   Socioeconomic History   Marital status: Married    Spouse name: Not on file   Number of children: Not on file   Years of education: Not on file   Highest education level: Not on file  Occupational History   Not on file  Tobacco Use   Smoking status: Never   Smokeless tobacco: Never  Substance and Sexual Activity   Alcohol use: No    Alcohol/week: 0.0 standard drinks of alcohol   Drug use: No   Sexual activity: Not on file  Other Topics Concern   Not on file  Social History Narrative   Married, lives with spouse and kids   Social Determinants of  Health   Financial Resource Strain: Not on file  Food Insecurity: Not on file  Transportation Needs: Not on file  Physical Activity: Not on file  Stress: Not on file  Social Connections: Not on file   Allergies  Allergen Reactions   Sulfonamide Derivatives     REACTION: unspecified; as child   Family History  Problem Relation Age of Onset   Memory loss Mother    Hypertension Mother    Hyperlipidemia Mother    Arthritis Other        grandparent   Breast cancer Maternal Grandmother    Seizures Son       Current Outpatient Medications (Respiratory):    fluticasone (FLONASE) 50 MCG/ACT nasal spray, Place 2 sprays into both nostrils daily.   levocetirizine (XYZAL) 5 MG tablet, Take 1 tablet (5 mg total) by mouth every evening.    Current Outpatient Medications (Other):    Cholecalciferol (VITAMIN D) 2000 UNITS CAPS, Take by mouth.   DULoxetine (CYMBALTA) 20 MG capsule, Take 1 capsule (20 mg total) by mouth 2 (two) times daily.   DULoxetine HCl 40 MG CSDR, Take 1 capsule by mouth everyday   gabapentin (NEURONTIN) 300 MG capsule, Take 1 capsule (300 mg total) by mouth  at bedtime.   Reviewed prior external information including notes and imaging from  primary care provider As well as notes that were available from care everywhere and other healthcare systems.  Past medical history, social, surgical and family history all reviewed in electronic medical record.  No pertanent information unless stated regarding to the chief complaint.   Review of Systems:  No headache, visual changes, nausea, vomiting, diarrhea, constipation, dizziness, abdominal pain, skin rash, fevers, chills, night sweats, weight loss, swollen lymph nodes, body aches, joint swelling, chest pain, shortness of breath, mood changes. POSITIVE muscle aches  Objective  Blood pressure 110/78, pulse 76, height '5\' 3"'$  (1.6 m), weight 193 lb (87.5 kg), SpO2 97 %.   General: No apparent distress alert and oriented x3  mood and affect normal, dressed appropriately.  HEENT: Pupils equal, extraocular movements intact  Respiratory: Patient's speak in full sentences and does not appear short of breath  Cardiovascular: No lower extremity edema, non tender, no erythema  Right foot exam shows the patient is tender to palpation of the medial aspect of the foot.  Positive Tinel's sign noted.  Mild pain over the navicular bone itself.  Rigid midfoot noted.  Overpronation of the hindfoot noted.  Patient does have tightness of the posterior tibialis tendon on the right compared to the left.  Limited muscular skeletal ultrasound was performed and interpreted by Hulan Saas, M  Limited ultrasound shows significant hypoechoic changes near the tarsal tunnel on the medial aspect as well as within the posterior tibialis tendon sheath noted.  No significant tearing of the tendon noted.  Navicular bone does not have any true cortical irregularity of the mild increase in Doppler flow. Impression: Consistent with tarsal tunnel that is either secondary to mild arthritic changes or posterior tibialis tendinitis.  Procedure: Real-time Ultrasound Guided Injection of right posterior tibialis tendon sheath Device: GE Logiq Q7 Ultrasound guided injection is preferred based studies that show increased duration, increased effect, greater accuracy, decreased procedural pain, increased response rate, and decreased cost with ultrasound guided versus blind injection.  Verbal informed consent obtained.  Time-out conducted.  Noted no overlying erythema, induration, or other signs of local infection.  Skin prepped in a sterile fashion.  Local anesthesia: Topical Ethyl chloride.  With sterile technique and under real time ultrasound guidance: With a 25-gauge half inch needle injecting 0.5 cc of 0.5% Marcaine and 0.5 cc of Kenalog 40 mg into the tendon sheath and then transition into the tarsal tunnel area. Completed without difficulty  Pain  immediately resolved suggesting accurate placement of the medication.  Advised to call if fevers/chills, erythema, induration, drainage, or persistent bleeding.  Impression: Technically successful ultrasound guided injection.    Impression and Recommendations:    The above documentation has been reviewed and is accurate and complete Lyndal Pulley, DO

## 2022-01-30 ENCOUNTER — Ambulatory Visit: Payer: Self-pay

## 2022-01-30 ENCOUNTER — Ambulatory Visit (INDEPENDENT_AMBULATORY_CARE_PROVIDER_SITE_OTHER): Payer: No Typology Code available for payment source | Admitting: Family Medicine

## 2022-01-30 ENCOUNTER — Encounter: Payer: Self-pay | Admitting: Family Medicine

## 2022-01-30 VITALS — BP 110/78 | HR 76 | Ht 63.0 in | Wt 193.0 lb

## 2022-01-30 DIAGNOSIS — G5751 Tarsal tunnel syndrome, right lower limb: Secondary | ICD-10-CM | POA: Diagnosis not present

## 2022-01-30 DIAGNOSIS — M79671 Pain in right foot: Secondary | ICD-10-CM

## 2022-01-30 NOTE — Assessment & Plan Note (Signed)
Patient given injection and tolerated the procedure well.  We discussed with patient about proper shoes, continuing duloxetine at the moment as well as the gabapentin.  Concerned that there could be some compression.  We will get x-rays of the right to further evaluate the arthritic changes could be contributing.  Differential includes more of a posterior tibialis tendinitis.  Discussed treatment, and compression given as well.  Follow-up again in 6 to 8 weeks

## 2022-01-30 NOTE — Patient Instructions (Addendum)
Pneumatic heel brace Injected foot today Exercises 3x a week Ultra hiking shoes Spenco Total Support Orthotics See me in 5-6 weeks

## 2022-02-05 ENCOUNTER — Other Ambulatory Visit: Payer: Self-pay

## 2022-02-05 ENCOUNTER — Other Ambulatory Visit (HOSPITAL_BASED_OUTPATIENT_CLINIC_OR_DEPARTMENT_OTHER): Payer: Self-pay

## 2022-02-05 MED ORDER — DULOXETINE HCL 20 MG PO CPEP: 20.0000 mg | ORAL_CAPSULE | Freq: Two times a day (BID) | ORAL | 5 refills | Status: DC

## 2022-02-14 ENCOUNTER — Other Ambulatory Visit: Payer: Self-pay | Admitting: Family Medicine

## 2022-02-14 DIAGNOSIS — Z139 Encounter for screening, unspecified: Secondary | ICD-10-CM

## 2022-03-13 ENCOUNTER — Ambulatory Visit: Payer: No Typology Code available for payment source | Admitting: Family Medicine

## 2022-03-14 ENCOUNTER — Encounter: Payer: Self-pay | Admitting: Family Medicine

## 2022-03-14 ENCOUNTER — Other Ambulatory Visit: Payer: Self-pay | Admitting: Family Medicine

## 2022-03-14 ENCOUNTER — Ambulatory Visit (INDEPENDENT_AMBULATORY_CARE_PROVIDER_SITE_OTHER): Payer: No Typology Code available for payment source | Admitting: Family Medicine

## 2022-03-14 ENCOUNTER — Other Ambulatory Visit (HOSPITAL_BASED_OUTPATIENT_CLINIC_OR_DEPARTMENT_OTHER): Payer: Self-pay

## 2022-03-14 VITALS — BP 110/80 | HR 77 | Resp 16 | Ht 63.0 in | Wt 188.0 lb

## 2022-03-14 DIAGNOSIS — K649 Unspecified hemorrhoids: Secondary | ICD-10-CM | POA: Diagnosis not present

## 2022-03-14 DIAGNOSIS — M797 Fibromyalgia: Secondary | ICD-10-CM | POA: Diagnosis not present

## 2022-03-14 DIAGNOSIS — R195 Other fecal abnormalities: Secondary | ICD-10-CM

## 2022-03-14 MED ORDER — AMITRIPTYLINE HCL 10 MG PO TABS
10.0000 mg | ORAL_TABLET | Freq: Every day | ORAL | 2 refills | Status: DC
  Filled 2022-03-14: qty 30, 30d supply, fill #0
  Filled 2022-04-09: qty 30, 30d supply, fill #1
  Filled 2022-05-16: qty 30, 30d supply, fill #2

## 2022-03-14 MED ORDER — HYDROCORTISONE 2.5 % EX CREA
TOPICAL_CREAM | Freq: Two times a day (BID) | CUTANEOUS | 0 refills | Status: DC
  Filled 2022-03-14: qty 30, 30d supply, fill #0

## 2022-03-14 NOTE — Patient Instructions (Signed)
If you do not hear anything about your referral in the next 1-2 weeks, call our office and ask for an update.  Take Metamucil or Benefiber daily.  Use the cream daily. Speak with Dr. Lorelei Pont as this sounds like a thrombosed hemorrhoid.   Let us know if you need anything.

## 2022-03-14 NOTE — Progress Notes (Signed)
CC: Stool changes  Subjective: Patient is a 66 y.o. female here for abdominal issues.  For nearly 1 year, the patient has been having increased stress levels.  She also has been having increased stool frequency with some looser aspects than others.  She feels she has been having incomplete emptying as well.  She has a painful hemorrhoid.  No diarrhea, bleeding, discharge, nausea, vomiting, abdominal pain.  Additionally, she has a history of fibromyalgia with her PCP prescribed Cymbalta.  It was not particular helpful and she has since stopped.  Her last colonoscopy was in 2016 and was reportedly normal with suggestion for 10-year follow-up.  Past Medical History:  Diagnosis Date   Adenomatous colon polyp    tubular   CHICKENPOX, HX OF    OSTEOPENIA    SCIATICA, LEFT 08/09/2009   s/p PT, NSAIDs   Unspecified vitamin D deficiency     Objective: BP 110/80 (BP Location: Right Arm, Patient Position: Sitting, Cuff Size: Normal)   Pulse 77   Resp 16   Ht '5\' 3"'$  (1.6 m)   Wt 188 lb (85.3 kg)   SpO2 99%   BMI 33.30 kg/m  General: Awake, appears stated age Heart: RRR, no LE edema Lungs: CTAB, no rales, wheezes or rhonchi. No accessory muscle use Abdomen: Bowel sounds present, soft, nontender, nondistended Rectal: Patient politely declined exam Psych: Age appropriate judgment and insight, normal affect and mood  Assessment and Plan: Change in stool - Plan: Ambulatory referral to Gastroenterology, amitriptyline (ELAVIL) 10 MG tablet  Fibromyalgia - Plan: amitriptyline (ELAVIL) 10 MG tablet  Hemorrhoids, unspecified hemorrhoid type - Plan: hydrocortisone 2.5 % cream  Chronic, uncontrolled.  Refer to gastroenterology.  Could be IBS, will trial Elavil 10 mg daily.  Consider daily fiber supplement.  Stay hydrated. Chronic, uncontrolled.  She has not been taking her Cymbalta as it was not particularly helpful.  We will see how 10 mg nightly Elavil works. As we do work together, she does not  want me examining her rectal area.  I am fairly certain her regular PCP removes clots from thrombosed hemorrhoids and she will check in next week.  I will send prescription strength hydrocortisone cream to use for relief in the meantime.  Epsom salt baths/soaks recommended as well. The patient voiced understanding and agreement to the plan.  [place], DO 03/14/22  8:28 AM

## 2022-03-17 ENCOUNTER — Ambulatory Visit (INDEPENDENT_AMBULATORY_CARE_PROVIDER_SITE_OTHER): Payer: No Typology Code available for payment source | Admitting: Family Medicine

## 2022-03-17 ENCOUNTER — Telehealth: Payer: Self-pay

## 2022-03-17 ENCOUNTER — Other Ambulatory Visit (HOSPITAL_BASED_OUTPATIENT_CLINIC_OR_DEPARTMENT_OTHER): Payer: Self-pay

## 2022-03-17 VITALS — BP 132/80 | HR 74 | Resp 18 | Ht 63.0 in | Wt 188.0 lb

## 2022-03-17 DIAGNOSIS — K644 Residual hemorrhoidal skin tags: Secondary | ICD-10-CM

## 2022-03-17 DIAGNOSIS — R194 Change in bowel habit: Secondary | ICD-10-CM | POA: Diagnosis not present

## 2022-03-17 MED ORDER — HYDROCORTISONE ACETATE 25 MG RE SUPP
25.0000 mg | Freq: Two times a day (BID) | RECTAL | 0 refills | Status: DC
  Filled 2022-03-17: qty 24, 12d supply, fill #0

## 2022-03-17 NOTE — Telephone Encounter (Signed)
-----   Message from Yetta Flock, MD sent at 03/17/2022  3:20 PM EST ----- Jan can you help book this patient with me or APP in the next few weeks?  I can add her on 11/30 at 1130 if she can do that. Thanks  Dr. 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 We placed a new referral for her to see you, she is now having some change in her bowel pattern habits and recently developed a thrombosed hemorrhoid.  The hemorrhoid is getting better so we are treating it conservatively.  However, I wondered if you might be able to ask your staff to get her scheduled to see you to discuss changes in her bowels!  Thank you so much and happy Thanksgiving!  Jess

## 2022-03-17 NOTE — Patient Instructions (Signed)
I will get in touch with Dr Havery Moros and ask him to get you in!  Try the hydrocortisone suppositories if they are covered by your insurance For the hemorrhoid- try sitting in warm bath, keep the area quite clean, witch hazel like tucks pads may help  Let me know if you need anything!

## 2022-03-17 NOTE — Telephone Encounter (Signed)
Called and Left a message for the patient re: 11:30am appointment on 11-30 Thursday. Asker her to come 10 minutes early. Asked her to call and confirm or call and reschedule

## 2022-03-17 NOTE — Progress Notes (Signed)
[facility] [place], [place], [place] [address] [phone]  Date:  03/17/2022   Name:  Kathy King   DOB:  [birth year]   MRN:  [id number]  PCP:  Darreld Mclean, MD    Chief Complaint: Hemorrhoids   History of Present Illness:  Kathy King is a 66 y.o. very pleasant female patient who presents with the following:  She has noted bowel movement changes- she may have more frequent stools and feels like she does not get everything out at once  She has noted this for maybe 6 months or longer  She feels like she might have a hemorrhoid- her husband looked and saw what sounds like a thrombosed hemorrhoid   She did a colon in 2016 per Dr Havery Moros- it was normal, 10 year recall  She may have a little BRBPR on occasion if she has to have several BM- like a pink streak   Wt Readings from Last 3 Encounters:  03/17/22 188 lb (85.3 kg)  03/14/22 188 lb (85.3 kg)  01/30/22 193 lb (87.5 kg)   Looking back she has suffered from what sounds like IBS since childhood She competed in roller skating dance as a child and would have nervous diarrhea!    Patient Active Problem List   Diagnosis Date Noted   Fibromyalgia 03/14/2022   Tarsal tunnel syndrome of right side 01/30/2022   Postviral fatigue syndrome 11/07/2021   Menopausal syndrome (hot flashes) 11/07/2021   Other insomnia 11/07/2021   Osteopenia 05/12/2019   Synovitis of left foot 05/25/2018   Low back pain 12/12/2016   Vitamin D deficiency    Cough variant asthma    Lumbar radiculopathy 10/22/2011   SCIATICA, LEFT 08/09/2009   Disorder of bone and cartilage 08/09/2009    Past Medical History:  Diagnosis Date   Adenomatous colon polyp    tubular   CHICKENPOX, HX OF    OSTEOPENIA    SCIATICA, LEFT 08/09/2009   s/p PT, NSAIDs   Unspecified vitamin D deficiency     Past Surgical History:  Procedure Laterality Date   Arthroscopic  shoulder  2008   CERVICAL FUSION  2006   CESAREAN SECTION  88 & 93   x's 2   TONSILLECTOMY  1965    Social History   Tobacco Use   Smoking status: Never   Smokeless tobacco: Never  Substance Use Topics   Alcohol use: No    Alcohol/week: 0.0 standard drinks of alcohol   Drug use: No    Family History  Problem Relation Age of Onset   Memory loss Mother    Hypertension Mother    Hyperlipidemia Mother    Arthritis Other        grandparent   Breast cancer Maternal Grandmother    Seizures Son     Allergies  Allergen Reactions   Sulfonamide Derivatives     REACTION: unspecified; as child    Medication list has been reviewed and updated.  Current Outpatient Medications on File Prior to Visit  Medication Sig Dispense Refill   amitriptyline (ELAVIL) 10 MG tablet Take 1 tablet (10 mg total) by mouth at bedtime. 30 tablet 2   Cholecalciferol (VITAMIN D) 2000 UNITS CAPS Take by mouth.     fluticasone (FLONASE) 50 MCG/ACT nasal spray Place 2 sprays into both nostrils daily. 16 g 6   gabapentin (NEURONTIN) 300 MG capsule Take 1 capsule (300 mg total)  by mouth at bedtime. 90 capsule 3   hydrocortisone 2.5 % cream Apply topically 2 (two) times daily. 30 g 0   levocetirizine (XYZAL) 5 MG tablet Take 1 tablet (5 mg total) by mouth every evening. 30 tablet 2   No current facility-administered medications on file prior to visit.    Review of Systems:  As per HPI- otherwise negative.   Physical Examination: Vitals:   03/17/22 1445  BP: 132/80  Pulse: 74  Resp: 18  SpO2: 98%   Vitals:   03/17/22 1445  Weight: 188 lb (85.3 kg)  Height: '5\' 3"'$  (1.6 m)   Body mass index is 33.3 kg/m. Ideal Body Weight: Weight in (lb) to have BMI = 25: 140.8  GEN: no acute distress. HEENT: Atraumatic, Normocephalic.  Ears and Nose: No external deformity. CV: RRR, No M/G/R. No JVD. No thrill. No extra heart sounds. PULM: CTA B, no wheezes, crackles, rhonchi. No retractions. No resp.  distress. No accessory muscle use. ABD: S, NT, ND. No rebound. No HSM. EXTR: No c//c/e PSYCH: Normally interactive. Conversant.  A thrombosed hemorrhoid is present at 3:00, blueberry sized.  It is no longer very tender.  Patient notes it was more tender a few days ago.  No evidence of current bleeding  Assessment and Plan: External hemorrhoid - Plan: hydrocortisone (ANUSOL-HC) 25 MG suppository  Bowel habit changes  Patient seen today within external thrombosed hemorrhoid.  This came up last week and was quite tender at first, it is now less tender.  Advised patient that at this point excision is not likely to offer her any benefit.  She would prefer conservative management in this case.  Recommended sitz baths, witch hazel, prescribed hydrocortisone suppositories.  She will let me know if this is getting worse again  Patient also notes that her bowel habits seem to have changed over the last few months, she notes that she oftentimes will pass a small amount of stool frequently throughout the day.  She feels as though she is evacuating incompletely.  Looking back, IBS type symptoms both constipation and diarrhea have been present since she was a child  Referral back to GI for further evaluation  Signed Lamar Blinks, MD

## 2022-03-24 ENCOUNTER — Other Ambulatory Visit (HOSPITAL_BASED_OUTPATIENT_CLINIC_OR_DEPARTMENT_OTHER): Payer: Self-pay

## 2022-03-27 ENCOUNTER — Ambulatory Visit (INDEPENDENT_AMBULATORY_CARE_PROVIDER_SITE_OTHER): Payer: No Typology Code available for payment source | Admitting: Gastroenterology

## 2022-03-27 ENCOUNTER — Encounter: Payer: Self-pay | Admitting: Gastroenterology

## 2022-03-27 VITALS — BP 130/80 | HR 65 | Ht 63.0 in | Wt 195.0 lb

## 2022-03-27 DIAGNOSIS — K649 Unspecified hemorrhoids: Secondary | ICD-10-CM

## 2022-03-27 DIAGNOSIS — R194 Change in bowel habit: Secondary | ICD-10-CM

## 2022-03-27 MED ORDER — CITRUCEL PO POWD: 1.0000 | Freq: Every day | ORAL | Status: DC

## 2022-03-27 MED ORDER — CALMOL-4 76-10 % RE SUPP: RECTAL | 0 refills | Status: DC

## 2022-03-27 NOTE — Progress Notes (Signed)
HPI :  66 year old female history of sciatica, osteopenia, fibromyalgia, here to reestablish care, referred by Silvestre Mesi, for symptomatic hemorrhoids and problems with her bowels.  She reports having a history of what she thinks is IBS for several years.  She can have fluctuations in her bowel habits.  However over the past several months she has had more persistent changes that have been bothering her more so than usual.  She feels that she does not evacuate easily or well.  She will have an urge to go to the bathroom and feels like she cannot get everything out, I will take her 4-5 trips to the bathroom to completely evacuate all the stool and she has a hard time cleaning herself afterwards.  She denies any significant straining.  She denies any loose bowels with this or hard stools.  Stool is typically soft.  No blood in her stools.  She can have some lower abdominal cramping with the urge to use the bathroom.  She thinks perhaps 2 weeks ago she had developed a significant hemorrhoid that was large and painful.  Had tried some over-the-counter suppositories which did not help too much.  More recently started Anusol twice daily and she thinks that has helped some of the pain over the past week or so.  She states it feels like there is a marble in her perianal area that is tender to the touch.  She denies any changes in medicines prior to onset of this occurrence.  She has recently changed her fibromyalgia regimen from Cymbalta to Elavil perhaps 10 days or so ago.  She has been on gabapentin for the past year or so for fibromyalgia.  No family history of colon cancer.  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  She finds it bothersome to any to have the urge to go to the bathroom during the day and use the bathroom so many times.  She has not tried sitz bath yet but however has tried some bath in general.  Her last colonoscopy with me in 2016 was normal.   Last seen  02/09/2015 - normal colonoscopy.    Past Medical History:  Diagnosis Date   Adenomatous colon polyp    tubular   CHICKENPOX, HX OF    Fibromyalgia    OSTEOPENIA    SCIATICA, LEFT 08/09/2009   s/p PT, NSAIDs   Unspecified vitamin D deficiency      Past Surgical History:  Procedure Laterality Date   Arthroscopic shoulder  2008   CERVICAL FUSION  2006   CESAREAN SECTION  88 & 93   x's 2   TONSILLECTOMY  1965   Family History  Problem Relation Age of Onset   Memory loss Mother    Hypertension Mother    Hyperlipidemia Mother    Arthritis Other        grandparent   Breast cancer Maternal Grandmother    Seizures Son    Social History   Tobacco Use   Smoking status: Never   Smokeless tobacco: Never  Substance Use Topics   Alcohol use: No    Alcohol/week: 0.0 standard drinks of alcohol   Drug use: No   Current Outpatient Medications  Medication Sig Dispense Refill   amitriptyline (ELAVIL) 10 MG tablet Take 1 tablet (10 mg total) by mouth at bedtime. 30 tablet 2   Cholecalciferol (VITAMIN D) 2000 UNITS CAPS Take by mouth.     fluticasone (FLONASE) 50 MCG/ACT nasal spray  Place 2 sprays into both nostrils daily. 16 g 6   gabapentin (NEURONTIN) 300 MG capsule Take 1 capsule (300 mg total) by mouth at bedtime. 90 capsule 3   hydrocortisone (ANUSOL-HC) 25 MG suppository Place 1 suppository (25 mg total) rectally 2 (two) times daily. Use as needed for hemorrhoid pain 24 suppository 0   hydrocortisone 2.5 % cream Apply topically 2 (two) times daily. 30 g 0   levocetirizine (XYZAL) 5 MG tablet Take 1 tablet (5 mg total) by mouth every evening. 30 tablet 2   methylcellulose (CITRUCEL) oral powder Take 1 packet by mouth daily.     Rectal Protectant-Emollient (CALMOL-4) 76-10 % SUPP Use as directed once to twice daily 8 suppository 0   No current facility-administered medications for this visit.   Allergies  Allergen Reactions   Sulfonamide Derivatives     REACTION:  unspecified; as child     Review of Systems: All systems reviewed and negative except where noted in HPI.   Lab Results  Component Value Date   WBC 8.1 12/19/2021   HGB 12.7 12/19/2021   HCT 38.6 12/19/2021   MCV 90.3 12/19/2021   PLT 205.0 12/19/2021    Lab Results  Component Value Date   CREATININE 1.02 12/19/2021   BUN 18 12/19/2021   NA 141 12/19/2021   K 4.0 12/19/2021   CL 104 12/19/2021   CO2 30 12/19/2021    Lab Results  Component Value Date   ALT 24 12/19/2021   AST 22 12/19/2021   ALKPHOS 82 12/19/2021   BILITOT 0.4 12/19/2021     Physical Exam: BP 130/80 (BP Location: Left Arm, Patient Position: Sitting, Cuff Size: Large)   Pulse 65   Ht '5\' 3"'$  (1.6 m)   Wt 195 lb (88.5 kg)   SpO2 97%   BMI 34.54 kg/m  Constitutional: Pleasant,well-developed, female in no acute distress. HEENT: Normocephalic and atraumatic. Conjunctivae are normal. No scleral icterus. Neck supple.  Cardiovascular: Normal rate, regular rhythm.  Pulmonary/chest: Effort normal and breath sounds normal. No wheezing, rales or rhonchi. Abdominal: Soft, nondistended, nontender. Bowel sounds active throughout. There are no masses palpable. No hepatomegaly. DRE - healing thrombosed hemorrhoids peranal area, no mass lesions internally Extremities: no edema Lymphadenopathy: No cervical adenopathy noted. Neurological: Alert and oriented to person place and time. Skin: Skin is warm and dry. No rashes noted. Psychiatric: Normal mood and affect. Behavior is normal.   ASSESSMENT: 66 y.o. female here for assessment of the following  1. Hemorrhoids, unspecified hemorrhoid type   2. Altered bowel habits    Some altered bowel habits as outlined above, sense of incomplete evacuation with increased stool frequency.  During this time she is developed what appears to be a thrombosed hemorrhoid.  It does appear to be resolving / slowly improving with Anusol although however has not resolved yet.   Hopefully with more time this will continue to get better.  Recommend using sitz bath's daily.  Recommend completing the course of Anusol, we will give her some samples of Calmol 4 suppositories and she can use this as needed as well.  For her bowel habits recommend using Citrucel once daily to keep stools soft and prevent straining.  Given her changes in bowel habits in general in recent months, I offered her a colonoscopy to ensure no interval changes or development of polypoid lesions.  Following discussion of risk and benefits of colonoscopy and anesthesia she wants to proceed.  Currently scheduled in January, if openings sooner we  will hope to do it sooner, we will add her to the wait list for a sooner procedure.  I asked her to touch base with me in 1 week and let me know how she is doing in regards to the hemorrhoid.  If no benefit or worsening, she will let me know.  PLAN: - colonoscopy at [facility]  - Sitz baths daily - continue anusol until done - samples of Calmol4 and use PRN OTC - start Citrucel daily OTC, keep stools soft - call me in 1 week if no improvement  Jolly Mango, MD [facility]  CC: Copland, Gay Filler, MD

## 2022-03-27 NOTE — Patient Instructions (Addendum)
If you are age 64 or older, your body mass index should be between 23-30. Your Body mass index is 34.54 kg/m. If this is out of the aforementioned range listed, please consider follow up with your Primary Care Provider.  If you are age 38 or younger, your body mass index should be between 19-25. Your Body mass index is 34.54 kg/m. If this is out of the aformentioned range listed, please consider follow up with your Primary Care Provider.   ________________________________________________________  Kathy King have been scheduled for a colonoscopy. Please follow written instructions given to you at your visit today.  Please pick up your prep supplies at the pharmacy within the next 1-3 days. If you use inhalers (even only as needed), please bring them with you on the day of your procedure.  You can do Sitz Baths for rectal discomfort. We are giving you a handout today.  Please continue Anusol until done  We have given you samples of the following medication to take: Calmol 4 suppositories  Please purchase the following medications over the counter and take as directed: Citrucel: Use as directed once daily  Please call in 1 week if no improvement.  Thank you for entrusting me with your care and for choosing [facility], Dr. Dooly Cellar

## 2022-03-31 ENCOUNTER — Other Ambulatory Visit (HOSPITAL_BASED_OUTPATIENT_CLINIC_OR_DEPARTMENT_OTHER): Payer: Self-pay

## 2022-03-31 ENCOUNTER — Other Ambulatory Visit: Payer: Self-pay | Admitting: Family Medicine

## 2022-03-31 DIAGNOSIS — K644 Residual hemorrhoidal skin tags: Secondary | ICD-10-CM

## 2022-03-31 MED ORDER — HYDROCORTISONE ACETATE 25 MG RE SUPP
25.0000 mg | Freq: Two times a day (BID) | RECTAL | 1 refills | Status: DC
  Filled 2022-03-31: qty 24, 12d supply, fill #0

## 2022-04-01 NOTE — Telephone Encounter (Signed)
Inbound call from patient giving a f/u call regards to a Hemorid. States the Hemorid is not getting any better. Please advise.

## 2022-04-01 NOTE — Telephone Encounter (Signed)
Returned call to patient. She called with an update, she states that she knows it has not been a full week yet. She has followed the recommendations provided at her last office visit. She states that her bowel movements are better and she has not been straining to have a BM. Pt denies any rectal bleeding. She states that she is not in any pain, the hemorrhoid is there and it is just bothersome. She states that she has been using wipes to clean herself, sitz baths BID, she has 1 Calmol4 suppository left and just refilled her Anusol RX last night. Pt is still taking Citrucel. Pt would like to know how to proceed. Should she give it more time or come back in for a visit?

## 2022-04-01 NOTE — Telephone Encounter (Signed)
I think with more time she will likely be fine and this should resolve, however she is concerned about it, I can see her at a 8:10 on Friday morning of this week.  I am out of the office all week next week because I am covering the hospital, and in the week after I am pretty booked up.  Can you add her on Friday at 810 if she wants to proceed with reevaluation if symptoms persist?  Thanks

## 2022-04-02 NOTE — Telephone Encounter (Signed)
No appt available on Friday at this time. Called and spoke with patient regarding recommendations. Patient states that she will continue with recommendations as outlined at recent office visit. Pt will send an update via MyChart or call if she continues to have concerns and at that time we can offer her an appt with available APP or Dr. Havery Moros. Pt verbalized understanding and had no concerns at the end of the call.

## 2022-04-09 ENCOUNTER — Other Ambulatory Visit (HOSPITAL_BASED_OUTPATIENT_CLINIC_OR_DEPARTMENT_OTHER): Payer: Self-pay

## 2022-04-10 ENCOUNTER — Ambulatory Visit: Payer: No Typology Code available for payment source

## 2022-04-17 ENCOUNTER — Other Ambulatory Visit: Payer: Self-pay | Admitting: Family Medicine

## 2022-04-17 DIAGNOSIS — E2839 Other primary ovarian failure: Secondary | ICD-10-CM

## 2022-04-30 ENCOUNTER — Ambulatory Visit: Payer: 59 | Admitting: Family Medicine

## 2022-04-30 ENCOUNTER — Ambulatory Visit (INDEPENDENT_AMBULATORY_CARE_PROVIDER_SITE_OTHER): Payer: 59 | Admitting: Family Medicine

## 2022-04-30 ENCOUNTER — Encounter: Payer: Self-pay | Admitting: Family Medicine

## 2022-04-30 VITALS — BP 130/70 | HR 63 | Temp 98.0°F | Resp 18 | Ht 62.0 in | Wt 188.0 lb

## 2022-04-30 DIAGNOSIS — H6993 Unspecified Eustachian tube disorder, bilateral: Secondary | ICD-10-CM

## 2022-04-30 MED ORDER — METHYLPREDNISOLONE ACETATE 40 MG/ML IJ SUSP
80.0000 mg | Freq: Once | INTRAMUSCULAR | Status: AC
  Administered 2022-04-30: 80 mg via INTRAMUSCULAR

## 2022-04-30 NOTE — Progress Notes (Signed)
Chief Complaint  Patient presents with   Ear Fullness    Pt is here for bilateral ear pain, worse on the R. Duration: Several monthsbut recently worsening Progression: worse Associated symptoms: Popping and pressure in the ear Denies: sore throat, congestion, post nasal drip, coryza, and sneezing, bleeding, or discharge from ear Treatment to date: Flonase off-and-on over the past 2 months  Past Medical History:  Diagnosis Date   Adenomatous colon polyp    tubular   CHICKENPOX, HX OF    Fibromyalgia    OSTEOPENIA    SCIATICA, LEFT 08/09/2009   s/p PT, NSAIDs   Unspecified vitamin D deficiency     BP 130/70   Pulse 63   Temp 98 F (36.7 C)   Resp 18   Ht '5\' 2"'$  (1.575 m)   Wt 188 lb (85.3 kg)   SpO2 99%   BMI 34.39 kg/m  General: Awake, alert, appearing stated age HEENT:  L ear- Canal patent without drainage or erythema, TM is retracted, no fluid or erythema R ear- canal patent without drainage or erythema, TM is more severely retracted compared to the left, no erythema, no fluid Nose- nares patent and without discharge Lungs: Normal effort, no accessory muscle use Psych: Age appropriate judgment and insight, normal mood and affect  Dysfunction of both eustachian tubes - Plan: methylPREDNISolone acetate (DEPO-MEDROL) injection 80 mg  Cont INCS routinely. Depo injection today for more immediate relief.  F/u prn.  Pt voiced understanding and agreement to the plan.  [place], DO 04/30/22 9:55 AM

## 2022-05-01 ENCOUNTER — Encounter: Payer: Self-pay | Admitting: Neurology

## 2022-05-01 ENCOUNTER — Ambulatory Visit: Payer: 59 | Admitting: Neurology

## 2022-05-01 VITALS — BP 123/76 | HR 70 | Ht 62.0 in | Wt 192.5 lb

## 2022-05-01 DIAGNOSIS — G4733 Obstructive sleep apnea (adult) (pediatric): Secondary | ICD-10-CM | POA: Insufficient documentation

## 2022-05-01 NOTE — Progress Notes (Signed)
Patient: Kathy King Date of Birth: Oct 21, 1955  Reason for Visit: Follow up for CPAP History from: Patient Primary Neurologist: [name]   ASSESSMENT AND PLAN 67 y.o. year old female   1.  Mild OSA on CPAP -Commended on superb compliance, has seen clear benefit with CPAP use with improved restorative sleep, daytime energy -Continue nightly use, will send an order to her DME for continued supplies -Follow-up in 1 year or sooner if needed virtually  HISTORY OF PRESENT ILLNESS: Today 05/01/22 Here today for initial CPAP visit.  Had in lab sleep study 12/31/21.  Presenting symptoms were insomnia and nonrestorative sleep. Setup date was 02/13/22. Is relaxing better, feeling better, not waking up as much at night Using nasal mask. Works for Sales promotion account executive, is an Therapist, sports. Feels she's sleep better without a doubt. ESS 6.  Please see attached compliance report in summary data 03/31/22-04/29/22 shows 100% compliance greater than 4 hours.  AHI 0.5.  Leak is 0.6.  IMPRESSION:  1) Sleep disordered breathing was present. Obstructive Sleep  apnea was seen at mild degree and strongly supine and REM sleep  dependent. snoring was present and hypoxia was not clinically  relevant.     2) Periodic limb movements were not seen.  3) Total sleep time was within normal limits at 403.0 minutes.   Sleep efficiency was decreased at 81.0%.   The overall findings  were that of good sleep architecture, treatment for mild sleep  apnea is generally optional.   RECOMMENDATIONS: auto CPAP ResMed device with a setting to treat  mainly mild OSA and snoring, 5-12 cm water, 1 cm EPR and  interface of patient's comfort. can be nasal pillow with  chinstrap if she likes it.  1) snoring and mild, but strongly REM sleep dependent OSA would  best be treated with a PAP therapy device. I like for the patient  to consider this- 90 days should allow symptom impact under PAP  therapy to be notable if mild apnea is the cause  of fatigue.   HISTORY 11/07/21 Dr. 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 Last consultation in [phone].  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 She is worried by increasing insomnia- she falls asleep well, but wakes up easily, a bathroom break can cause a delayed re-initiation of sleep. She doesn't snore loudly, more of an audible breathing.  " When I get home from work and I eat dinner and  go to bed-  I have no strength left to even walk the dog. I take a hot bath or shower, and can go to sleep easily".  She is a water drinker, rarely tea, one cup of coffee in AM.  Never been a drinker or smoker. She has not worked shifts.    REVIEW OF SYSTEMS: Out of a complete 14 system review of symptoms, the patient complains only of the following symptoms, and all other reviewed systems are negative.  See HPI  ALLERGIES: Allergies  Allergen Reactions   Sulfonamide Derivatives     REACTION: unspecified; as child    HOME MEDICATIONS: Outpatient Medications Prior to Visit  Medication Sig Dispense Refill   amitriptyline (ELAVIL) 10 MG tablet Take 1 tablet (10 mg total) by mouth at bedtime. 30 tablet 2  Cholecalciferol (VITAMIN D) 2000 UNITS CAPS Take by mouth.     fluticasone (FLONASE) 50 MCG/ACT nasal spray Place 2 sprays into both nostrils daily. 16 g 6   gabapentin (NEURONTIN) 300 MG capsule Take 1 capsule (300 mg total) by mouth at bedtime. 90 capsule 3   hydrocortisone (ANUSOL-HC) 25 MG suppository Place 1 suppository (25 mg total) rectally 2 (two) times daily. Use as needed for hemorrhoid pain 24 suppository 1   hydrocortisone 2.5 % cream Apply topically 2 (two) times daily. 30 g 0   levocetirizine (XYZAL) 5 MG tablet Take 1 tablet (5 mg total) by mouth every evening. 30 tablet 2   methylcellulose (CITRUCEL) oral  powder Take 1 packet by mouth daily.     Rectal Protectant-Emollient (CALMOL-4) 76-10 % SUPP Use as directed once to twice daily 8 suppository 0   No facility-administered medications prior to visit.    PAST MEDICAL HISTORY: Past Medical History:  Diagnosis Date   Adenomatous colon polyp    tubular   CHICKENPOX, HX OF    Fibromyalgia    OSTEOPENIA    SCIATICA, LEFT 08/09/2009   s/p PT, NSAIDs   Unspecified vitamin D deficiency     PAST SURGICAL HISTORY: Past Surgical History:  Procedure Laterality Date   Arthroscopic shoulder  2008   CERVICAL FUSION  2006   CESAREAN SECTION  88 & 93   x's 2   TONSILLECTOMY  1965    FAMILY HISTORY: Family History  Problem Relation Age of Onset   Memory loss Mother    Hypertension Mother    Hyperlipidemia Mother    Arthritis Other        grandparent   Breast cancer Maternal Grandmother    Seizures Son     SOCIAL HISTORY: Social History   Socioeconomic History   Marital status: Married    Spouse name: Not on file   Number of children: Not on file   Years of education: Not on file   Highest education level: Not on file  Occupational History   Not on file  Tobacco Use   Smoking status: Never   Smokeless tobacco: Never  Substance and Sexual Activity   Alcohol use: No    Alcohol/week: 0.0 standard drinks of alcohol   Drug use: No   Sexual activity: Not on file  Other Topics Concern   Not on file  Social History Narrative   Married, lives with spouse and kids   Social Determinants of Health   Financial Resource Strain: Not on file  Food Insecurity: Not on file  Transportation Needs: Not on file  Physical Activity: Not on file  Stress: Not on file  Social Connections: Not on file  Intimate Partner Violence: Not on file   PHYSICAL EXAM  Vitals:   05/01/22 1243  BP: 123/76  Pulse: 70  Weight: 192 lb 8 oz (87.3 kg)  Height: '5\' 2"'$  (1.575 m)   Body mass index is 35.21 kg/m.  Generalized: Well developed, in no  acute distress  Neurological examination  Mentation: Alert oriented to time, place, history taking. Follows all commands speech and language fluent Cranial nerve II-XII: Pupils were equal round reactive to light. Extraocular movements were full, visual field were full on confrontational test. Facial sensation and strength were normal. Head turning and shoulder shrug  were normal and symmetric. Motor: The motor testing reveals 5 over 5 strength of all 4 extremities. Good symmetric motor tone is noted throughout.  Sensory: Sensory testing is intact to  soft touch on all 4 extremities. No evidence of extinction is noted.  Coordination: Cerebellar testing reveals good finger-nose-finger and heel-to-shin bilaterally.  Gait and station: Gait is normal.   DIAGNOSTIC DATA (LABS, IMAGING, TESTING) - I reviewed patient records, labs, notes, testing and imaging myself where available.  Lab Results  Component Value Date   WBC 8.1 12/19/2021   HGB 12.7 12/19/2021   HCT 38.6 12/19/2021   MCV 90.3 12/19/2021   PLT 205.0 12/19/2021      Component Value Date/Time   NA 141 12/19/2021 1013   K 4.0 12/19/2021 1013   CL 104 12/19/2021 1013   CO2 30 12/19/2021 1013   GLUCOSE 67 (L) 12/19/2021 1013   BUN 18 12/19/2021 1013   CREATININE 1.02 12/19/2021 1013   CALCIUM 9.3 12/19/2021 1013   PROT 6.1 12/19/2021 1013   ALBUMIN 4.2 12/19/2021 1013   AST 22 12/19/2021 1013   ALT 24 12/19/2021 1013   ALKPHOS 82 12/19/2021 1013   BILITOT 0.4 12/19/2021 1013   GFRNONAA >60 05/06/2016 1620   GFRAA >60 05/06/2016 1620   Lab Results  Component Value Date   CHOL 219 (H) 12/19/2021   HDL 74.20 12/19/2021   LDLCALC 128 (H) 12/19/2021   TRIG 81.0 12/19/2021   CHOLHDL 3 12/19/2021   Lab Results  Component Value Date   HGBA1C 5.7 12/19/2021   Lab Results  Component Value Date   [id number] 974 12/13/2014   Lab Results  Component Value Date   TSH 1.93 12/19/2021    Butler Denmark, AGNP-C, DNP 05/01/2022,  1:12 PM [facility] [address], [place], [place] [address] [phone]

## 2022-05-02 ENCOUNTER — Other Ambulatory Visit: Payer: Self-pay | Admitting: Family Medicine

## 2022-05-02 ENCOUNTER — Other Ambulatory Visit (HOSPITAL_BASED_OUTPATIENT_CLINIC_OR_DEPARTMENT_OTHER): Payer: Self-pay

## 2022-05-02 MED ORDER — HYDROCOD POLI-CHLORPHE POLI ER 10-8 MG/5ML PO SUER
5.0000 mL | Freq: Two times a day (BID) | ORAL | 0 refills | Status: DC | PRN
  Filled 2022-05-02: qty 115, 12d supply, fill #0

## 2022-05-06 ENCOUNTER — Telehealth: Payer: Self-pay

## 2022-05-06 NOTE — Telephone Encounter (Signed)
Community message sent for CPAP supplies

## 2022-05-07 ENCOUNTER — Other Ambulatory Visit (HOSPITAL_BASED_OUTPATIENT_CLINIC_OR_DEPARTMENT_OTHER): Payer: Self-pay

## 2022-05-08 ENCOUNTER — Ambulatory Visit: Payer: No Typology Code available for payment source | Admitting: Family Medicine

## 2022-05-12 ENCOUNTER — Other Ambulatory Visit (HOSPITAL_BASED_OUTPATIENT_CLINIC_OR_DEPARTMENT_OTHER): Payer: Self-pay

## 2022-05-12 ENCOUNTER — Encounter: Payer: Self-pay | Admitting: Family Medicine

## 2022-05-12 DIAGNOSIS — M5416 Radiculopathy, lumbar region: Secondary | ICD-10-CM

## 2022-05-12 MED ORDER — GABAPENTIN 300 MG PO CAPS
300.0000 mg | ORAL_CAPSULE | Freq: Every day | ORAL | 3 refills | Status: DC
  Filled 2022-05-12: qty 90, 90d supply, fill #0
  Filled 2022-07-21 – 2022-07-25 (×3): qty 90, 90d supply, fill #1
  Filled 2022-10-24: qty 90, 90d supply, fill #2

## 2022-05-13 ENCOUNTER — Other Ambulatory Visit (HOSPITAL_BASED_OUTPATIENT_CLINIC_OR_DEPARTMENT_OTHER): Payer: Self-pay

## 2022-05-14 ENCOUNTER — Encounter: Payer: Self-pay | Admitting: Gastroenterology

## 2022-05-14 ENCOUNTER — Other Ambulatory Visit (HOSPITAL_BASED_OUTPATIENT_CLINIC_OR_DEPARTMENT_OTHER): Payer: Self-pay

## 2022-05-15 DIAGNOSIS — G4733 Obstructive sleep apnea (adult) (pediatric): Secondary | ICD-10-CM | POA: Diagnosis not present

## 2022-05-16 DIAGNOSIS — G4733 Obstructive sleep apnea (adult) (pediatric): Secondary | ICD-10-CM | POA: Diagnosis not present

## 2022-05-21 ENCOUNTER — Ambulatory Visit: Payer: 59 | Admitting: Gastroenterology

## 2022-05-21 ENCOUNTER — Encounter: Payer: Self-pay | Admitting: Gastroenterology

## 2022-05-21 VITALS — BP 128/67 | HR 69 | Temp 97.3°F | Resp 13 | Ht 63.0 in | Wt 195.0 lb

## 2022-05-21 DIAGNOSIS — R194 Change in bowel habit: Secondary | ICD-10-CM

## 2022-05-21 DIAGNOSIS — D127 Benign neoplasm of rectosigmoid junction: Secondary | ICD-10-CM

## 2022-05-21 DIAGNOSIS — K649 Unspecified hemorrhoids: Secondary | ICD-10-CM | POA: Diagnosis not present

## 2022-05-21 MED ORDER — SODIUM CHLORIDE 0.9 % IV SOLN: 500.0000 mL | Freq: Once | INTRAVENOUS | Status: DC

## 2022-05-21 NOTE — Progress Notes (Signed)
[facility] History and Physical   Primary Care Physician:  Darreld Mclean, MD   Reason for Procedure:   Change in bowel habits  Plan:    colonoscopy     HPI: Kathy King is a 67 y.o. female  here for colonoscopy to evaluate altered bowel habits - sense of incomplete evacuation, urgency, causing issues with hemorrhoids. Last exam 01/2015 normal. Feeling somewhat better on Citrucel since last exam.  . No family history of colon cancer known. Otherwise feels well without any cardiopulmonary symptoms.   I have discussed risks / benefits of anesthesia and endoscopic procedure with [name] and they wish to proceed with the exams as outlined today.    Past Medical History:  Diagnosis Date   Adenomatous colon polyp    tubular   CHICKENPOX, HX OF    Fibromyalgia    OSTEOPENIA    SCIATICA, LEFT 08/09/2009   s/p PT, NSAIDs   Unspecified vitamin D deficiency     Past Surgical History:  Procedure Laterality Date   Arthroscopic shoulder  2008   CERVICAL FUSION  2006   CESAREAN SECTION  88 & 93   x's 2   TONSILLECTOMY  1965    Prior to Admission medications   Medication Sig Start Date End Date Taking? Authorizing Provider  amitriptyline (ELAVIL) 10 MG tablet Take 1 tablet (10 mg total) by mouth at bedtime. 03/14/22  Yes Shelda Pal, DO  Cholecalciferol (VITAMIN D) 2000 UNITS CAPS Take by mouth.   Yes [provider]  fluticasone (FLONASE) 50 MCG/ACT nasal spray Place 2 sprays into both nostrils daily. 11/22/21  Yes Shelda Pal, DO  gabapentin (NEURONTIN) 300 MG capsule Take 1 capsule (300 mg total) by mouth at bedtime. 05/12/22  Yes Copland, Gay Filler, MD  levocetirizine (XYZAL) 5 MG tablet Take 1 tablet (5 mg total) by mouth every evening. Patient not taking: Reported on 05/21/2022 11/22/21   Shelda Pal, DO  methylcellulose (CITRUCEL) oral powder Take 1 packet by mouth daily. 03/27/22   Takara Sermons,  Carlota Raspberry, MD  Rectal Protectant-Emollient (CALMOL-4) 76-10 % SUPP Use as directed once to twice daily 03/27/22   Karenann Mcgrory, Carlota Raspberry, MD    Current Outpatient Medications  Medication Sig Dispense Refill   amitriptyline (ELAVIL) 10 MG tablet Take 1 tablet (10 mg total) by mouth at bedtime. 30 tablet 2   Cholecalciferol (VITAMIN D) 2000 UNITS CAPS Take by mouth.     fluticasone (FLONASE) 50 MCG/ACT nasal spray Place 2 sprays into both nostrils daily. 16 g 6   gabapentin (NEURONTIN) 300 MG capsule Take 1 capsule (300 mg total) by mouth at bedtime. 90 capsule 3   levocetirizine (XYZAL) 5 MG tablet Take 1 tablet (5 mg total) by mouth every evening. (Patient not taking: Reported on 05/21/2022) 30 tablet 2   methylcellulose (CITRUCEL) oral powder Take 1 packet by mouth daily.     Rectal Protectant-Emollient (CALMOL-4) 76-10 % SUPP Use as directed once to twice daily 8 suppository 0   Current Facility-Administered Medications  Medication Dose Route Frequency Provider Last Rate Last Admin   0.9 %  sodium chloride infusion  500 mL Intravenous Once Morgann Woodburn, Carlota Raspberry, MD        Allergies as of 05/21/2022 - Review Complete 05/21/2022  Allergen Reaction Noted   Sulfonamide derivatives  10/26/2009    Family History  Problem Relation Age of Onset   Memory loss Mother    Hypertension Mother    Hyperlipidemia  Mother    Breast cancer Maternal Grandmother    Seizures Son    Arthritis Other        grandparent   Colon cancer Neg Hx    Esophageal cancer Neg Hx    Rectal cancer Neg Hx    Stomach cancer Neg Hx     Social History   Socioeconomic History   Marital status: Married    Spouse name: Not on file   Number of children: Not on file   Years of education: Not on file   Highest education level: Not on file  Occupational History   Not on file  Tobacco Use   Smoking status: Never   Smokeless tobacco: Never  Vaping Use   Vaping Use: Never used  Substance and Sexual Activity    Alcohol use: No    Alcohol/week: 0.0 standard drinks of alcohol   Drug use: No   Sexual activity: Not on file  Other Topics Concern   Not on file  Social History Narrative   Married, lives with spouse and kids   Social Determinants of Health   Financial Resource Strain: Not on file  Food Insecurity: Not on file  Transportation Needs: Not on file  Physical Activity: Not on file  Stress: Not on file  Social Connections: Not on file  Intimate Partner Violence: Not on file    Review of Systems: All other review of systems negative except as mentioned in the HPI.  Physical Exam: Vital signs BP 120/72   Pulse 76   Temp (!) 97.3 F (36.3 C) (Skin)   Ht '5\' 3"'$  (1.6 m)   Wt 195 lb (88.5 kg)   SpO2 100%   BMI 34.54 kg/m   General:   Alert,  Well-developed, pleasant and cooperative in NAD Lungs:  Clear throughout to auscultation.   Heart:  Regular rate and rhythm Abdomen:  Soft, nontender and nondistended.   Neuro/Psych:  Alert and cooperative. Normal mood and affect. A and O x 3  Jolly Mango, MD [facility]

## 2022-05-21 NOTE — Op Note (Signed)
[place] Patient Name: Kathy King Procedure Date: 05/21/2022 10:20 AM MRN: [id number] Endoscopist: Remo Lipps P. Havery Moros , MD, [id number] Age: 67 Referring MD:  Date of Birth: Apr 24, 1956 Gender: Female Account #: [id number] Procedure:                Colonoscopy Indications:              Altered bowel habits - improved with Citrucel since                            I have last seen her, last colonoscopy 01/2015                            normal Medicines:                Monitored Anesthesia Care Procedure:                Pre-Anesthesia Assessment:                           - Prior to the procedure, a History and Physical                            was performed, and patient medications and                            allergies were reviewed. The patient's tolerance of                            previous anesthesia was also reviewed. The risks                            and benefits of the procedure and the sedation                            options and risks were discussed with the patient.                            All questions were answered, and informed consent                            was obtained. Prior Anticoagulants: The patient has                            taken no anticoagulant or antiplatelet agents. ASA                            Grade Assessment: II - A patient with mild systemic                            disease. After reviewing the risks and benefits,                            the patient was deemed in satisfactory condition to  undergo the procedure.                           After obtaining informed consent, the colonoscope                            was passed under direct vision. Throughout the                            procedure, the patient's blood pressure, pulse, and                            oxygen saturations were monitored continuously. The                            Olympus Scope [id number] was introduced through  the                            anus and advanced to the the terminal ileum, with                            identification of the appendiceal orifice and IC                            valve. The colonoscopy was performed without                            difficulty. The patient tolerated the procedure                            well. The quality of the bowel preparation was                            good. The terminal ileum, ileocecal valve,                            appendiceal orifice, and rectum were photographed. Scope In: 10:24:35 AM Scope Out: 10:43:02 AM Scope Withdrawal Time: 0 hours 14 minutes 38 seconds  Total Procedure Duration: 0 hours 18 minutes 27 seconds  Findings:                 The perianal and digital rectal examinations were                            normal.                           The terminal ileum appeared normal.                           A 3 mm polyp was found in the recto-sigmoid colon.                            The polyp was sessile. The polyp was removed with a  cold snare. Resection and retrieval were complete.                           Internal hemorrhoids were found during                            retroflexion. The hemorrhoids were small.                           The exam was otherwise without abnormality. Complications:            No immediate complications. Estimated blood loss:                            Minimal. Estimated Blood Loss:     Estimated blood loss was minimal. Impression:               - The examined portion of the ileum was normal.                           - One 3 mm polyp at the recto-sigmoid colon,                            removed with a cold snare. Resected and retrieved.                           - Internal hemorrhoids.                           - The examination was otherwise normal. Recommendation:           - Patient has a contact number available for                            emergencies. The  signs and symptoms of potential                            delayed complications were discussed with the                            patient. Return to normal activities tomorrow.                            Written discharge instructions were provided to the                            patient.                           - Resume previous diet.                           - Continue present medications.                           - Continue Citrucel once daily.                           -  Await pathology results. Remo Lipps P. Havery Moros, MD 05/21/2022 10:46:24 AM This report has been signed electronically.

## 2022-05-21 NOTE — Progress Notes (Signed)
Pt's states no medical or surgical changes since previsit or office visit. VS assessed by D.T

## 2022-05-21 NOTE — Progress Notes (Signed)
Called to room to assist during endoscopic procedure.  Patient ID and intended procedure confirmed with present staff. Received instructions for my participation in the procedure from the performing physician.

## 2022-05-21 NOTE — Patient Instructions (Signed)
Await pathology result.  Handout on polyps and hemorrhoids provided.  YOU HAD AN ENDOSCOPIC PROCEDURE TODAY AT [facility]:   Refer to the procedure report that was given to you for any specific questions about what was found during the examination.  If the procedure report does not answer your questions, please call your gastroenterologist to clarify.  If you requested that your care partner not be given the details of your procedure findings, then the procedure report has been included in a sealed envelope for you to review at your convenience later.  YOU SHOULD EXPECT: Some feelings of bloating in the abdomen. Passage of more gas than usual.  Walking can help get rid of the air that was put into your GI tract during the procedure and reduce the bloating. If you had a lower endoscopy (such as a colonoscopy or flexible sigmoidoscopy) you may notice spotting of blood in your stool or on the toilet paper. If you underwent a bowel prep for your procedure, you may not have a normal bowel movement for a few days.  Please Note:  You might notice some irritation and congestion in your nose or some drainage.  This is from the oxygen used during your procedure.  There is no need for concern and it should clear up in a day or so.  SYMPTOMS TO REPORT IMMEDIATELY:  Following lower endoscopy (colonoscopy or flexible sigmoidoscopy):  Excessive amounts of blood in the stool  Significant tenderness or worsening of abdominal pains  Swelling of the abdomen that is new, acute  Fever of 100F or higher  For urgent or emergent issues, a gastroenterologist can be reached at any hour by calling [phone]. Do not use MyChart messaging for urgent concerns.    DIET:  We do recommend a small meal at first, but then you may proceed to your regular diet.  Drink plenty of fluids but you should avoid alcoholic beverages for 24 hours.  ACTIVITY:  You should plan to take it easy for the rest of today  and you should NOT DRIVE or use heavy machinery until tomorrow (because of the sedation medicines used during the test).    FOLLOW UP: Our staff will call the number listed on your records the next business day following your procedure.  We will call around 7:15- 8:00 am to check on you and address any questions or concerns that you may have regarding the information given to you following your procedure. If we do not reach you, we will leave a message.     If any biopsies were taken you will be contacted by phone or by letter within the next 1-3 weeks.  Please call us at [phone] if you have not heard about the biopsies in 3 weeks.    SIGNATURES/CONFIDENTIALITY: You and/or your care partner have signed paperwork which will be entered into your electronic medical record.  These signatures attest to the fact that that the information above on your After Visit Summary has been reviewed and is understood.  Full responsibility of the confidentiality of this discharge information lies with you and/or your care-partner.

## 2022-05-22 ENCOUNTER — Ambulatory Visit
Admission: RE | Admit: 2022-05-22 | Discharge: 2022-05-22 | Disposition: A | Discharge status: Home / Self Care | Payer: Self-pay | Source: Ambulatory Visit | Attending: Family Medicine | Admitting: Family Medicine

## 2022-05-22 ENCOUNTER — Telehealth: Payer: Self-pay

## 2022-05-22 DIAGNOSIS — Z1231 Encounter for screening mammogram for malignant neoplasm of breast: Secondary | ICD-10-CM | POA: Diagnosis not present

## 2022-05-22 DIAGNOSIS — Z139 Encounter for screening, unspecified: Secondary | ICD-10-CM

## 2022-05-22 NOTE — Telephone Encounter (Signed)
  Follow up Call-     05/21/2022    9:37 AM  Call back number  Post procedure Call Back phone  # [phone]  Permission to leave phone message Yes     Patient questions:  Do you have a fever, pain , or abdominal swelling? No. Pain Score  0 *  Have you tolerated food without any problems? Yes.    Have you been able to return to your normal activities? Yes.    Do you have any questions about your discharge instructions: Diet   No. Medications  No. Follow up visit  No.  Do you have questions or concerns about your Care? No.  Actions: * If pain score is 4 or above: No action needed, pain <4.

## 2022-05-29 ENCOUNTER — Ambulatory Visit: Payer: Self-pay

## 2022-06-15 DIAGNOSIS — G4733 Obstructive sleep apnea (adult) (pediatric): Secondary | ICD-10-CM | POA: Diagnosis not present

## 2022-06-16 ENCOUNTER — Other Ambulatory Visit: Payer: Self-pay | Admitting: Family Medicine

## 2022-06-16 DIAGNOSIS — G4733 Obstructive sleep apnea (adult) (pediatric): Secondary | ICD-10-CM | POA: Diagnosis not present

## 2022-06-19 ENCOUNTER — Ambulatory Visit
Admission: RE | Admit: 2022-06-19 | Discharge: 2022-06-19 | Disposition: A | Discharge status: Home / Self Care | Payer: 59 | Source: Ambulatory Visit | Attending: Family Medicine | Admitting: Family Medicine

## 2022-06-19 DIAGNOSIS — Z780 Asymptomatic menopausal state: Secondary | ICD-10-CM | POA: Diagnosis not present

## 2022-06-19 DIAGNOSIS — M8589 Other specified disorders of bone density and structure, multiple sites: Secondary | ICD-10-CM | POA: Diagnosis not present

## 2022-06-19 DIAGNOSIS — E2839 Other primary ovarian failure: Secondary | ICD-10-CM

## 2022-06-23 ENCOUNTER — Other Ambulatory Visit (HOSPITAL_BASED_OUTPATIENT_CLINIC_OR_DEPARTMENT_OTHER): Payer: Self-pay

## 2022-06-23 ENCOUNTER — Other Ambulatory Visit: Payer: Self-pay | Admitting: Family Medicine

## 2022-06-23 DIAGNOSIS — M797 Fibromyalgia: Secondary | ICD-10-CM

## 2022-06-23 DIAGNOSIS — R195 Other fecal abnormalities: Secondary | ICD-10-CM

## 2022-06-23 MED ORDER — AMITRIPTYLINE HCL 10 MG PO TABS
10.0000 mg | ORAL_TABLET | Freq: Every day | ORAL | 2 refills | Status: DC
  Filled 2022-06-23: qty 30, 30d supply, fill #0
  Filled 2022-07-21: qty 30, 30d supply, fill #1
  Filled 2022-08-18: qty 30, 30d supply, fill #2

## 2022-06-27 ENCOUNTER — Encounter: Payer: Self-pay | Admitting: Family Medicine

## 2022-07-14 DIAGNOSIS — G4733 Obstructive sleep apnea (adult) (pediatric): Secondary | ICD-10-CM | POA: Diagnosis not present

## 2022-07-15 DIAGNOSIS — G4733 Obstructive sleep apnea (adult) (pediatric): Secondary | ICD-10-CM | POA: Diagnosis not present

## 2022-07-21 ENCOUNTER — Other Ambulatory Visit (HOSPITAL_BASED_OUTPATIENT_CLINIC_OR_DEPARTMENT_OTHER): Payer: Self-pay

## 2022-07-22 ENCOUNTER — Other Ambulatory Visit (HOSPITAL_BASED_OUTPATIENT_CLINIC_OR_DEPARTMENT_OTHER): Payer: Self-pay

## 2022-07-22 ENCOUNTER — Encounter: Payer: Self-pay | Admitting: Family Medicine

## 2022-07-22 ENCOUNTER — Ambulatory Visit (INDEPENDENT_AMBULATORY_CARE_PROVIDER_SITE_OTHER): Payer: 59 | Admitting: Family Medicine

## 2022-07-22 VITALS — BP 122/86 | HR 83 | Temp 98.0°F | Resp 12 | Ht 63.0 in | Wt 195.6 lb

## 2022-07-22 DIAGNOSIS — N3001 Acute cystitis with hematuria: Secondary | ICD-10-CM

## 2022-07-22 DIAGNOSIS — R300 Dysuria: Secondary | ICD-10-CM | POA: Diagnosis not present

## 2022-07-22 LAB — POCT URINALYSIS DIP (MANUAL ENTRY)
Bilirubin, UA: NEGATIVE
Glucose, UA: NEGATIVE mg/dL
Ketones, POC UA: NEGATIVE mg/dL
Leukocytes, UA: NEGATIVE
Nitrite, UA: NEGATIVE
Protein Ur, POC: NEGATIVE mg/dL
Spec Grav, UA: 1.02 (ref 1.010–1.025)
Urobilinogen, UA: 0.2 E.U./dL
pH, UA: 5 (ref 5.0–8.0)

## 2022-07-22 MED ORDER — NITROFURANTOIN MONOHYD MACRO 100 MG PO CAPS
100.0000 mg | ORAL_CAPSULE | Freq: Two times a day (BID) | ORAL | 0 refills | Status: AC
  Filled 2022-07-22: qty 10, 5d supply, fill #0

## 2022-07-22 MED ORDER — FLUCONAZOLE 150 MG PO TABS
ORAL_TABLET | ORAL | 0 refills | Status: DC
  Filled 2022-07-22: qty 2, 3d supply, fill #0

## 2022-07-22 NOTE — Progress Notes (Signed)
CC: Dysuria  Kathy King is a 67 y.o. female here for possible UTI.  Duration: 2 days. Symptoms: Dysuria, urinary frequency, urinary retention, and urgency Denies: hematuria, urinary hesitancy, fever, nausea, vomiting,  flank pain, vaginal discharge Hx of recurrent UTI? No Denies new sexual partners.  Past Medical History:  Diagnosis Date   Adenomatous colon polyp    tubular   CHICKENPOX, HX OF    Fibromyalgia    OSTEOPENIA    SCIATICA, LEFT 08/09/2009   s/p PT, NSAIDs   Unspecified vitamin D deficiency      BP 122/86   Pulse 83   Temp 98 F (36.7 C) (Oral)   Resp 12   Ht 5\' 3"  (1.6 m)   Wt 195 lb 9.6 oz (88.7 kg)   SpO2 95%   BMI 34.65 kg/m  General: Awake, alert, appears stated age Heart: RRR Lungs: CTAB, normal respiratory effort, no accessory muscle usage Abd: BS+, soft, NT, ND, no masses or organomegaly MSK: No CVA tenderness, neg Lloyd's sign Psych: Age appropriate judgment and insight  Acute cystitis with hematuria - Plan: nitrofurantoin, macrocrystal-monohydrate, (MACROBID) 100 MG capsule, fluconazole (DIFLUCAN) 150 MG tablet  Dysuria - Plan: POCT urinalysis dipstick, Urine Culture  5 d of Macrobid, Dilfucan prn. Stay hydrated. Seek immediate care if pt starts to develop fevers, new/worsening symptoms, uncontrollable N/V. F/u prn. The patient voiced understanding and agreement to the plan.  [place], DO 07/22/22 9:58 AM

## 2022-07-23 ENCOUNTER — Other Ambulatory Visit (HOSPITAL_BASED_OUTPATIENT_CLINIC_OR_DEPARTMENT_OTHER): Payer: Self-pay

## 2022-07-23 LAB — URINE CULTURE
MICRO NUMBER:: 14742839
SPECIMEN QUALITY:: ADEQUATE

## 2022-07-25 ENCOUNTER — Other Ambulatory Visit: Payer: Self-pay

## 2022-07-25 ENCOUNTER — Other Ambulatory Visit (HOSPITAL_BASED_OUTPATIENT_CLINIC_OR_DEPARTMENT_OTHER): Payer: Self-pay

## 2022-08-06 ENCOUNTER — Other Ambulatory Visit (INDEPENDENT_AMBULATORY_CARE_PROVIDER_SITE_OTHER): Payer: 59

## 2022-08-06 ENCOUNTER — Other Ambulatory Visit: Payer: Self-pay | Admitting: Family Medicine

## 2022-08-06 ENCOUNTER — Encounter: Payer: Self-pay | Admitting: Family Medicine

## 2022-08-06 DIAGNOSIS — R739 Hyperglycemia, unspecified: Secondary | ICD-10-CM

## 2022-08-06 DIAGNOSIS — R944 Abnormal results of kidney function studies: Secondary | ICD-10-CM | POA: Diagnosis not present

## 2022-08-06 LAB — BASIC METABOLIC PANEL
BUN: 19 mg/dL (ref 6–23)
CO2: 30 mEq/L (ref 19–32)
Calcium: 9.4 mg/dL (ref 8.4–10.5)
Chloride: 103 mEq/L (ref 96–112)
Creatinine, Ser: 1.02 mg/dL (ref 0.40–1.20)
GFR: 58.22 mL/min — ABNORMAL LOW (ref >60.00)
Glucose, Bld: 78 mg/dL (ref 70–99)
Potassium: 3.9 mEq/L (ref 3.5–5.1)
Sodium: 141 mEq/L (ref 135–145)

## 2022-08-06 LAB — HEMOGLOBIN A1C: Hgb A1c MFr Bld: 5.5 % (ref 4.6–6.5)

## 2022-08-06 NOTE — Progress Notes (Signed)
Pt had labs from Dr Carmelia Roller and Dr Patsy Lager on same day.  See charges in lab encounter for Dr Copland at 10:15.

## 2022-08-13 ENCOUNTER — Ambulatory Visit (INDEPENDENT_AMBULATORY_CARE_PROVIDER_SITE_OTHER): Payer: 59 | Admitting: Family Medicine

## 2022-08-13 ENCOUNTER — Encounter: Payer: Self-pay | Admitting: Family Medicine

## 2022-08-13 VITALS — BP 108/70 | HR 58 | Temp 98.2°F | Resp 18 | Ht 63.0 in | Wt 195.0 lb

## 2022-08-13 DIAGNOSIS — M549 Dorsalgia, unspecified: Secondary | ICD-10-CM

## 2022-08-13 MED ORDER — METHYLPREDNISOLONE ACETATE 80 MG/ML IJ SUSP
80.0000 mg | Freq: Once | INTRAMUSCULAR | Status: AC
Start: 2022-08-13 — End: 2022-08-13
  Administered 2022-08-13: 80 mg via INTRAMUSCULAR

## 2022-08-13 NOTE — Progress Notes (Signed)
Musculoskeletal Exam  Patient: Kathy King DOB: [birth year]  DOS: 08/13/2022  SUBJECTIVE:  Chief Complaint:   Chief Complaint  Patient presents with   Back Pain    Kathy King is a 67 y.o.  female for evaluation and treatment of low back pain.   Onset:  3 days ago. No obvious inj or trigger.  She was working outside in the yard over the weekend. Location: Low back and left buttock region Character:  sharp  Progression of issue:  is unchanged Associated symptoms: No bruising, redness, swelling Treatment: to date has been rest and massage.   Neurovascular symptoms: no  Past Medical History:  Diagnosis Date   Adenomatous colon polyp    tubular   CHICKENPOX, HX OF    Fibromyalgia    OSTEOPENIA    SCIATICA, LEFT 08/09/2009   s/p PT, NSAIDs   Unspecified vitamin D deficiency     Objective: VITAL SIGNS: BP 108/70   Pulse (!) 58   Temp 98.2 F (36.8 C)   Resp 18   Ht  (1.6 m)   Wt 195 lb (88.5 kg)   SpO2 98%   BMI 34.54 kg/m  Constitutional: Well formed, well developed. No acute distress. Thorax & Lungs: No accessory muscle use Musculoskeletal: Low back.   Tenderness to palpation: Yes over lumbar paraspinal musculature bilaterally, SI joint bilaterally, greater trochanter bilaterally, lateral gluteal region bilaterally but worse on the left Deformity: no Ecchymosis: no Tests positive: none Tests negative: Straight leg Neurologic: Normal sensory function. No focal deficits noted. DTR's equal and symmetric in LE's. No clonus.  Gait is normal. Psychiatric: Normal mood. Age appropriate judgment and insight. Alert & oriented x 3.    Assessment:  Back pain, unspecified back location, unspecified back pain laterality, unspecified chronicity - Plan: methylPREDNISolone acetate (DEPO-MEDROL) injection 80 mg  Plan: Stretches/exercises, heat, ice, Tylenol.  IM Depo today. F/u prn. The patient voiced understanding and agreement to the  plan.   Jilda Roche [place], DO 08/13/22  9:59 AM

## 2022-08-13 NOTE — Patient Instructions (Signed)

## 2022-08-15 DIAGNOSIS — G4733 Obstructive sleep apnea (adult) (pediatric): Secondary | ICD-10-CM | POA: Diagnosis not present

## 2022-09-12 ENCOUNTER — Encounter: Payer: Self-pay | Admitting: Family Medicine

## 2022-09-12 ENCOUNTER — Ambulatory Visit (INDEPENDENT_AMBULATORY_CARE_PROVIDER_SITE_OTHER): Payer: 59 | Admitting: Family Medicine

## 2022-09-12 ENCOUNTER — Other Ambulatory Visit: Payer: Self-pay | Admitting: Family Medicine

## 2022-09-12 ENCOUNTER — Other Ambulatory Visit (HOSPITAL_BASED_OUTPATIENT_CLINIC_OR_DEPARTMENT_OTHER): Payer: Self-pay

## 2022-09-12 VITALS — BP 128/76 | HR 76 | Temp 98.2°F | Resp 18 | Ht 63.0 in | Wt 192.0 lb

## 2022-09-12 DIAGNOSIS — M545 Low back pain, unspecified: Secondary | ICD-10-CM | POA: Diagnosis not present

## 2022-09-12 MED ORDER — METHYLPREDNISOLONE ACETATE 80 MG/ML IJ SUSP
80.0000 mg | Freq: Once | INTRAMUSCULAR | Status: AC
Start: 2022-09-12 — End: 2022-09-12
  Administered 2022-09-12: 80 mg via INTRAMUSCULAR

## 2022-09-12 MED ORDER — MELOXICAM 7.5 MG PO TABS
7.5000 mg | ORAL_TABLET | Freq: Every day | ORAL | 0 refills | Status: DC
Start: 2022-09-12 — End: 2022-10-20
  Filled 2022-09-12: qty 30, 30d supply, fill #0

## 2022-09-12 NOTE — Progress Notes (Signed)
Musculoskeletal Exam  Patient: Kathy King DOB: 1955-06-29  DOS: 09/12/2022  SUBJECTIVE:  Chief Complaint:   Chief Complaint  Patient presents with   Back Pain    Kathy King is a 67 y.o.  female for evaluation and treatment of back pain.   Onset:  5 days ago.  It started on the day after lifting some concrete blocks.  Location: lower left Character:  dull  Progression of issue:  is unchanged Associated symptoms: Radiates down her left lower extremity Denies bowel/bladder incontinence or weakness Treatment: to date has been OTC NSAIDS and acetaminophen.   Neurovascular symptoms: no  Past Medical History:  Diagnosis Date   Adenomatous colon polyp    tubular   CHICKENPOX, HX OF    Fibromyalgia    OSTEOPENIA    SCIATICA, LEFT 08/09/2009   s/p PT, NSAIDs   Unspecified vitamin D deficiency     Objective:  VITAL SIGNS: BP 128/76   Pulse 76   Temp 98.2 F (36.8 C)   Resp 18   Ht 5\' 3"  (1.6 m)   Wt 192 lb (87.1 kg)   SpO2 99%   BMI 34.01 kg/m  Constitutional: Well formed, well developed. No acute distress. HENT: Normocephalic, atraumatic.  Thorax & Lungs:  No accessory muscle use Musculoskeletal: low back.   Tenderness to palpation: Yes over the lumbar paraspinal musculature on the left Deformity: no Ecchymosis: no Straight leg test: negative for Poor hamstring flexibility b/l. Neurologic: Normal sensory function. No focal deficits noted. DTR's equal and symmetric in LE's. No clonus.  Gait is normal Psychiatric: Normal mood. Age appropriate judgment and insight. Alert & oriented x 3.    Assessment:  Acute left-sided low back pain without sciatica - Plan: meloxicam (MOBIC) 7.5 MG tablet  Plan: Depo-Medrol injection today, 80 mg IM.  Stretches/exercises, heat, ice, Tylenol, meloxicam 7.5 mg daily as needed starting tomorrow.  Hopefully this will be better for her stomach with the selective COX-2 inhibition.  F/u prn. The patient voiced  understanding and agreement to the plan.   Jilda Roche [place], DO 09/12/22  8:09 AM

## 2022-09-12 NOTE — Patient Instructions (Signed)
Start the meloxicam tomorrow.  Heat (pad or rice pillow in microwave) over affected area, 10-15 minutes twice daily.   Ice/cold pack over area for 10-15 min twice daily.  OK to take Tylenol 1000 mg (2 extra strength tabs) or 975 mg (3 regular strength tabs) every 6 hours as needed.  Let us know if you need anything.  EXERCISES  RANGE OF MOTION (ROM) AND STRETCHING EXERCISES - Low Back Pain Most people with lower back pain will find that their symptoms get worse with excessive bending forward (flexion) or arching at the lower back (extension). The exercises that will help resolve your symptoms will focus on the opposite motion.  If you have pain, numbness or tingling which travels down into your buttocks, leg or foot, the goal of the therapy is for these symptoms to move closer to your back and eventually resolve. Sometimes, these leg symptoms will get better, but your lower back pain may worsen. This is often an indication of progress in your rehabilitation. Be very alert to any changes in your symptoms and the activities in which you participated in the 24 hours prior to the change. Sharing this information with your caregiver will allow him or her to most efficiently treat your condition. These exercises may help you when beginning to rehabilitate your injury. Your symptoms may resolve with or without further involvement from your physician, physical therapist or athletic trainer. While completing these exercises, remember:  Restoring tissue flexibility helps normal motion to return to the joints. This allows healthier, less painful movement and activity. An effective stretch should be held for at least 30 seconds. A stretch should never be painful. You should only feel a gentle lengthening or release in the stretched tissue. FLEXION RANGE OF MOTION AND STRETCHING EXERCISES:  STRETCH - Flexion, Single Knee to Chest  Lie on a firm bed or floor with both legs extended in front of you. Keeping  one leg in contact with the floor, bring your opposite knee to your chest. Hold your leg in place by either grabbing behind your thigh or at your knee. Pull until you feel a gentle stretch in your low back. Hold 30 seconds. Slowly release your grasp and repeat the exercise with the opposite side. Repeat 2 times. Complete this exercise 3 times per week.   STRETCH - Flexion, Double Knee to Chest Lie on a firm bed or floor with both legs extended in front of you. Keeping one leg in contact with the floor, bring your opposite knee to your chest. Tense your stomach muscles to support your back and then lift your other knee to your chest. Hold your legs in place by either grabbing behind your thighs or at your knees. Pull both knees toward your chest until you feel a gentle stretch in your low back. Hold 30 seconds. Tense your stomach muscles and slowly return one leg at a time to the floor. Repeat 2 times. Complete this exercise 3 times per week.   STRETCH - Low Trunk Rotation Lie on a firm bed or floor. Keeping your legs in front of you, bend your knees so they are both pointed toward the ceiling and your feet are flat on the floor. Extend your arms out to the side. This will stabilize your upper body by keeping your shoulders in contact with the floor. Gently and slowly drop both knees together to one side until you feel a gentle stretch in your low back. Hold for 30 seconds. Tense your stomach muscles  to support your lower back as you bring your knees back to the starting position. Repeat the exercise to the other side. Repeat 2 times. Complete this exercise at least 3 times per week.   EXTENSION RANGE OF MOTION AND FLEXIBILITY EXERCISES:  STRETCH - Extension, Prone on Elbows  Lie on your stomach on the floor, a bed will be too soft. Place your palms about shoulder width apart and at the height of your head. Place your elbows under your shoulders. If this is too painful, stack pillows under  your chest. Allow your body to relax so that your hips drop lower and make contact more completely with the floor. Hold this position for 30 seconds. Slowly return to lying flat on the floor. Repeat 2 times. Complete this exercise 3 times per week.   RANGE OF MOTION - Extension, Prone Press Ups Lie on your stomach on the floor, a bed will be too soft. Place your palms about shoulder width apart and at the height of your head. Keeping your back as relaxed as possible, slowly straighten your elbows while keeping your hips on the floor. You may adjust the placement of your hands to maximize your comfort. As you gain motion, your hands will come more underneath your shoulders. Hold this position 30 seconds. Slowly return to lying flat on the floor. Repeat 2 times. Complete this exercise 3 times per week.   RANGE OF MOTION- Quadruped, Neutral Spine  Assume a hands and knees position on a firm surface. Keep your hands under your shoulders and your knees under your hips. You may place padding under your knees for comfort. Drop your head and point your tailbone toward the ground below you. This will round out your lower back like an angry cat. Hold this position for 30 seconds. Slowly lift your head and release your tail bone so that your back sags into a large arch, like an old horse. Hold this position for 30 seconds. Repeat this until you feel limber in your low back. Now, find your "sweet spot." This will be the most comfortable position somewhere between the two previous positions. This is your neutral spine. Once you have found this position, tense your stomach muscles to support your low back. Hold this position for 30 seconds. Repeat 2 times. Complete this exercise 3 times per week.   STRENGTHENING EXERCISES - Low Back Sprain These exercises may help you when beginning to rehabilitate your injury. These exercises should be done near your "sweet spot." This is the neutral, low-back arch,  somewhere between fully rounded and fully arched, that is your least painful position. When performed in this safe range of motion, these exercises can be used for people who have either a flexion or extension based injury. These exercises may resolve your symptoms with or without further involvement from your physician, physical therapist or athletic trainer. While completing these exercises, remember:  Muscles can gain both the endurance and the strength needed for everyday activities through controlled exercises. Complete these exercises as instructed by your physician, physical therapist or athletic trainer. Increase the resistance and repetitions only as guided. You may experience muscle soreness or fatigue, but the pain or discomfort you are trying to eliminate should never worsen during these exercises. If this pain does worsen, stop and make certain you are following the directions exactly. If the pain is still present after adjustments, discontinue the exercise until you can discuss the trouble with your caregiver.  STRENGTHENING - Deep Abdominals, Pelvic Tilt  Lie on a firm bed or floor. Keeping your legs in front of you, bend your knees so they are both pointed toward the ceiling and your feet are flat on the floor. Tense your lower abdominal muscles to press your low back into the floor. This motion will rotate your pelvis so that your tail bone is scooping upwards rather than pointing at your feet or into the floor. With a gentle tension and even breathing, hold this position for 3 seconds. Repeat 2 times. Complete this exercise 3 times per week.   STRENGTHENING - Abdominals, Crunches  Lie on a firm bed or floor. Keeping your legs in front of you, bend your knees so they are both pointed toward the ceiling and your feet are flat on the floor. Cross your arms over your chest. Slightly tip your chin down without bending your neck. Tense your abdominals and slowly lift your trunk high enough  to just clear your shoulder blades. Lifting higher can put excessive stress on the lower back and does not further strengthen your abdominal muscles. Control your return to the starting position. Repeat 2 times. Complete this exercise 3 times per week.   STRENGTHENING - Quadruped, Opposite UE/LE Lift  Assume a hands and knees position on a firm surface. Keep your hands under your shoulders and your knees under your hips. You may place padding under your knees for comfort. Find your neutral spine and gently tense your abdominal muscles so that you can maintain this position. Your shoulders and hips should form a rectangle that is parallel with the floor and is not twisted. Keeping your trunk steady, lift your right hand no higher than your shoulder and then your left leg no higher than your hip. Make sure you are not holding your breath. Hold this position for 30 seconds. Continuing to keep your abdominal muscles tense and your back steady, slowly return to your starting position. Repeat with the opposite arm and leg. Repeat 2 times. Complete this exercise 3 times per week.   STRENGTHENING - Abdominals and Quadriceps, Straight Leg Raise  Lie on a firm bed or floor with both legs extended in front of you. Keeping one leg in contact with the floor, bend the other knee so that your foot can rest flat on the floor. Find your neutral spine, and tense your abdominal muscles to maintain your spinal position throughout the exercise. Slowly lift your straight leg off the floor about 6 inches for a count of 3, making sure to not hold your breath. Still keeping your neutral spine, slowly lower your leg all the way to the floor. Repeat this exercise with each leg 2 times. Complete this exercise 3 times per week.  POSTURE AND BODY MECHANICS CONSIDERATIONS - Low Back Sprain Keeping correct posture when sitting, standing or completing your activities will reduce the stress put on different body tissues, allowing  injured tissues a chance to heal and limiting painful experiences. The following are general guidelines for improved posture.  While reading these guidelines, remember: The exercises prescribed by your provider will help you have the flexibility and strength to maintain correct postures. The correct posture provides the best environment for your joints to work. All of your joints have less wear and tear when properly supported by a spine with good posture. This means you will experience a healthier, less painful body. Correct posture must be practiced with all of your activities, especially prolonged sitting and standing. Correct posture is as important when doing repetitive  low-stress activities (typing) as it is when doing a single heavy-load activity (lifting).  RESTING POSITIONS Consider which positions are most painful for you when choosing a resting position. If you have pain with flexion-based activities (sitting, bending, stooping, squatting), choose a position that allows you to rest in a less flexed posture. You would want to avoid curling into a fetal position on your side. If your pain worsens with extension-based activities (prolonged standing, working overhead), avoid resting in an extended position such as sleeping on your stomach. Most people will find more comfort when they rest with their spine in a more neutral position, neither too rounded nor too arched. Lying on a non-sagging bed on your side with a pillow between your knees, or on your back with a pillow under your knees will often provide some relief. Keep in mind, being in any one position for a prolonged period of time, no matter how correct your posture, can still lead to stiffness.  PROPER SITTING POSTURE In order to minimize stress and discomfort on your spine, you must sit with correct posture. Sitting with good posture should be effortless for a healthy body. Returning to good posture is a gradual process. Many people can work  toward this most comfortably by using various supports until they have the flexibility and strength to maintain this posture on their own. When sitting with proper posture, your ears will fall over your shoulders and your shoulders will fall over your hips. You should use the back of the chair to support your upper back. Your lower back will be in a neutral position, just slightly arched. You may place a small pillow or folded towel at the base of your lower back for  support.  When working at a desk, create an environment that supports good, upright posture. Without extra support, muscles tire, which leads to excessive strain on joints and other tissues. Keep these recommendations in mind:  CHAIR: A chair should be able to slide under your desk when your back makes contact with the back of the chair. This allows you to work closely. The chair's height should allow your eyes to be level with the upper part of your monitor and your hands to be slightly lower than your elbows.  BODY POSITION Your feet should make contact with the floor. If this is not possible, use a foot rest. Keep your ears over your shoulders. This will reduce stress on your neck and low back.  INCORRECT SITTING POSTURES  If you are feeling tired and unable to assume a healthy sitting posture, do not slouch or slump. This puts excessive strain on your back tissues, causing more damage and pain. Healthier options include: Using more support, like a lumbar pillow. Switching tasks to something that requires you to be upright or walking. Talking a brief walk. Lying down to rest in a neutral-spine position.  PROLONGED STANDING WHILE SLIGHTLY LEANING FORWARD  When completing a task that requires you to lean forward while standing in one place for a long time, place either foot up on a stationary 2-4 inch high object to help maintain the best posture. When both feet are on the ground, the lower back tends to lose its slight inward  curve. If this curve flattens (or becomes too large), then the back and your other joints will experience too much stress, tire more quickly, and can cause pain.  CORRECT STANDING POSTURES Proper standing posture should be assumed with all daily activities, even if they only take  a few moments, like when brushing your teeth. As in sitting, your ears should fall over your shoulders and your shoulders should fall over your hips. You should keep a slight tension in your abdominal muscles to brace your spine. Your tailbone should point down to the ground, not behind your body, resulting in an over-extended swayback posture.   INCORRECT STANDING POSTURES  Common incorrect standing postures include a forward head, locked knees and/or an excessive swayback. WALKING Walk with an upright posture. Your ears, shoulders and hips should all line-up.  PROLONGED ACTIVITY IN A FLEXED POSITION When completing a task that requires you to bend forward at your waist or lean over a low surface, try to find a way to stabilize 3 out of 4 of your limbs. You can place a hand or elbow on your thigh or rest a knee on the surface you are reaching across. This will provide you more stability, so that your muscles do not tire as quickly. By keeping your knees relaxed, or slightly bent, you will also reduce stress across your lower back. CORRECT LIFTING TECHNIQUES  DO : Assume a wide stance. This will provide you more stability and the opportunity to get as close as possible to the object which you are lifting. Tense your abdominals to brace your spine. Bend at the knees and hips. Keeping your back locked in a neutral-spine position, lift using your leg muscles. Lift with your legs, keeping your back straight. Test the weight of unknown objects before attempting to lift them. Try to keep your elbows locked down at your sides in order get the best strength from your shoulders when carrying an object.   Always ask for help when  lifting heavy or awkward objects. INCORRECT LIFTING TECHNIQUES DO NOT:  Lock your knees when lifting, even if it is a small object. Bend and twist. Pivot at your feet or move your feet when needing to change directions. Assume that you can safely pick up even a paperclip without proper posture.

## 2022-09-12 NOTE — Addendum Note (Signed)
Addended by: Mervin Kung A on: 09/12/2022 09:38 AM   Modules accepted: Orders

## 2022-09-14 DIAGNOSIS — G4733 Obstructive sleep apnea (adult) (pediatric): Secondary | ICD-10-CM | POA: Diagnosis not present

## 2022-09-23 ENCOUNTER — Other Ambulatory Visit (HOSPITAL_BASED_OUTPATIENT_CLINIC_OR_DEPARTMENT_OTHER): Payer: Self-pay

## 2022-09-23 ENCOUNTER — Other Ambulatory Visit: Payer: Self-pay | Admitting: Family Medicine

## 2022-09-23 DIAGNOSIS — M797 Fibromyalgia: Secondary | ICD-10-CM

## 2022-09-23 DIAGNOSIS — R195 Other fecal abnormalities: Secondary | ICD-10-CM

## 2022-09-23 MED ORDER — AMITRIPTYLINE HCL 10 MG PO TABS
10.0000 mg | ORAL_TABLET | Freq: Every day | ORAL | 2 refills | Status: DC
Start: 2022-09-23 — End: 2023-01-01
  Filled 2022-09-23: qty 90, 90d supply, fill #0
  Filled 2022-12-18: qty 90, 90d supply, fill #1

## 2022-10-09 ENCOUNTER — Other Ambulatory Visit: Payer: 59

## 2022-10-10 ENCOUNTER — Other Ambulatory Visit (HOSPITAL_BASED_OUTPATIENT_CLINIC_OR_DEPARTMENT_OTHER): Payer: Self-pay

## 2022-10-10 ENCOUNTER — Other Ambulatory Visit: Payer: Self-pay | Admitting: Family Medicine

## 2022-10-10 ENCOUNTER — Other Ambulatory Visit (INDEPENDENT_AMBULATORY_CARE_PROVIDER_SITE_OTHER): Payer: 59

## 2022-10-10 DIAGNOSIS — R300 Dysuria: Secondary | ICD-10-CM

## 2022-10-10 LAB — POCT URINALYSIS DIP (MANUAL ENTRY)
Bilirubin, UA: NEGATIVE
Blood, UA: NEGATIVE
Glucose, UA: NEGATIVE mg/dL
Ketones, POC UA: NEGATIVE mg/dL
Nitrite, UA: NEGATIVE
Protein Ur, POC: NEGATIVE mg/dL
Spec Grav, UA: 1.01 (ref 1.010–1.025)
Urobilinogen, UA: 0.2 U/dL
pH, UA: 5 (ref 5.0–8.0)

## 2022-10-10 MED ORDER — NITROFURANTOIN MONOHYD MACRO 100 MG PO CAPS
100.0000 mg | ORAL_CAPSULE | Freq: Two times a day (BID) | ORAL | 0 refills | Status: AC
  Filled 2022-10-10: qty 10, 5d supply, fill #0

## 2022-10-12 LAB — URINE CULTURE
MICRO NUMBER:: 15084345
SPECIMEN QUALITY:: ADEQUATE

## 2022-10-15 DIAGNOSIS — G4733 Obstructive sleep apnea (adult) (pediatric): Secondary | ICD-10-CM | POA: Diagnosis not present

## 2022-10-20 ENCOUNTER — Other Ambulatory Visit: Payer: Self-pay | Admitting: Family Medicine

## 2022-10-20 ENCOUNTER — Other Ambulatory Visit (HOSPITAL_BASED_OUTPATIENT_CLINIC_OR_DEPARTMENT_OTHER): Payer: Self-pay

## 2022-10-20 DIAGNOSIS — M545 Low back pain, unspecified: Secondary | ICD-10-CM

## 2022-10-20 MED ORDER — MELOXICAM 7.5 MG PO TABS
7.5000 mg | ORAL_TABLET | Freq: Every day | ORAL | 0 refills | Status: AC
Start: 2022-10-20 — End: 2023-01-18
  Filled 2022-10-20: qty 90, 90d supply, fill #0

## 2022-10-28 DIAGNOSIS — G4733 Obstructive sleep apnea (adult) (pediatric): Secondary | ICD-10-CM | POA: Diagnosis not present

## 2022-11-03 ENCOUNTER — Other Ambulatory Visit (INDEPENDENT_AMBULATORY_CARE_PROVIDER_SITE_OTHER): Payer: 59

## 2022-11-03 ENCOUNTER — Encounter: Payer: Self-pay | Admitting: Family Medicine

## 2022-11-03 DIAGNOSIS — R300 Dysuria: Secondary | ICD-10-CM

## 2022-11-03 DIAGNOSIS — R82998 Other abnormal findings in urine: Secondary | ICD-10-CM

## 2022-11-03 LAB — POC URINALSYSI DIPSTICK (AUTOMATED)
Bilirubin, UA: NEGATIVE
Blood, UA: NEGATIVE
Glucose, UA: NEGATIVE
Ketones, UA: NEGATIVE
Nitrite, UA: NEGATIVE
Protein, UA: NEGATIVE
Spec Grav, UA: 1.015 (ref 1.010–1.025)
Urobilinogen, UA: 0.2 E.U./dL
pH, UA: 6 (ref 5.0–8.0)

## 2022-11-04 LAB — URINE CULTURE: MICRO NUMBER:: 15170341

## 2022-11-06 LAB — URINE CULTURE: SPECIMEN QUALITY:: ADEQUATE

## 2022-11-14 DIAGNOSIS — G4733 Obstructive sleep apnea (adult) (pediatric): Secondary | ICD-10-CM | POA: Diagnosis not present

## 2022-11-26 DIAGNOSIS — H524 Presbyopia: Secondary | ICD-10-CM | POA: Diagnosis not present

## 2022-11-26 DIAGNOSIS — H35371 Puckering of macula, right eye: Secondary | ICD-10-CM | POA: Diagnosis not present

## 2022-11-28 DIAGNOSIS — G4733 Obstructive sleep apnea (adult) (pediatric): Secondary | ICD-10-CM | POA: Diagnosis not present

## 2022-12-11 ENCOUNTER — Encounter (INDEPENDENT_AMBULATORY_CARE_PROVIDER_SITE_OTHER): Payer: Self-pay

## 2022-12-15 DIAGNOSIS — G4733 Obstructive sleep apnea (adult) (pediatric): Secondary | ICD-10-CM | POA: Diagnosis not present

## 2022-12-29 DIAGNOSIS — G4733 Obstructive sleep apnea (adult) (pediatric): Secondary | ICD-10-CM | POA: Diagnosis not present

## 2022-12-30 NOTE — Patient Instructions (Incomplete)
It was great to see you again today, I will be in touch with your labs ASAP Flu, covid this fall I am glad to order a CT coronary if you ever want one  Keep the biopsy site clean and covered for 24 hours- then you can shower, apply a band- aid as needed.  Let me know if any concerns about infection, etc

## 2022-12-30 NOTE — Progress Notes (Unsigned)
[facility] [address] [place], [place] [address] [phone]  Date:  01/01/2023   Name:  Rarity Mcgowen Grajales   DOB:  [birth year]   MRN:  [id number]  PCP:  Pearline Cables, MD    Chief Complaint: No chief complaint on file.   History of Present Illness:  Araiah Krenz Kleinschmidt is a 67 y.o. very pleasant female patient who presents with the following:  Patient seen today for a physical- history of osteopenia, vit D def, cough variant asthma. She also has a skin finding on her neck she would like removed Most recent visit with myself was in November when she was dealing with a hemorrhoid We also had her see rheumatology last year for a low positive ANA, their diagnosis is likely fibromyalgia  Pap is up-to-date Mammogram up-to-date Bone density can be updated Can update labs today  ??  Would patient like vitamin D Patient Active Problem List   Diagnosis Date Noted   OSA on CPAP 05/01/2022   Fibromyalgia 03/14/2022   Tarsal tunnel syndrome of right side 01/30/2022   Postviral fatigue syndrome 11/07/2021   Menopausal syndrome (hot flashes) 11/07/2021   Other insomnia 11/07/2021   Osteopenia 05/12/2019   Synovitis of left foot 05/25/2018   Low back pain 12/12/2016   Vitamin D deficiency    Cough variant asthma    Lumbar radiculopathy 10/22/2011   SCIATICA, LEFT 08/09/2009   Disorder of bone and cartilage 08/09/2009    Past Medical History:  Diagnosis Date   Adenomatous colon polyp    tubular   CHICKENPOX, HX OF    Fibromyalgia    OSTEOPENIA    SCIATICA, LEFT 08/09/2009   s/p PT, NSAIDs   Unspecified vitamin D deficiency     Past Surgical History:  Procedure Laterality Date   Arthroscopic shoulder  2008   CERVICAL FUSION  2006   CESAREAN SECTION  89 & 93   x's 2   TONSILLECTOMY  1965    Social History   Tobacco Use   Smoking status: Never   Smokeless tobacco: Never  Vaping Use   Vaping  status: Never Used  Substance Use Topics   Alcohol use: No    Alcohol/week: 0.0 standard drinks of alcohol   Drug use: No    Family History  Problem Relation Age of Onset   Memory loss Mother    Hypertension Mother    Hyperlipidemia Mother    Breast cancer Maternal Grandmother    Seizures Son    Arthritis Other        grandparent   Colon cancer Neg Hx    Esophageal cancer Neg Hx    Rectal cancer Neg Hx    Stomach cancer Neg Hx     Allergies  Allergen Reactions   Sulfonamide Derivatives     REACTION: unspecified; as child    Medication list has been reviewed and updated.  Current Outpatient Medications on File Prior to Visit  Medication Sig Dispense Refill   amitriptyline (ELAVIL) 10 MG tablet Take 1 tablet (10 mg total) by mouth at bedtime. 90 tablet 2   Cholecalciferol (VITAMIN D) 2000 UNITS CAPS Take by mouth.     fluticasone (FLONASE) 50 MCG/ACT nasal spray Place 2 sprays into both nostrils daily. 16 g 6   gabapentin (NEURONTIN) 300 MG capsule Take 1 capsule (300 mg total) by mouth at bedtime. 90 capsule 3   meloxicam (MOBIC) 7.5 MG  tablet Take 1 tablet (7.5 mg total) by mouth daily. 90 tablet 0   methylcellulose (CITRUCEL) oral powder Take 1 packet by mouth daily.     No current facility-administered medications on file prior to visit.    Review of Systems:  As per HPI- otherwise negative.   Physical Examination: There were no vitals filed for this visit. There were no vitals filed for this visit. There is no height or weight on file to calculate BMI. Ideal Body Weight:    GEN: no acute distress. HEENT: Atraumatic, Normocephalic.  Ears and Nose: No external deformity. CV: RRR, No M/G/R. No JVD. No thrill. No extra heart sounds. PULM: CTA B, no wheezes, crackles, rhonchi. No retractions. No resp. distress. No accessory muscle use. ABD: S, NT, ND, +BS. No rebound. No HSM. EXTR: No c/c/e PSYCH: Normally interactive. Conversant.    Assessment and  Plan: *** Physical exam today.  Encouraged healthy diet and exercise routine Signed Abbe Amsterdam, MD

## 2023-01-01 ENCOUNTER — Encounter: Payer: Self-pay | Admitting: Family Medicine

## 2023-01-01 ENCOUNTER — Ambulatory Visit (INDEPENDENT_AMBULATORY_CARE_PROVIDER_SITE_OTHER): Payer: 59 | Admitting: Family Medicine

## 2023-01-01 ENCOUNTER — Other Ambulatory Visit (HOSPITAL_BASED_OUTPATIENT_CLINIC_OR_DEPARTMENT_OTHER): Payer: Self-pay

## 2023-01-01 VITALS — BP 122/70 | HR 70 | Temp 97.8°F | Resp 18 | Ht 63.0 in | Wt 197.2 lb

## 2023-01-01 DIAGNOSIS — M797 Fibromyalgia: Secondary | ICD-10-CM | POA: Diagnosis not present

## 2023-01-01 DIAGNOSIS — R195 Other fecal abnormalities: Secondary | ICD-10-CM | POA: Diagnosis not present

## 2023-01-01 DIAGNOSIS — Z131 Encounter for screening for diabetes mellitus: Secondary | ICD-10-CM | POA: Diagnosis not present

## 2023-01-01 DIAGNOSIS — R0981 Nasal congestion: Secondary | ICD-10-CM

## 2023-01-01 DIAGNOSIS — L820 Inflamed seborrheic keratosis: Secondary | ICD-10-CM

## 2023-01-01 DIAGNOSIS — M5416 Radiculopathy, lumbar region: Secondary | ICD-10-CM | POA: Diagnosis not present

## 2023-01-01 DIAGNOSIS — Z1329 Encounter for screening for other suspected endocrine disorder: Secondary | ICD-10-CM | POA: Diagnosis not present

## 2023-01-01 DIAGNOSIS — Z0001 Encounter for general adult medical examination with abnormal findings: Secondary | ICD-10-CM

## 2023-01-01 DIAGNOSIS — Z130 Encounter for screening for diseases of the blood and blood-forming organs and certain disorders involving the immune mechanism: Secondary | ICD-10-CM | POA: Diagnosis not present

## 2023-01-01 DIAGNOSIS — L989 Disorder of the skin and subcutaneous tissue, unspecified: Secondary | ICD-10-CM

## 2023-01-01 DIAGNOSIS — Z0000 Encounter for general adult medical examination without abnormal findings: Secondary | ICD-10-CM

## 2023-01-01 DIAGNOSIS — Z13220 Encounter for screening for lipoid disorders: Secondary | ICD-10-CM

## 2023-01-01 LAB — COMPREHENSIVE METABOLIC PANEL
ALT: 30 U/L (ref 0–35)
AST: 25 U/L (ref 0–37)
Albumin: 4.3 g/dL (ref 3.5–5.2)
Alkaline Phosphatase: 83 U/L (ref 39–117)
BUN: 17 mg/dL (ref 6–23)
CO2: 30 meq/L (ref 19–32)
Calcium: 9.6 mg/dL (ref 8.4–10.5)
Chloride: 104 meq/L (ref 96–112)
Creatinine, Ser: 0.89 mg/dL (ref 0.40–1.20)
GFR: 68.37 mL/min (ref >60.00)
Glucose, Bld: 82 mg/dL (ref 70–99)
Potassium: 4.1 meq/L (ref 3.5–5.1)
Sodium: 140 meq/L (ref 135–145)
Total Bilirubin: 0.6 mg/dL (ref 0.2–1.2)
Total Protein: 6.5 g/dL (ref 6.0–8.3)

## 2023-01-01 LAB — LIPID PANEL
Cholesterol: 227 mg/dL — ABNORMAL HIGH (ref 0–200)
HDL: 75.6 mg/dL (ref >39.00)
LDL Cholesterol: 138 mg/dL — ABNORMAL HIGH (ref 0–99)
NonHDL: 151.86
Total CHOL/HDL Ratio: 3
Triglycerides: 71 mg/dL (ref 0.0–149.0)
VLDL: 14.2 mg/dL (ref 0.0–40.0)

## 2023-01-01 LAB — CBC
HCT: 41 % (ref 36.0–46.0)
Hemoglobin: 13.2 g/dL (ref 12.0–15.0)
MCHC: 32.3 g/dL (ref 30.0–36.0)
MCV: 92.3 fl (ref 78.0–100.0)
Platelets: 246 10*3/uL (ref 150.0–400.0)
RBC: 4.44 Mil/uL (ref 3.87–5.11)
RDW: 12.8 % (ref 11.5–15.5)
WBC: 5.9 10*3/uL (ref 4.0–10.5)

## 2023-01-01 LAB — HEMOGLOBIN A1C: Hgb A1c MFr Bld: 5.5 % (ref 4.6–6.5)

## 2023-01-01 LAB — TSH: TSH: 2.54 u[IU]/mL (ref 0.35–5.50)

## 2023-01-01 LAB — POC COVID19 BINAXNOW: SARS Coronavirus 2 Ag: NEGATIVE

## 2023-01-01 MED ORDER — AMITRIPTYLINE HCL 10 MG PO TABS
10.0000 mg | ORAL_TABLET | Freq: Every day | ORAL | 3 refills | Status: DC
  Filled 2023-01-01 – 2023-03-21 (×2): qty 90, 90d supply, fill #0
  Filled 2023-07-15: qty 90, 90d supply, fill #1
  Filled 2023-11-17: qty 90, 90d supply, fill #2

## 2023-01-01 MED ORDER — FLUTICASONE PROPIONATE 50 MCG/ACT NA SUSP
2.0000 | Freq: Every day | NASAL | 6 refills | Status: DC
Start: 2023-01-01 — End: 2023-02-16
  Filled 2023-01-01: qty 16, 30d supply, fill #0

## 2023-01-01 MED ORDER — GABAPENTIN 300 MG PO CAPS
300.0000 mg | ORAL_CAPSULE | Freq: Every day | ORAL | 1 refills | Status: DC
  Filled 2023-01-01 – 2023-01-23 (×2): qty 90, 90d supply, fill #0
  Filled 2023-04-18: qty 90, 90d supply, fill #1

## 2023-01-05 ENCOUNTER — Encounter: Payer: Self-pay | Admitting: Family Medicine

## 2023-01-15 DIAGNOSIS — G4733 Obstructive sleep apnea (adult) (pediatric): Secondary | ICD-10-CM | POA: Diagnosis not present

## 2023-01-23 ENCOUNTER — Other Ambulatory Visit (HOSPITAL_BASED_OUTPATIENT_CLINIC_OR_DEPARTMENT_OTHER): Payer: Self-pay

## 2023-01-30 ENCOUNTER — Encounter: Payer: Self-pay | Admitting: Family Medicine

## 2023-01-30 DIAGNOSIS — Z111 Encounter for screening for respiratory tuberculosis: Secondary | ICD-10-CM

## 2023-02-02 ENCOUNTER — Other Ambulatory Visit (INDEPENDENT_AMBULATORY_CARE_PROVIDER_SITE_OTHER): Payer: 59

## 2023-02-02 DIAGNOSIS — Z111 Encounter for screening for respiratory tuberculosis: Secondary | ICD-10-CM | POA: Diagnosis not present

## 2023-02-04 LAB — QUANTIFERON-TB GOLD PLUS
Mitogen-NIL: >10 [IU]/mL
NIL: 0.03 [IU]/mL
QuantiFERON-TB Gold Plus: NEGATIVE
TB1-NIL: 0.01 [IU]/mL
TB2-NIL: 0.02 [IU]/mL

## 2023-02-05 DIAGNOSIS — G4733 Obstructive sleep apnea (adult) (pediatric): Secondary | ICD-10-CM | POA: Diagnosis not present

## 2023-02-14 DIAGNOSIS — G4733 Obstructive sleep apnea (adult) (pediatric): Secondary | ICD-10-CM | POA: Diagnosis not present

## 2023-02-16 ENCOUNTER — Ambulatory Visit (INDEPENDENT_AMBULATORY_CARE_PROVIDER_SITE_OTHER): Payer: 59 | Admitting: Family Medicine

## 2023-02-16 ENCOUNTER — Other Ambulatory Visit (HOSPITAL_BASED_OUTPATIENT_CLINIC_OR_DEPARTMENT_OTHER): Payer: Self-pay

## 2023-02-16 VITALS — BP 122/72 | HR 73 | Resp 18 | Ht 63.0 in | Wt 195.0 lb

## 2023-02-16 DIAGNOSIS — R0981 Nasal congestion: Secondary | ICD-10-CM

## 2023-02-16 DIAGNOSIS — M79673 Pain in unspecified foot: Secondary | ICD-10-CM | POA: Diagnosis not present

## 2023-02-16 DIAGNOSIS — G8929 Other chronic pain: Secondary | ICD-10-CM

## 2023-02-16 MED ORDER — TRAMADOL HCL 50 MG PO TABS
50.0000 mg | ORAL_TABLET | Freq: Three times a day (TID) | ORAL | 0 refills | Status: DC | PRN
Start: 2023-02-16 — End: 2023-11-19
  Filled 2023-02-16: qty 20, 7d supply, fill #0

## 2023-02-16 MED ORDER — FLUTICASONE PROPIONATE 50 MCG/ACT NA SUSP
2.0000 | Freq: Every day | NASAL | 6 refills | Status: AC
  Filled 2023-02-16: qty 48, 90d supply, fill #0

## 2023-02-16 NOTE — Progress Notes (Signed)
[facility] [address] [place], [place] [address] [phone]  Date:  02/16/2023   Name:  Kathy King   DOB:  08/25/1955   MRN:  [id number]  PCP:  Pearline Cables, MD    Chief Complaint: pain, med refills   History of Present Illness:  Kathy King is a 67 y.o. very pleasant female patient who presents with the following:  Pt seen today with concern of pain - she notes both feet aching more than usual She got some new shoes yesterday which she hopes will help  They will be visiting [place] soon for about 8 days-she is concerned about how much walking this will entail   She has been seen by rheumatology in the past due to positive ANA- they did not think an autoimmune disorder, likely fibromyalgia She does tend to have hip and back pain  She is taking a lot of ibuprofen but is not sure how much this is helping She is on gabapentin 300 and amitriptyline at night She does not note symptoms of restless legs Normal renal function She is not taking tylenol generally; would be willing to try alternating this with ibuprofen  She is a bit busier than normal but otherwise no major change in her activity level  She will soon be leaving her current job for a job which is a bit less physically intense, we hope this will help   Patient Active Problem List   Diagnosis Date Noted   OSA on CPAP 05/01/2022   Fibromyalgia 03/14/2022   Tarsal tunnel syndrome of right side 01/30/2022   Postviral fatigue syndrome 11/07/2021   Menopausal syndrome (hot flashes) 11/07/2021   Other insomnia 11/07/2021   Osteopenia 05/12/2019   Synovitis of left foot 05/25/2018   Low back pain 12/12/2016   Vitamin D deficiency    Cough variant asthma    Lumbar radiculopathy 10/22/2011   SCIATICA, LEFT 08/09/2009   Disorder of bone and cartilage 08/09/2009    Past Medical History:  Diagnosis Date   Adenomatous colon polyp     tubular   CHICKENPOX, HX OF    Fibromyalgia    OSTEOPENIA    SCIATICA, LEFT 08/09/2009   s/p PT, NSAIDs   Unspecified vitamin D deficiency     Past Surgical History:  Procedure Laterality Date   Arthroscopic shoulder  2008   CERVICAL FUSION  2006   CESAREAN SECTION  88 & 93   x's 2   TONSILLECTOMY  1965    Social History   Tobacco Use   Smoking status: Never   Smokeless tobacco: Never  Vaping Use   Vaping status: Never Used  Substance Use Topics   Alcohol use: No    Alcohol/week: 0.0 standard drinks of alcohol   Drug use: No    Family History  Problem Relation Age of Onset   Memory loss Mother    Hypertension Mother    Hyperlipidemia Mother    Breast cancer Maternal Grandmother    Seizures Son    Arthritis Other        grandparent   Colon cancer Neg Hx    Esophageal cancer Neg Hx    Rectal cancer Neg Hx    Stomach cancer Neg Hx     Allergies  Allergen Reactions   Sulfonamide Derivatives     REACTION: unspecified; as child    Medication list has been reviewed and updated.  Current  Outpatient Medications on File Prior to Visit  Medication Sig Dispense Refill   amitriptyline (ELAVIL) 10 MG tablet Take 1 tablet (10 mg total) by mouth at bedtime. 90 tablet 3   Cholecalciferol (VITAMIN D) 2000 UNITS CAPS Take by mouth.     fluticasone (FLONASE) 50 MCG/ACT nasal spray Place 2 sprays into both nostrils daily. 16 g 6   gabapentin (NEURONTIN) 300 MG capsule Take 1 capsule (300 mg total) by mouth at bedtime. 90 capsule 1   methylcellulose (CITRUCEL) oral powder Take 1 packet by mouth daily.     No current facility-administered medications on file prior to visit.    Review of Systems:  As per HPI- otherwise negative.   Physical Examination: Vitals:   02/16/23 0841  BP: 122/72  Pulse: 73  Resp: 18  SpO2: 99%   Vitals:   02/16/23 0841  Weight: 195 lb (88.5 kg)  Height: 5\' 3"  (1.6 m)   Body mass index is 34.54 kg/m. Ideal Body Weight: Weight in  (lb) to have BMI = 25: 140.8  GEN: no acute distress.  Mildly obese, looks well HEENT: Atraumatic, Normocephalic.  Ears and Nose: No external deformity. CV: RRR, No M/G/R. No JVD. No thrill. No extra heart sounds. PULM: CTA B, no wheezes, crackles, rhonchi. No retractions. No resp. distress. No accessory muscle use. ABD: S, NT, ND, +BS. No rebound. No HSM. EXTR: No c/c/e PSYCH: Normally interactive. Conversant.  Foot exam is normal bilaterally, strong pulses, patient denies any numbness or tingling.  Negative squeeze test, I am not able to elicit tenderness on foot exam  Assessment and Plan: Chronic foot pain, unspecified laterality - Plan: traMADol (ULTRAM) 50 MG tablet  Stuffy nose - Plan: fluticasone (FLONASE) 50 MCG/ACT nasal spray  Patient seen today for follow-up.  She has been dealing with foot pain for a long time, recently is experiencing a flareup.  She plans to travel to [place] to see family in a few days and is worried about all the walking that will be needed.  She has used tramadol previously for pain and tolerated well, refilled this for her today.  Caution regarding sedation and combined serotonin agonist with amitriptyline. I recommended that she use tramadol during the day, then she can use her gabapentin and amitriptyline at that time  She will let me know if further assistance is needed  Signed Abbe Amsterdam, MD

## 2023-02-18 ENCOUNTER — Telehealth: Payer: Self-pay

## 2023-03-02 ENCOUNTER — Ambulatory Visit: Payer: 59 | Admitting: Family Medicine

## 2023-03-02 ENCOUNTER — Other Ambulatory Visit: Payer: Self-pay

## 2023-03-02 ENCOUNTER — Ambulatory Visit (INDEPENDENT_AMBULATORY_CARE_PROVIDER_SITE_OTHER): Payer: 59

## 2023-03-02 VITALS — BP 134/74 | HR 77 | Ht 63.0 in | Wt 197.0 lb

## 2023-03-02 DIAGNOSIS — M79672 Pain in left foot: Secondary | ICD-10-CM | POA: Diagnosis not present

## 2023-03-02 DIAGNOSIS — M2012 Hallux valgus (acquired), left foot: Secondary | ICD-10-CM | POA: Diagnosis not present

## 2023-03-02 DIAGNOSIS — G8929 Other chronic pain: Secondary | ICD-10-CM

## 2023-03-02 DIAGNOSIS — M19072 Primary osteoarthritis, left ankle and foot: Secondary | ICD-10-CM | POA: Diagnosis not present

## 2023-03-02 NOTE — Progress Notes (Signed)
   Rubin Payor, PhD, LAT, ATC acting as a scribe for Kathy Graham, MD.  Kathy King is a 67 y.o. female who presents to [facility] today for L foot pain. Pt was previously seen by Dr. Katrinka King on 01/30/22 for R foot pain.  Today, pt c/o L foot pain ongoing for several wks. She just got back from a trip to [place]. Pt locates pain to the plantar aspect of her L foot, laterally. 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   Aggravates: standing Treatments tried: changing shoes  Pertinent review of systems: No fevers or chills  Relevant historical information: Sleep apnea.  Osteopenia.   Exam:  BP 134/74   Pulse 77   Ht 5\' 3"  (1.6 m)   Wt 197 lb (89.4 kg)   SpO2 98%   BMI 34.90 kg/m  General: Well Developed, well nourished, and in no acute distress.   MSK: Left foot normal appearing. Tender palpation mildly at dorsal lateral midfoot around the area of the cuboid.  Nontender proximal fifth metatarsal.  Normal foot and ankle motion. Stable ligamentous exam. Intact strength.   Lab and Radiology Results  Diagnostic Limited MSK Ultrasound of: Left foot Minimal hypoechoic fluid around the peroneal tendons in the ankle.  Normal appearance of the peroneal brevis tendon at insertion onto the fifth metatarsal proximally. Impression: Unclear source of foot pain.  X-ray images left foot obtained today personally and independently interpreted No significant abnormalities in the area of pain dorsal lateral midfoot Await formal radiology review  Assessment and Plan: 67 y.o. female with left midfoot pain.  Etiology is somewhat unclear.  I am suspicious that she has overload of the cuboid or lateral midfoot structures.  Plan for an arch strap.  If not improving consider injection in the near future.  Would probably inject the cuboid articulation with the proximal fifth metatarsal   PDMP not reviewed this encounter. Orders Placed This  Encounter  Procedures   [place] LIMITED JOINT SPACE STRUCTURES LOW LEFT(NO LINKED CHARGES)    Order Specific Question:   Reason for Exam (SYMPTOM  OR DIAGNOSIS REQUIRED)    Answer:   left foot pain    Order Specific Question:   Preferred imaging location?    Answer:   [facility] Foot Complete Left    Standing Status:   Future    Number of Occurrences:   1    Standing Expiration Date:   03/01/2024    Order Specific Question:   Reason for Exam (SYMPTOM  OR DIAGNOSIS REQUIRED)    Answer:   left foot pain    Order Specific Question:   Preferred imaging location?    Answer:   [facility]   No orders of the defined types were placed in this encounter.    Discussed warning signs or symptoms. Please see discharge instructions. Patient expresses understanding.   The above documentation has been reviewed and is accurate and complete Kathy King, M.D.

## 2023-03-02 NOTE — Patient Instructions (Addendum)
Thank you for coming in today.   Please use Voltaren gel (Generic Diclofenac Gel) up to 4x daily for pain as needed.  This is available over-the-counter as both the name brand Voltaren gel and the generic diclofenac gel.   Please get an Xray today before you leave   Arch Strap  If not better in 2 weeks, we can consider doing an injection

## 2023-03-08 DIAGNOSIS — G4733 Obstructive sleep apnea (adult) (pediatric): Secondary | ICD-10-CM | POA: Diagnosis not present

## 2023-03-22 ENCOUNTER — Other Ambulatory Visit: Payer: Self-pay

## 2023-03-22 ENCOUNTER — Encounter: Payer: Self-pay | Admitting: Family Medicine

## 2023-03-23 ENCOUNTER — Encounter: Payer: Self-pay | Admitting: Family Medicine

## 2023-03-23 NOTE — Telephone Encounter (Signed)
Repeat message. Forwarded 1st message to Dr. [name] to advise.

## 2023-03-23 NOTE — Progress Notes (Signed)
Left foot x-ray shows mild arthritis and a bunion at the big toe and a bone spur at the Achilles tendon.

## 2023-03-30 ENCOUNTER — Encounter: Payer: Self-pay | Admitting: Family Medicine

## 2023-03-30 ENCOUNTER — Ambulatory Visit: Payer: 59 | Admitting: Family Medicine

## 2023-03-30 ENCOUNTER — Ambulatory Visit: Payer: Medicare HMO | Admitting: Family Medicine

## 2023-03-30 ENCOUNTER — Other Ambulatory Visit: Payer: Self-pay

## 2023-03-30 VITALS — BP 122/80 | HR 69 | Ht 63.0 in | Wt 198.0 lb

## 2023-03-30 DIAGNOSIS — M25571 Pain in right ankle and joints of right foot: Secondary | ICD-10-CM | POA: Diagnosis not present

## 2023-03-30 DIAGNOSIS — G8929 Other chronic pain: Secondary | ICD-10-CM

## 2023-03-30 DIAGNOSIS — M79672 Pain in left foot: Secondary | ICD-10-CM

## 2023-03-30 NOTE — Patient Instructions (Addendum)
Thank you for coming in today.   You received an injection today. Seek immediate medical attention if the joint becomes red, extremely painful, or is oozing fluid.   Home Exercise Program - View at [email or web address]-exercise-[email or web address] using code: [id number]  Let me know how you are feeling

## 2023-03-30 NOTE — Progress Notes (Signed)
I, Kathy King, CMA acting as a scribe for Kathy Graham, MD.  Kathy King is a 67 y.o. female who presents to [facility] today for f/u L foot pain. Pt was last seen by Dr. [name] on 03/02/23 and was advised to wear an arch strap.  Today, pt reports continued foot pain. No relief with Voltaren. Wearing Arch support strap today. Feels that sx may be slightly worse. Locates pain to lateral aspect of the foot. Also notes tenderness in the right foot, started about 1 week ago. Locates pain to medial aspect of the right ankle, denies injury.   Dx imaging: 03/02/23 L foot XR  Pertinent review of systems: No fevers or chills  Relevant historical information: Chronic bilateral foot pain   Exam:  BP 122/80   Pulse 69   Ht 5\' 3"  (1.6 m)   Wt 198 lb (89.8 kg)   SpO2 99%   BMI 35.07 kg/m  General: Well Developed, well nourished, and in no acute distress.   MSK: Left foot normal appearing Mildly tender palpation lateral dorsal midfoot  Right foot and ankle normal-appearing Tender palpation medial ankle along the course of the posterior tibialis tendon.  Normal foot and ankle motion.  Some pain with resisted foot plantarflexion and inversion.   Lab and Radiology Results  Procedure: Real-time Ultrasound Guided Injection of left midfoot dorsal aspect Device: Philips Affiniti 50G/GE Logiq Images permanently stored and available for review in PACS Verbal informed consent obtained.  Discussed risks and benefits of procedure. Warned about infection, bleeding, hyperglycemia damage to structures among others. Patient expresses understanding and agreement Time-out conducted.   Noted no overlying erythema, induration, or other signs of local infection.   Skin prepped in a sterile fashion.   Local anesthesia: Topical Ethyl chloride.   With sterile technique and under real time ultrasound guidance: 40 mg of Kenalog and 1 mL of lidocaine injected into dorsal  lateral midfoot. Fluid seen entering the joint capsule.   Completed without difficulty   Pain moderately immediately resolved suggesting accurate placement of the medication.   Advised to call if fevers/chills, erythema, induration, drainage, or persistent bleeding.   Images permanently stored and available for review in the ultrasound unit.  Impression: Technically successful ultrasound guided injection.    EXAM: LEFT FOOT - COMPLETE 3+ VIEW   COMPARISON:  Left foot radiographs 05/18/2018   FINDINGS: Mild hallux valgus and mild lateral great toe metatarsophalangeal joint space narrowing and peripheral spurring is similar to prior. Minimal dorsal great toe metatarsal head degenerative spurring. Tiny plantar calcaneal heel spur is unchanged. New mild chronic enthesopathic change at the Achilles insertion on the calcaneus.   No acute fracture or dislocation.   IMPRESSION: 1. Mild hallux valgus and mild great toe metatarsophalangeal joint osteoarthritis, similar to prior. 2. New mild chronic enthesopathic change at the Achilles insertion on the calcaneus.     Electronically Signed   By: Neita Garnet M.D.   On: 03/20/2023 16:31  I, Kathy King, personally (independently) visualized and performed the interpretation of the images attached in this note.    Assessment and Plan: 67 y.o. female with left midfoot pain.  Etiology is somewhat unclear.  X-ray was largely benign appearing in the region of pain.  Plan for trial of injection.  If not better consider MRI.  Right medial ankle pain thought to be due to posterior tibialis tenosynovitis.  Home exercise program reviewed today.   PDMP not reviewed this encounter. Orders  Placed This Encounter  Procedures   [place] LIMITED JOINT SPACE STRUCTURES LOW LEFT(NO LINKED CHARGES)    Order Specific Question:   Reason for Exam (SYMPTOM  OR DIAGNOSIS REQUIRED)    Answer:   left foot pain    Order Specific Question:   Preferred imaging  location?    Answer:    [facility]   No orders of the defined types were placed in this encounter.    Discussed warning signs or symptoms. Please see discharge instructions. Patient expresses understanding.   The above documentation has been reviewed and is accurate and complete Kathy King, M.D.

## 2023-04-07 ENCOUNTER — Encounter: Payer: Self-pay | Admitting: Family Medicine

## 2023-04-08 ENCOUNTER — Other Ambulatory Visit: Payer: Self-pay | Admitting: Obstetrics and Gynecology

## 2023-04-08 DIAGNOSIS — Z1231 Encounter for screening mammogram for malignant neoplasm of breast: Secondary | ICD-10-CM

## 2023-04-20 ENCOUNTER — Other Ambulatory Visit (HOSPITAL_BASED_OUTPATIENT_CLINIC_OR_DEPARTMENT_OTHER): Payer: Self-pay

## 2023-05-07 ENCOUNTER — Telehealth (INDEPENDENT_AMBULATORY_CARE_PROVIDER_SITE_OTHER): Payer: 59 | Admitting: Neurology

## 2023-05-07 DIAGNOSIS — G4733 Obstructive sleep apnea (adult) (pediatric): Secondary | ICD-10-CM | POA: Diagnosis not present

## 2023-05-07 NOTE — Patient Instructions (Signed)
 Keep up the great work on CPAP! We will continue current settings.  Continue nightly use for minimum of 4 hours.  We will follow-up in 1 year virtually.  Thanks!!

## 2023-05-07 NOTE — Progress Notes (Signed)
 Patient: Kathy King Date of Birth: [birth year]  Reason for Visit: Follow up for CPAP History from: Patient Primary Neurologist: [name]   Virtual Visit via Video Note  I connected with Kathy King on 05/07/23 at  9:45 AM EST by a video enabled telemedicine application and verified that I am speaking with the correct person using two identifiers.  Location: Patient: at her work Provider: in the office    I discussed the limitations of evaluation and management by telemedicine and the availability of in person appointments. The patient expressed understanding and agreed to proceed.  ASSESSMENT AND PLAN 68 y.o. year old female   1.  Mild OSA on CPAP -Doing great on CPAP with super compliance, has seen significant benefit since starting CPAP.  Will continue nightly usage for minimum of 4 hours.  We will continue current settings.  She will continue to get supplies from DME as needed.  We will follow-up in 1 year virtually.  HISTORY OF PRESENT ILLNESS: Today 05/07/23 Review of CPAP data shows 93% compliance, 5-12 cm, AHI 0.3, leak 0.  Doing great on CPAP.  Sleeping much better, wakes up feeling rested.  Has had to increase the humidity due to her nose feeling stopped up.  She is motivated to continue CPAP.  Has experienced significant benefit.  She is now working as a CHARITY FUNDRAISER at [employer].  No new issues or concerns. ESS 0.  05/01/22 SS: Here today for initial CPAP visit.  Had in lab sleep study 12/31/21.  Presenting symptoms were insomnia and nonrestorative sleep. Setup date was 02/13/22. Is relaxing better, feeling better, not waking up as much at night Using nasal mask. Works for retail banker, is an CHARITY FUNDRAISER. Feels she's sleep better without a doubt. ESS 6.  Please see attached compliance report in summary data 03/31/22-04/29/22 shows 100% compliance greater than 4 hours.  AHI 0.5.  Leak is 0.6.  IMPRESSION:  1) Sleep disordered breathing was present. Obstructive Sleep  apnea was  seen at mild degree and strongly supine and REM sleep  dependent. snoring was present and hypoxia was not clinically  relevant.     2) Periodic limb movements were not seen.  3) Total sleep time was within normal limits at 403.0 minutes.   Sleep efficiency was decreased at 81.0%.   The overall findings  were that of good sleep architecture, treatment for mild sleep  apnea is generally optional.   RECOMMENDATIONS: auto CPAP ResMed device with a setting to treat  mainly mild OSA and snoring, 5-12 cm water, 1 cm EPR and  interface of patient's comfort. can be nasal pillow with  chinstrap if she likes it.  1) snoring and mild, but strongly REM sleep dependent OSA would  best be treated with a PAP therapy device. I like for the patient  to consider this- 90 days should allow symptom impact under PAP  therapy to be notable if mild apnea is the cause of fatigue.   HISTORY 11/07/21 Dr. 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 Last consultation in [phone].  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 She is worried by increasing insomnia- she falls asleep well, but wakes up easily, a bathroom break can cause a delayed re-initiation of sleep. She doesn't snore loudly,  more of an audible breathing.   When I get home from work and I eat dinner and  go to bed-  I have no strength left to even walk the dog. I take a hot bath or shower, and can go to sleep easily.  She is a water drinker, rarely tea, one cup of coffee in AM.  Never been a drinker or smoker. She has not worked shifts.    REVIEW OF SYSTEMS: Out of a complete 14 system review of symptoms, the patient complains only of the following symptoms, and all other reviewed systems are negative.  See HPI  ALLERGIES: Allergies  Allergen Reactions   Sulfonamide Derivatives      REACTION: unspecified; as child    HOME MEDICATIONS: Outpatient Medications Prior to Visit  Medication Sig Dispense Refill   amitriptyline  (ELAVIL ) 10 MG tablet Take 1 tablet (10 mg total) by mouth at bedtime. 90 tablet 3   Cholecalciferol (VITAMIN D ) 2000 UNITS CAPS Take by mouth.     fluticasone  (FLONASE ) 50 MCG/ACT nasal spray Place 2 sprays into both nostrils daily. 48 g 6   gabapentin  (NEURONTIN ) 300 MG capsule Take 1 capsule (300 mg total) by mouth at bedtime. 90 capsule 1   methylcellulose (CITRUCEL) oral powder Take 1 packet by mouth daily.     traMADol  (ULTRAM ) 50 MG tablet Take 1 tablet (50 mg total) by mouth every 8 (eight) hours as needed. 20 tablet 0   No facility-administered medications prior to visit.    PAST MEDICAL HISTORY: Past Medical History:  Diagnosis Date   Adenomatous colon polyp    tubular   CHICKENPOX, HX OF    Fibromyalgia    OSTEOPENIA    SCIATICA, LEFT 08/09/2009   s/p PT, NSAIDs   Unspecified vitamin D  deficiency     PAST SURGICAL HISTORY: Past Surgical History:  Procedure Laterality Date   Arthroscopic shoulder  2008   CERVICAL FUSION  2006   CESAREAN SECTION  88 & 93   x's 2   TONSILLECTOMY  1965    FAMILY HISTORY: Family History  Problem Relation Age of Onset   Memory loss Mother    Hypertension Mother    Hyperlipidemia Mother    Breast cancer Maternal Grandmother    Seizures Son    Arthritis Other        grandparent   Colon cancer Neg Hx    Esophageal cancer Neg Hx    Rectal cancer Neg Hx    Stomach cancer Neg Hx     SOCIAL HISTORY: Social History   Socioeconomic History   Marital status: Married    Spouse name: Not on file   Number of children: Not on file   Years of education: Not on file   Highest education level: Not on file  Occupational History   Not on file  Tobacco Use   Smoking status: Never   Smokeless tobacco: Never  Vaping Use   Vaping status: Never Used  Substance and Sexual Activity   Alcohol  use: No    Alcohol/week: 0.0 standard drinks of alcohol   Drug use: No   Sexual activity: Not on file  Other Topics Concern   Not on file  Social History Narrative   Married, lives with spouse and kids   Social Drivers of Corporate Investment Banker Strain: Not on file  Food Insecurity: Not on file  Transportation Needs: Not on file  Physical Activity: Not on file  Stress: Not on file  Social Connections: Not on file  Intimate Partner Violence: Not on file   PHYSICAL EXAM  Via video visit, is alert and oriented, speech is clear and concise, facial symmetry noted, moves about freely.  DIAGNOSTIC DATA (LABS, IMAGING, TESTING) - I reviewed patient records, labs, notes, testing and imaging myself where available.  Lab Results  Component Value Date   WBC 5.9 01/01/2023   HGB 13.2 01/01/2023   HCT 41.0 01/01/2023   MCV 92.3 01/01/2023   PLT 246.0 01/01/2023      Component Value Date/Time   NA 140 01/01/2023 0942   K 4.1 01/01/2023 0942   CL 104 01/01/2023 0942   CO2 30 01/01/2023 0942   GLUCOSE 82 01/01/2023 0942   BUN 17 01/01/2023 0942   CREATININE 0.89 01/01/2023 0942   CALCIUM  9.6 01/01/2023 0942   PROT 6.5 01/01/2023 0942   ALBUMIN 4.3 01/01/2023 0942   AST 25 01/01/2023 0942   ALT 30 01/01/2023 0942   ALKPHOS 83 01/01/2023 0942   BILITOT 0.6 01/01/2023 0942   GFRNONAA >60 05/06/2016 1620   GFRAA >60 05/06/2016 1620   Lab Results  Component Value Date   CHOL 227 (H) 01/01/2023   HDL 75.60 01/01/2023   LDLCALC 138 (H) 01/01/2023   TRIG 71.0 01/01/2023   CHOLHDL 3 01/01/2023   Lab Results  Component Value Date   HGBA1C 5.5 01/01/2023   Lab Results  Component Value Date   VITAMINB12 492 12/13/2014   Lab Results  Component Value Date   TSH 2.54 01/01/2023    Lauraine Born, AGNP-C, DNP 05/07/2023, 9:57 AM [facility] [address], [address] [place], [place] [address] [phone]

## 2023-05-08 DIAGNOSIS — H25811 Combined forms of age-related cataract, right eye: Secondary | ICD-10-CM | POA: Diagnosis not present

## 2023-05-20 DIAGNOSIS — H2512 Age-related nuclear cataract, left eye: Secondary | ICD-10-CM | POA: Diagnosis not present

## 2023-05-22 DIAGNOSIS — H25812 Combined forms of age-related cataract, left eye: Secondary | ICD-10-CM | POA: Diagnosis not present

## 2023-05-25 ENCOUNTER — Ambulatory Visit
Admission: RE | Admit: 2023-05-25 | Discharge: 2023-05-25 | Disposition: A | Discharge status: Home / Self Care | Payer: Medicare HMO | Source: Ambulatory Visit | Attending: Obstetrics and Gynecology | Admitting: Obstetrics and Gynecology

## 2023-05-25 DIAGNOSIS — Z1231 Encounter for screening mammogram for malignant neoplasm of breast: Secondary | ICD-10-CM | POA: Diagnosis not present

## 2023-06-04 ENCOUNTER — Encounter: Payer: Self-pay | Admitting: Family Medicine

## 2023-06-04 DIAGNOSIS — H9193 Unspecified hearing loss, bilateral: Secondary | ICD-10-CM

## 2023-06-08 DIAGNOSIS — G4733 Obstructive sleep apnea (adult) (pediatric): Secondary | ICD-10-CM | POA: Diagnosis not present

## 2023-06-17 ENCOUNTER — Other Ambulatory Visit (HOSPITAL_BASED_OUTPATIENT_CLINIC_OR_DEPARTMENT_OTHER): Payer: Self-pay

## 2023-06-17 ENCOUNTER — Other Ambulatory Visit: Payer: Self-pay | Admitting: Family Medicine

## 2023-06-17 MED ORDER — HYDROCODONE BIT-HOMATROP MBR 5-1.5 MG/5ML PO SOLN
5.0000 mL | Freq: Three times a day (TID) | ORAL | 0 refills | Status: DC | PRN
  Filled 2023-06-17: qty 120, 8d supply, fill #0

## 2023-06-18 ENCOUNTER — Other Ambulatory Visit (HOSPITAL_BASED_OUTPATIENT_CLINIC_OR_DEPARTMENT_OTHER): Payer: Self-pay

## 2023-06-18 ENCOUNTER — Ambulatory Visit (INDEPENDENT_AMBULATORY_CARE_PROVIDER_SITE_OTHER): Payer: Medicare HMO

## 2023-06-18 ENCOUNTER — Ambulatory Visit (INDEPENDENT_AMBULATORY_CARE_PROVIDER_SITE_OTHER): Payer: Medicare HMO | Admitting: Family Medicine

## 2023-06-18 ENCOUNTER — Encounter: Payer: Self-pay | Admitting: Family Medicine

## 2023-06-18 VITALS — BP 128/66 | HR 83 | Temp 98.1°F | Resp 18 | Ht 63.0 in | Wt 195.0 lb

## 2023-06-18 DIAGNOSIS — J029 Acute pharyngitis, unspecified: Secondary | ICD-10-CM | POA: Diagnosis not present

## 2023-06-18 DIAGNOSIS — R051 Acute cough: Secondary | ICD-10-CM | POA: Diagnosis not present

## 2023-06-18 DIAGNOSIS — R059 Cough, unspecified: Secondary | ICD-10-CM | POA: Diagnosis not present

## 2023-06-18 LAB — POCT RAPID STREP A (OFFICE): Rapid Strep A Screen: NEGATIVE

## 2023-06-18 MED ORDER — PREDNISONE 20 MG PO TABS
40.0000 mg | ORAL_TABLET | Freq: Every day | ORAL | 0 refills | Status: AC
  Filled 2023-06-18: qty 10, 5d supply, fill #0

## 2023-06-18 NOTE — Telephone Encounter (Signed)
Pt seen by [name] today

## 2023-06-18 NOTE — Progress Notes (Signed)
 Acute Office Visit  Subjective:     Patient ID: Kathy King, female    DOB: Dec 02, 1955, 68 y.o.   MRN: [id number]  Chief Complaint  Patient presents with   chest congestion    X3-4 days, sore throat, chest congestion, denies fever Flu and covid on Monday and yesterday, both negative    HPI Patient is in today for sore throat, congestion.    Discussed the use of AI scribe software for clinical note transcription with the patient, who gave verbal consent to proceed.  History of Present Illness   Kathy King is a 68 year old female who presents with worsening throat discomfort and chest congestion.   She has been experiencing a progressively worsening scratchy sensation in her throat for a few days, accompanied by significant chest congestion. No fever, but she feels drained and very tired. She has a history of pneumonia several years ago, which is causing concern, although she notes the absence of fever this time.  Her grandson, who recently stayed overnight, was diagnosed with croup and strep throat. She tested negative for strep, flu, and COVID-19 twice since feeling poorly.  She has been using Mucinex and Hycodan, which has helped her rest. Her sleep was particularly poor the night before last but improved after taking the cough medicine.  No wheezing, shortness of breath, full body aches, headaches, earaches, or significant nasal symptoms, although her ears feel a little full. She mentions that her symptoms often start in her throat and chest as she has gotten older.           ROS All review of systems negative except what is listed in the HPI      Objective:    BP 128/66   Pulse 83   Temp 98.1 F (36.7 C) (Oral)   Resp 18   Ht 5\' 3"  (1.6 m)   Wt 195 lb (88.5 kg)   SpO2 97%   BMI 34.54 kg/m    Physical Exam Vitals reviewed.  Constitutional:      Appearance: Normal appearance.  HENT:     Head: Normocephalic and atraumatic.      Mouth/Throat:     Mouth: Mucous membranes are moist.     Pharynx: Posterior oropharyngeal erythema present.  Cardiovascular:     Rate and Rhythm: Normal rate and regular rhythm.     Heart sounds: Normal heart sounds.  Pulmonary:     Effort: Pulmonary effort is normal.     Breath sounds: Normal breath sounds. No stridor.     Comments: Tight breath sounds, right side mildly diminished; barky-cough, raspy voice Skin:    General: Skin is warm and dry.  Neurological:     Mental Status: She is alert and oriented to person, place, and time.  Psychiatric:        Mood and Affect: Mood normal.        Behavior: Behavior normal.        Thought Content: Thought content normal.        Judgment: Judgment normal.         Results for orders placed or performed in visit on 06/18/23  POCT rapid strep A  Result Value Ref Range   Rapid Strep A Screen Negative Negative        Assessment & Plan:   Problem List Items Addressed This Visit   None Visit Diagnoses       Acute cough    -  Primary   Relevant  Medications   predniSONE (DELTASONE) 20 MG tablet   Other Relevant Orders   DG Chest 2 View     Sore throat       Relevant Orders   POCT rapid strep A (Completed)     Strep negative.  Likely croup given presentation and recent close exposure to grandson with croup.  Sending in prednisone, continue Hycodan PRN, OTC cough/cold meds, Mucinex, etc.  She reports history of pneumonia and is concerned, but doesn't feel quite as bad as she did at that time. Will order future xray she can get if not improving or starting fever, tachycardia, tachypnea, etc.  Continue supportive measures, rest. Work note provided to be out the rest of this week. Patient aware of signs/symptoms requiring further/urgent evaluation.      Meds ordered this encounter  Medications   predniSONE (DELTASONE) 20 MG tablet    Sig: Take 2 tablets (40 mg total) by mouth daily with breakfast for 5 days.    Dispense:  10  tablet    Refill:  0    Supervising Provider:   Danise Edge A [[id number]]    Return if symptoms worsen or fail to improve.  Clayborne Dana, NP

## 2023-06-23 DIAGNOSIS — Z0100 Encounter for examination of eyes and vision without abnormal findings: Secondary | ICD-10-CM | POA: Diagnosis not present

## 2023-06-24 ENCOUNTER — Other Ambulatory Visit: Payer: Self-pay | Admitting: Family Medicine

## 2023-07-15 ENCOUNTER — Other Ambulatory Visit (HOSPITAL_BASED_OUTPATIENT_CLINIC_OR_DEPARTMENT_OTHER): Payer: Self-pay

## 2023-07-15 ENCOUNTER — Other Ambulatory Visit: Payer: Self-pay | Admitting: Family Medicine

## 2023-07-15 ENCOUNTER — Other Ambulatory Visit: Payer: Self-pay

## 2023-07-15 DIAGNOSIS — M797 Fibromyalgia: Secondary | ICD-10-CM

## 2023-07-15 DIAGNOSIS — M5416 Radiculopathy, lumbar region: Secondary | ICD-10-CM

## 2023-07-15 MED ORDER — GABAPENTIN 300 MG PO CAPS
300.0000 mg | ORAL_CAPSULE | Freq: Every day | ORAL | 1 refills | Status: DC
  Filled 2023-07-15 – 2023-07-17 (×2): qty 90, 90d supply, fill #0
  Filled 2023-10-23: qty 90, 90d supply, fill #1

## 2023-07-17 ENCOUNTER — Other Ambulatory Visit (HOSPITAL_BASED_OUTPATIENT_CLINIC_OR_DEPARTMENT_OTHER): Payer: Self-pay

## 2023-07-20 ENCOUNTER — Other Ambulatory Visit (HOSPITAL_COMMUNITY): Payer: Self-pay

## 2023-07-27 ENCOUNTER — Other Ambulatory Visit (HOSPITAL_COMMUNITY): Payer: Self-pay

## 2023-07-30 NOTE — Progress Notes (Unsigned)
   Rubin Payor, PhD, LAT, ATC acting as a scribe for [name], MD.  Kathy King is a 68 y.o. female who presents to [facility] at [facility] today for exacerbation of her R foot pain. Pt was last seen by Dr. [name] on 03/30/23 and was given a L midfoot dorsal aspect steroid injection and was taught HEP.  Today, pt reports ***  Pertinent review of systems: ***  Relevant historical information: ***   Exam:  There were no vitals taken for this visit. General: Well Developed, well nourished, and in no acute distress.   MSK: ***    Lab and Radiology Results No results found for this or any previous visit (from the past 72 hours). No results found.     Assessment and Plan: 68 y.o. female with ***   PDMP not reviewed this encounter. No orders of the defined types were placed in this encounter.  No orders of the defined types were placed in this encounter.    Discussed warning signs or symptoms. Please see discharge instructions. Patient expresses understanding.   ***

## 2023-07-31 ENCOUNTER — Ambulatory Visit: Admitting: Family Medicine

## 2023-07-31 ENCOUNTER — Encounter: Payer: Self-pay | Admitting: Family Medicine

## 2023-07-31 ENCOUNTER — Other Ambulatory Visit: Payer: Self-pay

## 2023-07-31 VITALS — BP 120/82 | HR 70 | Ht 63.0 in | Wt 198.0 lb

## 2023-07-31 DIAGNOSIS — M79671 Pain in right foot: Secondary | ICD-10-CM

## 2023-07-31 NOTE — Patient Instructions (Addendum)
 Thank you for coming in today.   Please go to [facility] supply to get the CAM Henrietta Dine we talked about today. You may also be able to get it from [facility].    You received an injection today. Seek immediate medical attention if the joint becomes red, extremely painful, or is oozing fluid.   Fleet Feet for pronation control insoles.

## 2023-08-15 ENCOUNTER — Other Ambulatory Visit: Payer: Self-pay | Admitting: Family Medicine

## 2023-08-15 MED ORDER — AZITHROMYCIN 250 MG PO TABS
ORAL_TABLET | ORAL | 0 refills | Status: DC
Start: 2023-08-15 — End: 2023-12-18

## 2023-08-15 MED ORDER — HYDROCODONE BIT-HOMATROP MBR 5-1.5 MG/5ML PO SOLN: 5.0000 mL | Freq: Three times a day (TID) | ORAL | 0 refills | Status: DC | PRN

## 2023-08-18 ENCOUNTER — Other Ambulatory Visit: Payer: Self-pay

## 2023-08-18 ENCOUNTER — Other Ambulatory Visit (HOSPITAL_BASED_OUTPATIENT_CLINIC_OR_DEPARTMENT_OTHER): Payer: Self-pay

## 2023-08-18 MED ORDER — ALBUTEROL SULFATE HFA 108 (90 BASE) MCG/ACT IN AERS
2.0000 | INHALATION_SPRAY | Freq: Four times a day (QID) | RESPIRATORY_TRACT | 2 refills | Status: DC | PRN
  Filled 2023-08-18: qty 6.7, 20d supply, fill #0

## 2023-09-01 DIAGNOSIS — Z124 Encounter for screening for malignant neoplasm of cervix: Secondary | ICD-10-CM | POA: Diagnosis not present

## 2023-09-01 LAB — HM PAP SMEAR

## 2023-10-23 ENCOUNTER — Other Ambulatory Visit (HOSPITAL_BASED_OUTPATIENT_CLINIC_OR_DEPARTMENT_OTHER): Payer: Self-pay

## 2023-11-12 DIAGNOSIS — G4733 Obstructive sleep apnea (adult) (pediatric): Secondary | ICD-10-CM | POA: Diagnosis not present

## 2023-11-17 ENCOUNTER — Other Ambulatory Visit (HOSPITAL_BASED_OUTPATIENT_CLINIC_OR_DEPARTMENT_OTHER): Payer: Self-pay

## 2023-11-19 ENCOUNTER — Encounter: Payer: Self-pay | Admitting: Family Medicine

## 2023-11-19 ENCOUNTER — Other Ambulatory Visit: Payer: Self-pay | Admitting: Family Medicine

## 2023-11-19 ENCOUNTER — Other Ambulatory Visit (HOSPITAL_BASED_OUTPATIENT_CLINIC_OR_DEPARTMENT_OTHER): Payer: Self-pay

## 2023-11-19 DIAGNOSIS — G8929 Other chronic pain: Secondary | ICD-10-CM

## 2023-11-19 DIAGNOSIS — Z7689 Persons encountering health services in other specified circumstances: Secondary | ICD-10-CM

## 2023-11-19 MED ORDER — TRAZODONE HCL 50 MG PO TABS
25.0000 mg | ORAL_TABLET | Freq: Every evening | ORAL | 6 refills | Status: AC | PRN
  Filled 2023-11-19: qty 30, 30d supply, fill #0

## 2023-11-19 MED ORDER — TRAMADOL HCL 50 MG PO TABS
50.0000 mg | ORAL_TABLET | Freq: Three times a day (TID) | ORAL | 0 refills | Status: DC | PRN
  Filled 2023-11-19: qty 20, 7d supply, fill #0

## 2023-11-19 NOTE — Addendum Note (Signed)
 Addended by: WATT RAISIN C on: 11/19/2023 11:57 AM   Modules accepted: Orders

## 2023-12-18 ENCOUNTER — Other Ambulatory Visit (HOSPITAL_BASED_OUTPATIENT_CLINIC_OR_DEPARTMENT_OTHER): Payer: Self-pay

## 2023-12-18 ENCOUNTER — Encounter: Payer: Self-pay | Admitting: Family Medicine

## 2023-12-18 ENCOUNTER — Ambulatory Visit (INDEPENDENT_AMBULATORY_CARE_PROVIDER_SITE_OTHER): Admitting: Family Medicine

## 2023-12-18 VITALS — BP 112/64 | HR 71 | Temp 99.5°F | Resp 16 | Ht 63.0 in | Wt 187.0 lb

## 2023-12-18 DIAGNOSIS — B9689 Other specified bacterial agents as the cause of diseases classified elsewhere: Secondary | ICD-10-CM

## 2023-12-18 DIAGNOSIS — J208 Acute bronchitis due to other specified organisms: Secondary | ICD-10-CM

## 2023-12-18 MED ORDER — BENZONATATE 200 MG PO CAPS
200.0000 mg | ORAL_CAPSULE | Freq: Two times a day (BID) | ORAL | 0 refills | Status: DC | PRN
  Filled 2023-12-18: qty 20, 10d supply, fill #0

## 2023-12-18 MED ORDER — AZITHROMYCIN 250 MG PO TABS
ORAL_TABLET | ORAL | 0 refills | Status: DC
  Filled 2023-12-18: qty 6, 5d supply, fill #0

## 2023-12-18 NOTE — Progress Notes (Signed)
 Chief Complaint  Patient presents with   Cough    Cough    Grayce Reef Coke here for URI complaints.  Duration: 6 days  Associated symptoms: sore throat, wheezing, myalgia, and coughing Denies: sinus congestion, sinus pain, rhinorrhea, itchy watery eyes, ear pain, ear drainage, shortness of breath, and fevers Treatment to date: Mucinex, cough syrup Sick contacts: No Tested - for covid  Past Medical History:  Diagnosis Date   Adenomatous colon polyp    tubular   CHICKENPOX, HX OF    Fibromyalgia    OSTEOPENIA    SCIATICA, LEFT 08/09/2009   s/p PT, NSAIDs   Unspecified vitamin D  deficiency     Objective BP 112/64 (BP Location: Left Arm, Patient Position: Sitting)   Pulse 71   Temp 99.5 F (37.5 C) (Oral)   Resp 16   Ht 5' 3 (1.6 m)   Wt 187 lb (84.8 kg)   SpO2 98%   BMI 33.13 kg/m  General: Awake, alert, appears stated age HEENT: AT, [place], ears patent b/l and TM's neg, nares patent w/o discharge, pharynx pink and without exudates, MMM, no sinus ttp Neck: No masses or asymmetry Heart: RRR Lungs: coarse breath sounds in RL lung field, no accessory muscle use Psych: Age appropriate judgment and insight, normal mood and affect  Acute bacterial bronchitis - Plan: azithromycin  (ZITHROMAX ) 250 MG tablet, benzonatate  (TESSALON ) 200 MG capsule  Tx for bacterial infxn w Zpak. Tessalon  Perles prn. Continue to push fluids, practice good hand hygiene, cover mouth when coughing. F/u prn. If starting to experience fevers, shaking, or shortness of breath, seek immediate care. Pt voiced understanding and agreement to the plan.  Mabel [place], DO 12/18/23 7:07 AM

## 2023-12-18 NOTE — Patient Instructions (Signed)
 Continue to push fluids, practice good hand hygiene, and cover your mouth if you cough.  If you start having fevers, shaking or shortness of breath, seek immediate care.  Send me a message if you need more cough syrup.   Let us  know if you need anything.

## 2023-12-22 ENCOUNTER — Other Ambulatory Visit: Payer: Self-pay | Admitting: Family Medicine

## 2023-12-22 ENCOUNTER — Ambulatory Visit (INDEPENDENT_AMBULATORY_CARE_PROVIDER_SITE_OTHER)

## 2023-12-22 ENCOUNTER — Other Ambulatory Visit (HOSPITAL_BASED_OUTPATIENT_CLINIC_OR_DEPARTMENT_OTHER): Payer: Self-pay

## 2023-12-22 DIAGNOSIS — J208 Acute bronchitis due to other specified organisms: Secondary | ICD-10-CM

## 2023-12-22 DIAGNOSIS — B9689 Other specified bacterial agents as the cause of diseases classified elsewhere: Secondary | ICD-10-CM | POA: Diagnosis not present

## 2023-12-22 MED ORDER — METHYLPREDNISOLONE ACETATE 80 MG/ML IJ SUSP
80.0000 mg | Freq: Once | INTRAMUSCULAR | Status: AC
  Administered 2023-12-22: 80 mg via INTRAMUSCULAR

## 2023-12-22 MED ORDER — HYDROCODONE BIT-HOMATROP MBR 5-1.5 MG/5ML PO SOLN
5.0000 mL | Freq: Three times a day (TID) | ORAL | 0 refills | Status: AC | PRN
  Filled 2023-12-22: qty 120, 8d supply, fill #0

## 2023-12-22 NOTE — Progress Notes (Signed)
 Patient here today for depo 80 IM , per Dr.Wendling, DO .SABRA Patient tolerated injection well

## 2023-12-26 ENCOUNTER — Other Ambulatory Visit: Payer: Self-pay | Admitting: Family Medicine

## 2023-12-26 MED ORDER — ALBUTEROL SULFATE HFA 108 (90 BASE) MCG/ACT IN AERS: 2.0000 | INHALATION_SPRAY | Freq: Four times a day (QID) | RESPIRATORY_TRACT | 0 refills | Status: AC | PRN

## 2023-12-31 ENCOUNTER — Other Ambulatory Visit: Payer: Self-pay | Admitting: Family Medicine

## 2023-12-31 MED ORDER — FLUTICASONE PROPIONATE HFA 110 MCG/ACT IN AERO: INHALATION_SPRAY | RESPIRATORY_TRACT | 12 refills | Status: AC

## 2024-01-06 ENCOUNTER — Other Ambulatory Visit (HOSPITAL_COMMUNITY): Payer: Self-pay

## 2024-01-06 ENCOUNTER — Telehealth: Payer: Self-pay

## 2024-01-06 NOTE — Telephone Encounter (Signed)
 Pharmacy Patient Advocate Encounter   Received notification from Onbase that prior authorization for Fluticasone  110 mcg/act inhaler is required/requested.   Insurance verification completed.   The patient is insured through [employer]   Per test claim:  Arnuity is preferred by the insurance.  If suggested medication is appropriate, Please send in a new RX and discontinue this one. If not, please advise as to why it's not appropriate so that we may request a Prior Authorization. Please note, some preferred medications may still require a PA.  If the suggested medications have not been trialed and there are no contraindications to their use, the PA will not be submitted, as it will not be approved.

## 2024-02-04 ENCOUNTER — Other Ambulatory Visit (HOSPITAL_BASED_OUTPATIENT_CLINIC_OR_DEPARTMENT_OTHER): Payer: Self-pay

## 2024-02-04 ENCOUNTER — Other Ambulatory Visit: Payer: Self-pay | Admitting: Family Medicine

## 2024-02-04 DIAGNOSIS — M5416 Radiculopathy, lumbar region: Secondary | ICD-10-CM

## 2024-02-04 DIAGNOSIS — M797 Fibromyalgia: Secondary | ICD-10-CM

## 2024-02-04 MED ORDER — GABAPENTIN 300 MG PO CAPS
300.0000 mg | ORAL_CAPSULE | Freq: Every day | ORAL | 1 refills | Status: AC
  Filled 2024-02-04: qty 90, 90d supply, fill #0
  Filled 2024-05-02: qty 90, 90d supply, fill #1

## 2024-02-10 DIAGNOSIS — G4733 Obstructive sleep apnea (adult) (pediatric): Secondary | ICD-10-CM | POA: Diagnosis not present

## 2024-02-25 ENCOUNTER — Ambulatory Visit

## 2024-02-26 ENCOUNTER — Ambulatory Visit (INDEPENDENT_AMBULATORY_CARE_PROVIDER_SITE_OTHER)

## 2024-02-26 DIAGNOSIS — Z23 Encounter for immunization: Secondary | ICD-10-CM

## 2024-02-26 NOTE — Progress Notes (Signed)
 Pt here for Prevnar 20 per Dr.Wendling, vaccine given LD Pt tolerated it well.

## 2024-04-11 ENCOUNTER — Other Ambulatory Visit: Payer: Self-pay | Admitting: Family Medicine

## 2024-04-11 DIAGNOSIS — Z1231 Encounter for screening mammogram for malignant neoplasm of breast: Secondary | ICD-10-CM

## 2024-04-16 ENCOUNTER — Other Ambulatory Visit: Payer: Self-pay | Admitting: Family Medicine

## 2024-04-16 MED ORDER — AZITHROMYCIN 250 MG PO TABS: ORAL_TABLET | ORAL | 0 refills | Status: AC

## 2024-04-18 ENCOUNTER — Ambulatory Visit: Admitting: Family Medicine

## 2024-04-18 ENCOUNTER — Encounter: Payer: Self-pay | Admitting: Family Medicine

## 2024-04-18 ENCOUNTER — Telehealth: Payer: Self-pay

## 2024-04-18 VITALS — BP 110/68 | HR 97 | Temp 98.0°F | Resp 16 | Ht 63.0 in | Wt 154.3 lb

## 2024-04-18 DIAGNOSIS — J40 Bronchitis, not specified as acute or chronic: Secondary | ICD-10-CM

## 2024-04-18 DIAGNOSIS — H6993 Unspecified Eustachian tube disorder, bilateral: Secondary | ICD-10-CM | POA: Diagnosis not present

## 2024-04-18 MED ORDER — METHYLPREDNISOLONE ACETATE 80 MG/ML IJ SUSP
80.0000 mg | Freq: Once | INTRAMUSCULAR | Status: AC
  Administered 2024-04-18: 80 mg via INTRAMUSCULAR

## 2024-04-18 NOTE — Addendum Note (Signed)
 Addended by: WELLS LEVORN HERO on: 04/18/2024 12:26 PM   Modules accepted: Orders

## 2024-04-18 NOTE — Telephone Encounter (Signed)
 Patient has requested for Dr.Wendling as her PCP and ask if it's ok with you guys.

## 2024-04-18 NOTE — Patient Instructions (Addendum)
 Continue to push fluids, practice good hand hygiene, and cover your mouth if you cough.  If you start having fevers, shaking or shortness of breath, seek immediate care.  OK to take Tylenol  1000 mg (2 extra strength tabs) or 975 mg (3 regular strength tabs) every 6 hours as needed.  Finish the azithromycin .   If not improving by Wednesday, take   Let us  know if you need anything.

## 2024-04-18 NOTE — Telephone Encounter (Signed)
OK w me.  

## 2024-04-18 NOTE — Progress Notes (Signed)
 Chief Complaint  Patient presents with   Cough    Cough and Congestion    Kathy King here for URI complaints.  Duration: 3 days  Associated symptoms: sinus congestion, rhinorrhea, ear pain, sore throat, and coughing Denies: sinus pain, itchy watery eyes, ear drainage, sore throat, wheezing, shortness of breath, myalgia, and fevers, N/V/D Treatment to date: Zpak Sick contacts: No  Past Medical History:  Diagnosis Date   Adenomatous colon polyp    tubular   CHICKENPOX, HX OF    Fibromyalgia    OSTEOPENIA    SCIATICA, LEFT 08/09/2009   s/p PT, NSAIDs   Unspecified vitamin D  deficiency     Objective BP 110/68 (BP Location: Left Arm, Patient Position: Sitting)   Pulse 97   Temp 98 F (36.7 C) (Oral)   Resp 16   Ht 5' 3 (1.6 m)   Wt 154 lb 4.8 oz (70 kg)   SpO2 98%   BMI 27.33 kg/m  General: Awake, alert, appears stated age HEENT: AT, [place], ears patent b/l and TM's retracted b/l, nares patent w/o discharge, pharynx pink and without exudates, MMM, no sinus ttp Neck: No masses or asymmetry Heart: RRR Lungs: CTAB, no accessory muscle use Psych: Age appropriate judgment and insight, normal mood and affect  Dysfunction of both eustachian tubes  Bronchitis  Depomedrol injection today. Hx of PIC. She has Trelegy at home which she will start using again in 2 d if Depo not helpful. Continue to push fluids, practice good hand hygiene, cover mouth when coughing. F/u prn. If starting to experience fevers, shaking, or shortness of breath, seek immediate care. Pt voiced understanding and agreement to the plan.  Mabel Mt [place], DO 04/18/2024 12:09 PM

## 2024-05-10 NOTE — Progress Notes (Unsigned)
 SABRA

## 2024-05-11 ENCOUNTER — Telehealth: Payer: Medicare HMO | Admitting: Neurology

## 2024-05-11 DIAGNOSIS — G4733 Obstructive sleep apnea (adult) (pediatric): Secondary | ICD-10-CM | POA: Diagnosis not present

## 2024-05-11 NOTE — Patient Instructions (Signed)
 Great to see you today! Continue CPAP usage minimum 4 hours nightly Continue current settings Continue to replace supplies routinely through DME Follow-up in 1 year or sooner if needed. Thanks!!

## 2024-05-11 NOTE — Progress Notes (Signed)
 "  Patient: Kathy King Date of Birth: [birth year]  Reason for Visit: Follow up for CPAP History from: Patient Primary Neurologist: [name]   Virtual Visit via Video Note  I connected with Kathy King on 05/11/2024 at  2:15 PM EST by a video enabled telemedicine application and verified that I am speaking with the correct person using two identifiers.  Location: Patient: at her work Provider: in the office    I discussed the limitations of evaluation and management by telemedicine and the availability of in person appointments. The patient expressed understanding and agreed to proceed.  ASSESSMENT AND PLAN 69 y.o. year old female   1.  Mild OSA on CPAP (Had in lab sleep study 12/31/21.  Presenting symptoms were insomnia and nonrestorative sleep. Setup date was 02/13/22)  -Excellent benefit with CPAP.  Excellent compliance.  Continue nightly usage minimum 4 hours.  She has lost 45 pounds since initial sleep testing.  We discussed ordering HST.  For now, wishes to continue CPAP, has significant benefit from CPAP.  Follow-up in 1 year virtually  HISTORY OF PRESENT ILLNESS: Today 05/11/2024 05/11/24 SS: VV, CPAP report shows 93% usage greater than 4 hours.  AHI 0.2, leak 4.1. please with CPAP. Feels like sleeps well, well rested during the day. 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 Has lost weight, 45 lbs since July eating better, exercising. With CPAP feels sleep is more restorative.   05/07/23 SS: Review of CPAP data shows 93% compliance, 5-12 cm, AHI 0.3, leak 0.  Doing great on CPAP.  Sleeping much better, wakes up feeling rested.  Has had to increase the humidity due to her nose feeling stopped up.  She is motivated to continue CPAP.  Has experienced significant benefit.  She is now working as a [employer] at [employer].  No new issues or concerns. ESS 0.  05/01/22 SS: Here today for initial CPAP visit.  Had in lab sleep study 12/31/21.  Presenting symptoms were insomnia and  nonrestorative sleep. Setup date was 02/13/22. Is relaxing better, feeling better, not waking up as much at night Using nasal mask. Works for retail banker, is an [employer]. Feels she's sleep better without a doubt. ESS 6.  Please see attached compliance report in summary data 03/31/22-04/29/22 shows 100% compliance greater than 4 hours.  AHI 0.5.  Leak is 0.6.  IMPRESSION:  1) Sleep disordered breathing was present. Obstructive Sleep  apnea was seen at mild degree and strongly supine and REM sleep  dependent. snoring was present and hypoxia was not clinically  relevant.     2) Periodic limb movements were not seen.  3) Total sleep time was within normal limits at 403.0 minutes.   Sleep efficiency was decreased at 81.0%.   The overall findings  were that of good sleep architecture, treatment for mild sleep  apnea is generally optional.   RECOMMENDATIONS: auto CPAP ResMed device with a setting to treat  mainly mild OSA and snoring, 5-12 cm water, 1 cm EPR and  interface of patient's comfort. can be nasal pillow with  chinstrap if she likes it.  1) snoring and mild, but strongly REM sleep dependent OSA would  best be treated with a PAP therapy device. I like for the patient  to consider this- 90 days should allow symptom impact under PAP  therapy to be notable if mild apnea is the cause of fatigue.   HISTORY 11/07/21 Dr. 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 Last consultation in [phone].  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 She is worried by increasing insomnia- she falls asleep well, but wakes up easily, a bathroom break can cause a delayed re-initiation of sleep. She doesn't snore loudly, more of an audible breathing.   When I get home from work and I eat dinner and  go to bed-  I have no strength left to even walk  the dog. I take a hot bath or shower, and can go to sleep easily.  She is a water drinker, rarely tea, one cup of coffee in AM.  Never been a drinker or smoker. She has not worked shifts.    REVIEW OF SYSTEMS: Out of a complete 14 system review of symptoms, the patient complains only of the following symptoms, and all other reviewed systems are negative.  See HPI  ALLERGIES: Allergies  Allergen Reactions   Sulfonamide Derivatives     REACTION: unspecified; as child    HOME MEDICATIONS: Outpatient Medications Prior to Visit  Medication Sig Dispense Refill   albuterol  (VENTOLIN  HFA) 108 (90 Base) MCG/ACT inhaler Inhale 2 puffs into the lungs every 6 (six) hours as needed for wheezing or shortness of breath. 8 g 0   azithromycin  (ZITHROMAX ) 250 MG tablet Take 2 tabs the first day and then 1 tab daily until you run out. 6 tablet 0   Cholecalciferol (VITAMIN D ) 2000 UNITS CAPS Take by mouth.     fluticasone  (FLONASE ) 50 MCG/ACT nasal spray Place 2 sprays into both nostrils daily. 48 g 6   fluticasone  (FLOVENT  HFA) 110 MCG/ACT inhaler 2 puffs twice daily, rinse mouth out after use. 1 each 12   gabapentin  (NEURONTIN ) 300 MG capsule Take 1 capsule (300 mg total) by mouth at bedtime. 90 capsule 1   HYDROcodone  bit-homatropine (HYCODAN) 5-1.5 MG/5ML syrup Take 5 mLs by mouth every 8 (eight) hours as needed for cough. 120 mL 0   traZODone  (DESYREL ) 50 MG tablet Take 0.5-1 tablets (25-50 mg total) by mouth at bedtime as needed for sleep. 30 tablet 6   No facility-administered medications prior to visit.    PAST MEDICAL HISTORY: Past Medical History:  Diagnosis Date   Adenomatous colon polyp    tubular   CHICKENPOX, HX OF    Fibromyalgia    OSTEOPENIA    SCIATICA, LEFT 08/09/2009   s/p PT, NSAIDs   Unspecified vitamin D  deficiency     PAST SURGICAL HISTORY: Past Surgical History:  Procedure Laterality Date   Arthroscopic shoulder  2008   CERVICAL FUSION  2006   CESAREAN SECTION   89 & 89   x's 2   TONSILLECTOMY  1965    FAMILY HISTORY: Family History  Problem Relation Age of Onset   Memory loss Mother    Hypertension Mother    Hyperlipidemia Mother    Breast cancer Maternal Grandmother    Seizures Son    Arthritis Other        grandparent   Colon cancer Neg Hx    Esophageal cancer Neg Hx    Rectal cancer Neg Hx    Stomach cancer Neg Hx     SOCIAL HISTORY: Social History   Socioeconomic History   Marital status: Married    Spouse name: Not on file  Number of children: Not on file   Years of education: Not on file   Highest education level: Not on file  Occupational History   Not on file  Tobacco Use   Smoking status: Never   Smokeless tobacco: Never  Vaping Use   Vaping status: Never Used  Substance and Sexual Activity   Alcohol use: No    Alcohol/week: 0.0 standard drinks of alcohol   Drug use: No   Sexual activity: Not on file  Other Topics Concern   Not on file  Social History Narrative   Married, lives with spouse and kids   Social Drivers of Health   Tobacco Use: Low Risk (04/18/2024)   Patient History    Smoking Tobacco Use: Never    Smokeless Tobacco Use: Never    Passive Exposure: Not on file  Financial Resource Strain: Not on file  Food Insecurity: Not on file  Transportation Needs: Not on file  Physical Activity: Not on file  Stress: Not on file  Social Connections: Not on file  Intimate Partner Violence: Not on file  Depression (PHQ2-9): Low Risk (04/18/2024)   Depression (PHQ2-9)    PHQ-2 Score: 0  Alcohol Screen: Not on file  Housing: Not on file  Utilities: Not on file  Health Literacy: Not on file   PHYSICAL EXAM  Via video visit  DIAGNOSTIC DATA (LABS, IMAGING, TESTING) - I reviewed patient records, labs, notes, testing and imaging myself where available.  Lab Results  Component Value Date   WBC 5.9 01/01/2023   HGB 13.2 01/01/2023   HCT 41.0 01/01/2023   MCV 92.3 01/01/2023   PLT 246.0  01/01/2023      Component Value Date/Time   NA 140 01/01/2023 0942   K 4.1 01/01/2023 0942   CL 104 01/01/2023 0942   CO2 30 01/01/2023 0942   GLUCOSE 82 01/01/2023 0942   BUN 17 01/01/2023 0942   CREATININE 0.89 01/01/2023 0942   CALCIUM  9.6 01/01/2023 0942   PROT 6.5 01/01/2023 0942   ALBUMIN 4.3 01/01/2023 0942   AST 25 01/01/2023 0942   ALT 30 01/01/2023 0942   ALKPHOS 83 01/01/2023 0942   BILITOT 0.6 01/01/2023 0942   GFRNONAA >60 05/06/2016 1620   GFRAA >60 05/06/2016 1620   Lab Results  Component Value Date   CHOL 227 (H) 01/01/2023   HDL 75.60 01/01/2023   LDLCALC 138 (H) 01/01/2023   TRIG 71.0 01/01/2023   CHOLHDL 3 01/01/2023   Lab Results  Component Value Date   HGBA1C 5.5 01/01/2023   Lab Results  Component Value Date   VITAMINB12 492 12/13/2014   Lab Results  Component Value Date   TSH 2.54 01/01/2023    Lauraine Born, AGNP-C, DNP 05/11/2024, 2:22 PM [facility] [address], [address] [place], [place] [address] [phone]   "

## 2024-05-16 NOTE — Progress Notes (Unsigned)
"       ° °  LILLETTE Ileana Collet, PhD, LAT, ATC acting as a scribe for Artist Lloyd, MD.  Kathy King is a 69 y.o. female who presents to [facility] at [facility] today for exacerbation of her L foot pain. Pt was last seen for her L foot on 03/30/23 and was given a L midfoot dorsal aspect steroid injection and was taught HEP.  Today, pt reports ***  Dx imaging: 03/02/23 L foot XR   Pertinent review of systems: ***  Relevant historical information: ***   Exam:  There were no vitals taken for this visit. General: Well Developed, well nourished, and in no acute distress.   MSK: ***    Lab and Radiology Results No results found for this or any previous visit (from the past 72 hours). No results found.     Assessment and Plan: 69 y.o. female with ***   PDMP not reviewed this encounter. No orders of the defined types were placed in this encounter.  No orders of the defined types were placed in this encounter.    Discussed warning signs or symptoms. Please see discharge instructions. Patient expresses understanding.   ***  "

## 2024-05-17 ENCOUNTER — Ambulatory Visit

## 2024-05-17 ENCOUNTER — Encounter: Payer: Self-pay | Admitting: Family Medicine

## 2024-05-17 ENCOUNTER — Other Ambulatory Visit (HOSPITAL_BASED_OUTPATIENT_CLINIC_OR_DEPARTMENT_OTHER): Payer: Self-pay

## 2024-05-17 ENCOUNTER — Ambulatory Visit: Admitting: Family Medicine

## 2024-05-17 ENCOUNTER — Other Ambulatory Visit: Payer: Self-pay

## 2024-05-17 VITALS — BP 110/70 | HR 64 | Ht 63.0 in | Wt 151.0 lb

## 2024-05-17 DIAGNOSIS — M79672 Pain in left foot: Secondary | ICD-10-CM

## 2024-05-17 NOTE — Patient Instructions (Signed)
 Thank you for coming in today.   Turf toe 1/2 length steel insoles.  [employer] store Turf Toe - Half Steel Insoles    Please get an Xray today before you leave   Please use Voltaren  gel (Generic Diclofenac  Gel) up to 4x daily for pain as needed.  This is available over-the-counter as both the name brand Voltaren  gel and the generic diclofenac  gel.   If not better next step could be injections. Let me know.

## 2024-05-18 ENCOUNTER — Encounter: Payer: Self-pay | Admitting: Family Medicine

## 2024-05-18 ENCOUNTER — Other Ambulatory Visit: Payer: Self-pay

## 2024-05-18 MED ORDER — DICLOFENAC SODIUM 1 % EX GEL: CUTANEOUS | Status: AC

## 2024-05-24 ENCOUNTER — Ambulatory Visit: Payer: Self-pay | Admitting: Family Medicine

## 2024-05-24 NOTE — Progress Notes (Signed)
 Left foot x-ray shows a heel spur but otherwise looks okay

## 2024-05-25 ENCOUNTER — Ambulatory Visit
Admission: RE | Admit: 2024-05-25 | Discharge: 2024-05-25 | Disposition: A | Source: Ambulatory Visit | Attending: Family Medicine | Admitting: Family Medicine

## 2024-05-25 DIAGNOSIS — Z1231 Encounter for screening mammogram for malignant neoplasm of breast: Secondary | ICD-10-CM

## 2024-06-06 ENCOUNTER — Ambulatory Visit: Admitting: Podiatry

## 2025-05-17 ENCOUNTER — Telehealth: Admitting: Neurology
